# Patient Record
Sex: Female | Born: 1990 | Race: White | Hispanic: No | Marital: Single | Smoking: Never smoker
Health system: Southern US, Community
[De-identification: ages and names within clinical notes are randomized; demographics above are authoritative.]

## PROBLEM LIST (undated history)

## (undated) DIAGNOSIS — R945 Abnormal results of liver function studies: Secondary | ICD-10-CM

## (undated) DIAGNOSIS — K589 Irritable bowel syndrome without diarrhea: Secondary | ICD-10-CM

## (undated) DIAGNOSIS — M419 Scoliosis, unspecified: Secondary | ICD-10-CM

## (undated) DIAGNOSIS — J45909 Unspecified asthma, uncomplicated: Secondary | ICD-10-CM

## (undated) DIAGNOSIS — K859 Acute pancreatitis without necrosis or infection, unspecified: Secondary | ICD-10-CM

## (undated) DIAGNOSIS — K8020 Calculus of gallbladder without cholecystitis without obstruction: Secondary | ICD-10-CM

## (undated) DIAGNOSIS — R7989 Other specified abnormal findings of blood chemistry: Secondary | ICD-10-CM

## (undated) DIAGNOSIS — T7840XA Allergy, unspecified, initial encounter: Secondary | ICD-10-CM

## (undated) HISTORY — DX: Irritable bowel syndrome, unspecified: K58.9

## (undated) HISTORY — DX: Acute pancreatitis without necrosis or infection, unspecified: K85.90

## (undated) HISTORY — DX: Calculus of gallbladder without cholecystitis without obstruction: K80.20

## (undated) HISTORY — DX: Abnormal results of liver function studies: R94.5

## (undated) HISTORY — DX: Other specified abnormal findings of blood chemistry: R79.89

## (undated) HISTORY — PX: MOLE REMOVAL: SHX2046

## (undated) HISTORY — DX: Unspecified asthma, uncomplicated: J45.909

## (undated) HISTORY — DX: Allergy, unspecified, initial encounter: T78.40XA

## (undated) HISTORY — DX: Scoliosis, unspecified: M41.9

---

## 2016-11-14 ENCOUNTER — Encounter: Payer: Self-pay | Admitting: Nurse Practitioner

## 2016-12-05 ENCOUNTER — Other Ambulatory Visit (INDEPENDENT_AMBULATORY_CARE_PROVIDER_SITE_OTHER): Payer: Managed Care, Other (non HMO)

## 2016-12-05 ENCOUNTER — Encounter: Payer: Self-pay | Admitting: Nurse Practitioner

## 2016-12-05 ENCOUNTER — Ambulatory Visit (INDEPENDENT_AMBULATORY_CARE_PROVIDER_SITE_OTHER): Payer: Managed Care, Other (non HMO) | Admitting: Nurse Practitioner

## 2016-12-05 ENCOUNTER — Encounter (INDEPENDENT_AMBULATORY_CARE_PROVIDER_SITE_OTHER): Payer: Self-pay

## 2016-12-05 VITALS — BP 100/50 | HR 72 | Ht 64.0 in | Wt 133.2 lb

## 2016-12-05 DIAGNOSIS — R079 Chest pain, unspecified: Secondary | ICD-10-CM

## 2016-12-05 DIAGNOSIS — R1013 Epigastric pain: Secondary | ICD-10-CM | POA: Diagnosis not present

## 2016-12-05 DIAGNOSIS — K589 Irritable bowel syndrome without diarrhea: Secondary | ICD-10-CM

## 2016-12-05 LAB — HEPATIC FUNCTION PANEL
ALBUMIN: 4 g/dL (ref 3.5–5.2)
ALT: 29 U/L (ref 0–35)
AST: 16 U/L (ref 0–37)
Alkaline Phosphatase: 41 U/L (ref 39–117)
BILIRUBIN TOTAL: 0.3 mg/dL (ref 0.2–1.2)
Bilirubin, Direct: 0.1 mg/dL (ref 0.0–0.3)
Total Protein: 6.9 g/dL (ref 6.0–8.3)

## 2016-12-05 LAB — CBC WITH DIFFERENTIAL/PLATELET
Basophils Absolute: 0 10*3/uL (ref 0.0–0.1)
Basophils Relative: 0.4 % (ref 0.0–3.0)
EOS PCT: 5.7 % — AB (ref 0.0–5.0)
Eosinophils Absolute: 0.5 10*3/uL (ref 0.0–0.7)
HCT: 37 % (ref 36.0–46.0)
HEMOGLOBIN: 12.5 g/dL (ref 12.0–15.0)
LYMPHS PCT: 25.5 % (ref 12.0–46.0)
Lymphs Abs: 2.4 10*3/uL (ref 0.7–4.0)
MCHC: 33.7 g/dL (ref 30.0–36.0)
MCV: 90 fl (ref 78.0–100.0)
MONOS PCT: 8.5 % (ref 3.0–12.0)
Monocytes Absolute: 0.8 10*3/uL (ref 0.1–1.0)
Neutro Abs: 5.6 10*3/uL (ref 1.4–7.7)
Neutrophils Relative %: 59.9 % (ref 43.0–77.0)
Platelets: 261 10*3/uL (ref 150.0–400.0)
RBC: 4.11 Mil/uL (ref 3.87–5.11)
RDW: 12.6 % (ref 11.5–15.5)
WBC: 9.4 10*3/uL (ref 4.0–10.5)

## 2016-12-05 LAB — LIPASE: Lipase: 31 U/L (ref 11.0–59.0)

## 2016-12-05 MED ORDER — RANITIDINE HCL 150 MG PO TABS: 150.0000 mg | ORAL_TABLET | ORAL | 5 refills | Status: DC

## 2016-12-05 NOTE — Progress Notes (Signed)
HPI:  Sierra Hernandez is a 26 year old female who moved here from [place]. Patient is here for 2 months of upper abdominal/distal chest discomfort associated with nausea and vomiting. She's had about 5 episodes in the last 2 months. Episodes  postprandial, worse with fatty foods. The pain does not radiate,  Episodes lasts up to 2 hours. Her last episode was last Sunday after eating [employer]. Patient took Miralax in elementary school. When her upper IGI symptoms started a couple of months ago patient called her mother who advised she try Miralax.  She has actually felt better but doesn't really know if it is because of the MiraLAX. She takes Aleve but sparingly. Her weight is Hernandez. No family history of gastrointestinal or liver diseases.  Sierra Hernandez was seen by GI in [place] in 2012 for marketed transaminitis, fatigue, joint and muscle aches. Peak AST 263, AST 492. Alk phos, bilirubin were normal. Patient stopped taking minocycline and Suliden for acne and her enzymes improved within 3 months, normalized within 6 months. Enzymes still normal in 2014 but mildly elevated on routine physical 2016. Referred back to GI , saw by Dr. Annette Hernandez with [facility].  Ceruloplasmin mildly elevated at 46 (upper limit of normal 39), AMA positive 1:180 homogenous pattern. Remaining liver studies unremarkable including ferritin, alpha-1 antitrypsin, HBsAg, HCV ab,  AMA, ASMA. Ultrasound showed cholelithiasis.  Referral to Rheumatology was entertained for joint pains and elevated ANA but her symptoms resolved LFTs normalized. .    Past Medical History:  Diagnosis Date  . Asthma   . Elevated LFTs   . Gallstones   . IBS (irritable bowel syndrome)   . Scoliosis     Past Surgical History:  Procedure Laterality Date  . MOLE REMOVAL     x 2 on back   Family History  Problem Relation Age of Onset  . Melanoma Mother   . Hyperlipidemia Father   . Heart disease Maternal  Grandmother   . Arthritis Maternal Grandmother   . Gallstones Maternal Grandfather   . Arthritis Paternal Grandmother   . Thyroid cancer Paternal Grandfather   . Colon cancer Neg Hx   . Stomach cancer Neg Hx   . Pancreatic cancer Neg Hx   . Liver disease Neg Hx    Social History  Substance Use Topics  . Smoking status: Never Smoker  . Smokeless tobacco: Never Used  . Alcohol use Yes     Comment: 1 drink daily   Current Outpatient Prescriptions  Medication Sig Dispense Refill  . cetirizine (ZYRTEC) 10 MG tablet Take 10 mg by mouth daily.    . fluticasone (FLONASE) 50 MCG/ACT nasal spray Place 1 spray into both nostrils daily.    . naproxen sodium (ALEVE) 220 MG tablet Take 220 mg by mouth as needed.    . norgestimate-ethinyl estradiol (ORTHO-CYCLEN,SPRINTEC,PREVIFEM) 0.25-35 MG-MCG tablet Take 1 tablet by mouth daily.    Marland Kitchen POLYETHYLENE GLYCOL 3350 PO Take 1/2 capful daily     No current facility-administered medications for this visit.    Allergies not on file   Review of Systems: Positive for allergy, sinus trouble.  All other systems reviewed and negative except where noted in HPI.    Physical Exam: Ht '5\' 4"'  (1.626 m)   Wt 133 lb 3.2 oz (60.4 kg)   BMI 22.86 kg/m  Constitutional:  Well-developed, white female in no acute distress. Psychiatric: Normal mood and affect. Behavior is normal. EENT: Pupils normal.  Conjunctivae are normal.  No scleral icterus. Neck supple.  Cardiovascular: Normal rate, regular rhythm. No edema Pulmonary/chest: Effort normal and breath sounds normal. No wheezing, rales or rhonchi. Abdominal: Soft, nondistended. Nontender. Bowel sounds active throughout. There are no masses palpable. No hepatomegaly. Lymphadenopathy: No cervical adenopathy noted. Neurological: Alert and oriented to person place and time. Skin: Skin is warm and dry. No rashes noted.   ASSESSMENT AND PLAN:  1. 25 yo female with two-month history of intermittent,  non-radiating epigastric/distal chest discomfort associated with a couple of episodes of vomiting but mainly just nausea. Symptoms mainly postprandial, worse after fatty food intake. She feels fine in between episodes. Ultrasound in 2016 revealed cholelithiasis.  Axon looks okay, abdominal exam is unremarkable. Will obtain CMET, lipase, CBC.  -Trial of Zantac Q am.  -Episodic pain / nausea persists then consider EGD and / or surgical referral  2. History of abnormal transaminases in 2014, worked up extensively in [place]. She had a + ANA, also mildly elevated Ceruloplasmin but remainder of labs unremarkable and LFTs normalized. Cholelithiasis on ultrasound but history and exam wasn't compatible with biliary pain at the time.  -Await LFTs being drawn today  Sierra Sav[place], NP  12/05/2016, 8:49 AM

## 2016-12-05 NOTE — Patient Instructions (Addendum)
If you are age 25 or older, your body mass index should be between 23-30. Your Body mass index is 22.86 kg/m. If this is out of the aforementioned range listed, please consider follow up with your Primary Care Provider.  If you are age 25 or younger, your body mass index should be between 19-25. Your Body mass index is 22.86 kg/m. If this is out of the aformentioned range listed, please consider follow up with your Primary Care Provider.   Your physician has requested that you go to the basement for the following lab work before leaving today: CBC/w diff Lipase Hepatic Function  We have sent the following medications to your pharmacy for you to pick up at your convenience: Zantac  Continue Miralax daily.  Follow up with Willette ClusterPaula Guenther, NP in 3-4 weeks or sooner if ongoing pain.  Please call the office to set up appointment in 3-4 weeks when schedule is available.  Thank you for choosing me and [facility].   Willette ClusterPaula Guenther, NP

## 2016-12-08 NOTE — Progress Notes (Signed)
Excellent consultation note reviewed. This sounds like symptomatic gallbladder disease. Prior liver test elevations may have been transient if she passed a CBD stone. Any event, please arrange for surgical opinion as I think she will benefit from laparoscopic cholecystectomy. Thank you

## 2016-12-11 ENCOUNTER — Telehealth: Payer: Self-pay

## 2016-12-11 NOTE — Telephone Encounter (Signed)
-----   Message from Paula M Guenther, NP sent at 12/09/2016 12:10 AM EDT ----- Beth, please call and see how Citlalic is doing . Dr. Perry agrees that a surgical eval for pain and gallstones is next step.. Can you refer patient and then let her know. Thanks 

## 2016-12-11 NOTE — Telephone Encounter (Signed)
Spoke with pt and she is aware.

## 2016-12-11 NOTE — Telephone Encounter (Signed)
Left message for pt to call back. Records faxed to [facility] for referral.

## 2016-12-12 ENCOUNTER — Telehealth: Payer: Self-pay

## 2016-12-12 NOTE — Telephone Encounter (Signed)
-----   Message from Meredith PelPaula M Guenther, NP sent at 12/09/2016 12:10 AM EDT ----- Sierra Hernandez, please call and see how Sierra Hernandez is doing . Dr. Marina GoodellPerry agrees that a surgical eval for pain and gallstones is next step.. Can you refer patient and then let her know. Thanks

## 2016-12-12 NOTE — Telephone Encounter (Signed)
Pt feeling ok, referred to [facility] for consideration of gallbladder surgery. She knows to expect a call from their office.

## 2016-12-20 ENCOUNTER — Telehealth: Payer: Self-pay

## 2016-12-20 NOTE — Telephone Encounter (Signed)
[facility] appointment with Dr. Dwain SarnaWakefield on 01/02/17 at 9:10, they left message for patient.

## 2017-01-27 DIAGNOSIS — K859 Acute pancreatitis without necrosis or infection, unspecified: Secondary | ICD-10-CM

## 2017-01-27 HISTORY — DX: Acute pancreatitis without necrosis or infection, unspecified: K85.90

## 2017-02-03 ENCOUNTER — Inpatient Hospital Stay (HOSPITAL_COMMUNITY)
Admission: EM | Admit: 2017-02-03 | Discharge: 2017-02-19 | DRG: 417 | Disposition: A | Discharge status: Home / Self Care | Payer: Managed Care, Other (non HMO) | Attending: Internal Medicine | Admitting: Internal Medicine

## 2017-02-03 ENCOUNTER — Encounter (HOSPITAL_COMMUNITY): Payer: Self-pay

## 2017-02-03 DIAGNOSIS — M311 Thrombotic microangiopathy: Secondary | ICD-10-CM | POA: Diagnosis not present

## 2017-02-03 DIAGNOSIS — Z8379 Family history of other diseases of the digestive system: Secondary | ICD-10-CM

## 2017-02-03 DIAGNOSIS — Y848 Other medical procedures as the cause of abnormal reaction of the patient, or of later complication, without mention of misadventure at the time of the procedure: Secondary | ICD-10-CM | POA: Diagnosis not present

## 2017-02-03 DIAGNOSIS — K8062 Calculus of gallbladder and bile duct with acute cholecystitis without obstruction: Secondary | ICD-10-CM | POA: Diagnosis not present

## 2017-02-03 DIAGNOSIS — M7989 Other specified soft tissue disorders: Secondary | ICD-10-CM | POA: Diagnosis not present

## 2017-02-03 DIAGNOSIS — K567 Ileus, unspecified: Secondary | ICD-10-CM | POA: Diagnosis not present

## 2017-02-03 DIAGNOSIS — K219 Gastro-esophageal reflux disease without esophagitis: Secondary | ICD-10-CM | POA: Diagnosis present

## 2017-02-03 DIAGNOSIS — L299 Pruritus, unspecified: Secondary | ICD-10-CM | POA: Diagnosis not present

## 2017-02-03 DIAGNOSIS — J45909 Unspecified asthma, uncomplicated: Secondary | ICD-10-CM | POA: Diagnosis not present

## 2017-02-03 DIAGNOSIS — K859 Acute pancreatitis without necrosis or infection, unspecified: Secondary | ICD-10-CM | POA: Diagnosis not present

## 2017-02-03 DIAGNOSIS — R600 Localized edema: Secondary | ICD-10-CM | POA: Diagnosis not present

## 2017-02-03 DIAGNOSIS — Z6821 Body mass index (BMI) 21.0-21.9, adult: Secondary | ICD-10-CM

## 2017-02-03 DIAGNOSIS — R739 Hyperglycemia, unspecified: Secondary | ICD-10-CM | POA: Diagnosis present

## 2017-02-03 DIAGNOSIS — L709 Acne, unspecified: Secondary | ICD-10-CM | POA: Diagnosis not present

## 2017-02-03 DIAGNOSIS — Z8261 Family history of arthritis: Secondary | ICD-10-CM

## 2017-02-03 DIAGNOSIS — Z419 Encounter for procedure for purposes other than remedying health state, unspecified: Secondary | ICD-10-CM

## 2017-02-03 DIAGNOSIS — Z808 Family history of malignant neoplasm of other organs or systems: Secondary | ICD-10-CM

## 2017-02-03 DIAGNOSIS — Z881 Allergy status to other antibiotic agents status: Secondary | ICD-10-CM | POA: Diagnosis not present

## 2017-02-03 DIAGNOSIS — K8066 Calculus of gallbladder and bile duct with acute and chronic cholecystitis without obstruction: Principal | ICD-10-CM | POA: Diagnosis present

## 2017-02-03 DIAGNOSIS — R7989 Other specified abnormal findings of blood chemistry: Secondary | ICD-10-CM

## 2017-02-03 DIAGNOSIS — K9189 Other postprocedural complications and disorders of digestive system: Secondary | ICD-10-CM | POA: Diagnosis not present

## 2017-02-03 DIAGNOSIS — R188 Other ascites: Secondary | ICD-10-CM

## 2017-02-03 DIAGNOSIS — R1011 Right upper quadrant pain: Secondary | ICD-10-CM

## 2017-02-03 DIAGNOSIS — R945 Abnormal results of liver function studies: Secondary | ICD-10-CM | POA: Diagnosis present

## 2017-02-03 DIAGNOSIS — K8050 Calculus of bile duct without cholangitis or cholecystitis without obstruction: Secondary | ICD-10-CM

## 2017-02-03 DIAGNOSIS — Z8349 Family history of other endocrine, nutritional and metabolic diseases: Secondary | ICD-10-CM

## 2017-02-03 DIAGNOSIS — Z8489 Family history of other specified conditions: Secondary | ICD-10-CM | POA: Diagnosis not present

## 2017-02-03 DIAGNOSIS — R112 Nausea with vomiting, unspecified: Secondary | ICD-10-CM

## 2017-02-03 DIAGNOSIS — R3129 Other microscopic hematuria: Secondary | ICD-10-CM | POA: Diagnosis not present

## 2017-02-03 DIAGNOSIS — Z8249 Family history of ischemic heart disease and other diseases of the circulatory system: Secondary | ICD-10-CM | POA: Diagnosis not present

## 2017-02-03 DIAGNOSIS — G4700 Insomnia, unspecified: Secondary | ICD-10-CM | POA: Diagnosis not present

## 2017-02-03 DIAGNOSIS — Z79899 Other long term (current) drug therapy: Secondary | ICD-10-CM

## 2017-02-03 DIAGNOSIS — K659 Peritonitis, unspecified: Secondary | ICD-10-CM | POA: Diagnosis not present

## 2017-02-03 DIAGNOSIS — K589 Irritable bowel syndrome without diarrhea: Secondary | ICD-10-CM | POA: Diagnosis present

## 2017-02-03 DIAGNOSIS — E8770 Fluid overload, unspecified: Secondary | ICD-10-CM | POA: Diagnosis not present

## 2017-02-03 DIAGNOSIS — E876 Hypokalemia: Secondary | ICD-10-CM | POA: Diagnosis not present

## 2017-02-03 DIAGNOSIS — E43 Unspecified severe protein-calorie malnutrition: Secondary | ICD-10-CM | POA: Insufficient documentation

## 2017-02-03 DIAGNOSIS — R140 Abdominal distension (gaseous): Secondary | ICD-10-CM

## 2017-02-03 DIAGNOSIS — K838 Other specified diseases of biliary tract: Secondary | ICD-10-CM

## 2017-02-03 DIAGNOSIS — E8809 Other disorders of plasma-protein metabolism, not elsewhere classified: Secondary | ICD-10-CM | POA: Diagnosis not present

## 2017-02-03 DIAGNOSIS — K8020 Calculus of gallbladder without cholecystitis without obstruction: Secondary | ICD-10-CM | POA: Diagnosis present

## 2017-02-03 DIAGNOSIS — E871 Hypo-osmolality and hyponatremia: Secondary | ICD-10-CM | POA: Diagnosis present

## 2017-02-03 DIAGNOSIS — K819 Cholecystitis, unspecified: Secondary | ICD-10-CM | POA: Diagnosis present

## 2017-02-03 DIAGNOSIS — M545 Low back pain: Secondary | ICD-10-CM | POA: Diagnosis present

## 2017-02-03 DIAGNOSIS — R740 Nonspecific elevation of levels of transaminase and lactic acid dehydrogenase [LDH]: Secondary | ICD-10-CM | POA: Diagnosis present

## 2017-02-03 DIAGNOSIS — K8070 Calculus of gallbladder and bile duct without cholecystitis without obstruction: Secondary | ICD-10-CM | POA: Diagnosis present

## 2017-02-03 DIAGNOSIS — Z791 Long term (current) use of non-steroidal anti-inflammatories (NSAID): Secondary | ICD-10-CM

## 2017-02-03 LAB — CBC
HCT: 39.7 % (ref 36.0–46.0)
HEMOGLOBIN: 13.3 g/dL (ref 12.0–15.0)
MCH: 29.8 pg (ref 26.0–34.0)
MCHC: 33.5 g/dL (ref 30.0–36.0)
MCV: 89 fL (ref 78.0–100.0)
PLATELETS: 294 10*3/uL (ref 150–400)
RBC: 4.46 MIL/uL (ref 3.87–5.11)
RDW: 12.3 % (ref 11.5–15.5)
WBC: 8.4 10*3/uL (ref 4.0–10.5)

## 2017-02-03 LAB — COMPREHENSIVE METABOLIC PANEL
ALK PHOS: 128 U/L — AB (ref 38–126)
ALT: 529 U/L — AB (ref 14–54)
ANION GAP: 12 (ref 5–15)
AST: 320 U/L — ABNORMAL HIGH (ref 15–41)
Albumin: 4.1 g/dL (ref 3.5–5.0)
BUN: 6 mg/dL (ref 6–20)
CALCIUM: 9.3 mg/dL (ref 8.9–10.3)
CO2: 20 mmol/L — AB (ref 22–32)
CREATININE: 0.75 mg/dL (ref 0.44–1.00)
Chloride: 105 mmol/L (ref 101–111)
GFR calc Af Amer: >60 mL/min (ref >60)
GFR calc non Af Amer: >60 mL/min (ref >60)
Glucose, Bld: 91 mg/dL (ref 65–99)
Potassium: 3.5 mmol/L (ref 3.5–5.1)
SODIUM: 137 mmol/L (ref 135–145)
TOTAL PROTEIN: 7.7 g/dL (ref 6.5–8.1)
Total Bilirubin: 5.5 mg/dL — ABNORMAL HIGH (ref 0.3–1.2)

## 2017-02-03 LAB — URINALYSIS, ROUTINE W REFLEX MICROSCOPIC
GLUCOSE, UA: NEGATIVE mg/dL
Hgb urine dipstick: NEGATIVE
Ketones, ur: 80 mg/dL — AB
LEUKOCYTES UA: NEGATIVE
NITRITE: NEGATIVE
PROTEIN: 100 mg/dL — AB
Specific Gravity, Urine: 1.02 (ref 1.005–1.030)
pH: 5 (ref 5.0–8.0)

## 2017-02-03 LAB — I-STAT BETA HCG BLOOD, ED (MC, WL, AP ONLY)

## 2017-02-03 LAB — LIPASE, BLOOD: LIPASE: 40 U/L (ref 11–51)

## 2017-02-03 MED ORDER — SODIUM CHLORIDE 0.9 % IV BOLUS (SEPSIS)
1000.0000 mL | Freq: Once | INTRAVENOUS | Status: AC
  Administered 2017-02-03: 1000 mL via INTRAVENOUS

## 2017-02-03 MED ORDER — METRONIDAZOLE IN NACL 5-0.79 MG/ML-% IV SOLN
500.0000 mg | Freq: Three times a day (TID) | INTRAVENOUS | Status: DC
  Administered 2017-02-03 – 2017-02-07 (×11): 500 mg via INTRAVENOUS
  Filled 2017-02-03 (×10): qty 100

## 2017-02-03 MED ORDER — ENOXAPARIN SODIUM 40 MG/0.4ML ~~LOC~~ SOLN
40.0000 mg | SUBCUTANEOUS | Status: DC
  Administered 2017-02-03: 40 mg via SUBCUTANEOUS
  Filled 2017-02-03: qty 0.4

## 2017-02-03 MED ORDER — MORPHINE SULFATE (PF) 4 MG/ML IV SOLN
4.0000 mg | Freq: Once | INTRAVENOUS | Status: AC
  Administered 2017-02-03: 4 mg via INTRAVENOUS
  Filled 2017-02-03: qty 1

## 2017-02-03 MED ORDER — CEFTRIAXONE SODIUM 2 G IJ SOLR
2.0000 g | INTRAMUSCULAR | Status: DC
  Administered 2017-02-03 – 2017-02-06 (×4): 2 g via INTRAVENOUS
  Filled 2017-02-03 (×5): qty 2

## 2017-02-03 MED ORDER — ONDANSETRON HCL 4 MG/2ML IJ SOLN
4.0000 mg | Freq: Four times a day (QID) | INTRAMUSCULAR | Status: DC | PRN
  Administered 2017-02-04 – 2017-02-07 (×4): 4 mg via INTRAVENOUS
  Filled 2017-02-03 (×5): qty 2

## 2017-02-03 MED ORDER — ONDANSETRON HCL 4 MG/2ML IJ SOLN
4.0000 mg | Freq: Once | INTRAMUSCULAR | Status: AC
  Administered 2017-02-03: 4 mg via INTRAVENOUS
  Filled 2017-02-03: qty 2

## 2017-02-03 MED ORDER — ENOXAPARIN SODIUM 40 MG/0.4ML ~~LOC~~ SOLN: 40.0000 mg | SUBCUTANEOUS | Status: DC

## 2017-02-03 MED ORDER — LACTATED RINGERS IV SOLN
INTRAVENOUS | Status: DC
  Administered 2017-02-03 – 2017-02-07 (×7): via INTRAVENOUS

## 2017-02-03 NOTE — ED Notes (Signed)
Family at bedside. 

## 2017-02-03 NOTE — Progress Notes (Signed)
Pt arrived to [address]. Admission notified. Pt alert and oriented x 4. Ambulated from stretcher to bed. Pt identified appropriately. VS stable, no signs of acute distress, no c/o pain at this time. Pt oriented to room and equipment advised about valuable policy and instructed to use call bell for assistance. Call bell left within reach. Will continue to monitor and treat pt per MD orders.

## 2017-02-03 NOTE — ED Triage Notes (Signed)
Pt presents to the ed with right upper quadrant pain, the patient has known gallstones she is supposed to have removed on Wednesday however the pain is getting worse so they advised her to come to the ER.

## 2017-02-03 NOTE — H&P (Signed)
   Date: 02/03/2017               Patient Name:  Sierra Hernandez MRN: [id number]  DOB: 08/30/1990 Age / Sex: 25 y.o., female   PCP: System, Pcp Not In         Medical Service: Internal Medicine Teaching Service         Attending Physician: Dr. Granfortuna, James M, MD    First Contact: Dr. Harden Pager: [phone]  Second Contact: Dr. Amin Pager: [phone]       After Hours (After 5p/  First Contact Pager: [phone]  weekends / holidays): Second Contact Pager: [phone]   Chief Complaint: Abdominal Pain  History of Present Illness:  25 yo female with PMHx of cholelithiasis, asthma, transient elevated LFTs, and IBS presents with gradually worsening RUQ abdominal with associated nausea. Four days prior to admission she had nausea with associated loss of appetite. The following day she began experiencing itching in the palms of her hands and soles of her feet. The day prior to admission she had severe right upper quadrant pain localized beneath her ribs that radiated upward toward her chest. It was at first sharp in quality and severe. It lasted for several hours and then became more tolerable and was a dull ache. She also had associated right sided back pain, dark colored urine, and diffuse skin itching.   She has a history of cholelithiasis and is scheduled for elective laparoscopic cholecystectomy on 02/07/2017. When she began experiencing symptoms she called [facility] who advised her to eat a liquid diet and see how she felt and call them back the next day. She was unable to get in touch with them today and called [facility], who advised her to come to emergency department. In the emergency department she had one episode of non-bloody emesis. After she vomited she said the abdominal pain and nausea improved, and has since felt okay. She states her itching has improved.   In the ED, she was afebrile with BP 114/73, HR 74, sating 100% on RA. Pertinent labs include: WBC 8.4, Hgb 13.3,  Creatinine 0.75, Total bili 5.5, Alk phos 128, AST 320, ALT 529. She received one dose of Zofran and 4 mg of IV morphine.  Meds:  Current Meds  Medication Sig  . cetirizine (ZYRTEC) 10 MG tablet Take 10 mg by mouth at bedtime.   . EPINEPHrine (EPIPEN 2-PAK) 0.3 mg/0.3 mL IJ SOAJ injection Inject 0.3 mg into the muscle once as needed (severe allergic reaction).  . fluticasone (FLONASE) 50 MCG/ACT nasal spray Place 1 spray into both nostrils daily as needed (seasonal allergies).   . ibuprofen (ADVIL,MOTRIN) 200 MG tablet Take 200 mg by mouth 2 (two) times daily as needed for headache (pain).  . Multiple Vitamin (MULTIVITAMIN WITH MINERALS) TABS tablet Take 1 tablet by mouth daily.  . naproxen sodium (ALEVE) 220 MG tablet Take 220-440 mg by mouth 2 (two) times daily as needed (pain/headache).   . norgestimate-ethinyl estradiol (ORTHO-CYCLEN,SPRINTEC,PREVIFEM) 0.25-35 MG-MCG tablet Take 1 tablet by mouth at bedtime.   . polyethylene glycol (MIRALAX / GLYCOLAX) packet Take 8.5 g by mouth daily as needed (constipation). Mix in 8 oz liquid and drink     Allergies: Allergies as of 02/03/2017 - Review Complete 02/03/2017  Allergen Reaction Noted  . Minocycline Swelling and Other (See Comments) 12/05/2016   Past Medical History:  Diagnosis Date  . Asthma   . Elevated LFTs   . Gallstones   . IBS (  irritable bowel syndrome)   . Scoliosis     Family History:  Family History  Problem Relation Age of Onset  . Melanoma Mother   . Hyperlipidemia Father   . Heart disease Maternal Grandmother   . Arthritis Maternal Grandmother   . Gallstones Maternal Grandfather   . Arthritis Paternal Grandmother   . Thyroid cancer Paternal Grandfather   . Colon cancer Neg Hx   . Stomach cancer Neg Hx   . Pancreatic cancer Neg Hx   . Liver disease Neg Hx     Social History:   Social History Main Topics  . Smoking status: Never Smoker  . Smokeless tobacco: Never Used  . Alcohol use Yes     Comment: ~2x  per month  . Drug use: No   Review of Systems: A complete ROS was negative except as per HPI.   Physical Exam: Blood pressure 113/60, pulse 68, temperature 97.7 F (36.5 C), temperature source Oral, resp. rate 16, last menstrual period 01/12/2017, SpO2 100 %.  General: Laying in bed comfortably, NAD HEENT: [place]/AT, EOMI, no scleral icterus, PERRL Cardiac: RRR, No R/M/G appreciated Pulm: normal effort, CTAB Abd: soft, Mild TTP in epigastric region, non distended, BS normal Ext: extremities well perfused, no peripheral edema Neuro: alert and oriented X3, cranial nerves II-XII grossly intact Skin: minimal jaundice   EKG: none to review  CXR: none to review  Assessment & Plan by Problem: Active Problems:   Cholecystitis   Choledocholithiasis  Choledocholithiasis Patient has history of cholelithiasis and transiently elevated LFTS in the past. In 2012 she had joint aches and fatigue with markedly elevated LFTS thought to be due to an adverse reaction to minocycline. Peak AST 263, AST 492. Alk phos, bilirubin normal. Patient stopped taking minocycline and Suliden for acne and her enzymes improved within 3 months, and then normalized within 6 months. Her liver enzymes were found again to be elevated on routine follow up in May 2016. Subsequently, in October 2016 she got an abdominal U/S for further evaluation and was found to have cholelithiasis. Remaining liver studies unremarkable including ferritin, alpha-1 antitrypsin, HBsAg, HCV ab,  AMA, ASMA. She recently has been following with [facility] after experiencing several months of intermittent post prandial N/V, abdominal pain, and chest discomfort. As of 12/05/2016 LFTs were WNL: AST 16, ALT 19, ALK phos 41, Bili 0.3. This admission Total bili 5.5, Alk phos 128, AST 320, ALT 529. Less likely acute cholecystitis at this time. The patient is afebrile with no white count. Abdominal exam was rather benign, but the patient received 4 mg of morphine  in the ED. -General surgery on board>>pending LFTs in AM may go for surgery tomorrow -Started IV ceftriaxone and flagyl -IVF: LRs 50 cc/hr -CMET and CBC in AM -Zofran PRN for nausea  -Diet: NPO  Dispo: Admit patient to Observation with expected length of stay less than 2 midnights.  Signed: Lacroce, Samantha J, MD 02/03/2017, 10:41 PM  Pager: [phone] 

## 2017-02-03 NOTE — ED Notes (Signed)
Surgeon ar the bedside.

## 2017-02-03 NOTE — ED Provider Notes (Signed)
[facility] DEPT Provider Note   CSN: [id number] Arrival date & time: 02/03/17  1320     History   Chief Complaint Chief Complaint  Patient presents with  . Abdominal Pain    HPI Sierra Hernandez is a 26 y.o. female with a PMHx of asthma, cholelithiasis, elevated LFTs, IBS, and scoliosis, who presents to the ED with complaints of gradually worsening RUQ abdominal pain that started worsening yesterday, and nausea x3 days. Patient states that she is scheduled for a cholecystectomy this coming Wednesday with Dr. [name] Hazel of [facility] (of note, no U/S since 02/2015 which showed cholelithiasis, was referred to surgery by her [facility] provider Nevin Bloodgood, and scheduled for surgery without any repeat U/S being done), however 3 days ago she started having worsening nausea, and yesterday she developed worsening RUQ pain. She also noticed that she has been having itching of her palms, soles, and legs, as well as very bright yellow urine, and her mother has noticed some scleral icterus. She called the on-call provider for without or GI who recommended that she come to the emergency room for evaluation. She describes her pain as 9/10 constant waxing and waning dull and tight right upper quadrant and epigastric pain that radiates into her upper back, with no known aggravating factors, and mildly relieved with Advil as well as vomiting. She just vomited upon arrival to the ED, had one episode of nonbloody nonbilious emesis, and has not had any emesis since then nor has she had any emesis prior to that. She states that now that she vomited she feels a little better. She has only been having clear liquids since yesterday, her last food intake was at 11 AM yesterday but otherwise she's had water and chicken broth since then, last intake of that was at 11:30am today. She has a PCP in [place], none here.   She denies fevers, chills, CP, SOB, diarrhea/constipation, obstipation, melena, hematochezia, hematemesis, hematuria,  dysuria, vaginal bleeding/discharge, myalgias, arthralgias, numbness, tingling, focal weakness, or any other complaints at this time. Denies recent travel, sick contacts, suspicious food intake, EtOH use, or prior abd surgeries. Admits to occasional NSAID use approx 1-2x/month.    The history is provided by the patient and medical records. No language interpreter was used.  Abdominal Pain   This is a recurrent problem. The current episode started yesterday. The problem occurs constantly. The problem has been gradually worsening. Associated with: known gallstones. The pain is located in the RUQ and epigastric region. The quality of the pain is dull (and tightness). The pain is at a severity of 9/10. The pain is moderate. Associated symptoms include nausea and vomiting. Pertinent negatives include fever, diarrhea, flatus, hematochezia, melena, constipation, dysuria, hematuria, arthralgias and myalgias. Nothing aggravates the symptoms. The symptoms are relieved by vomiting and NSAIDs. Her past medical history is significant for gallstones.    Past Medical History:  Diagnosis Date  . Asthma   . Elevated LFTs   . Gallstones   . IBS (irritable bowel syndrome)   . Scoliosis     There are no active problems to display for this patient.   Past Surgical History:  Procedure Laterality Date  . MOLE REMOVAL     x 2 on back    OB History    No data available       Home Medications    Prior to Admission medications   Medication Sig Start Date End Date Taking? Authorizing Provider  cetirizine (ZYRTEC) 10 MG tablet Take 10 mg by  mouth daily.    [provider]  fluticasone (FLONASE) 50 MCG/ACT nasal spray Place 1 spray into both nostrils daily.    [provider]  naproxen sodium (ALEVE) 220 MG tablet Take 220 mg by mouth as needed.    [provider]  norgestimate-ethinyl estradiol (ORTHO-CYCLEN,SPRINTEC,PREVIFEM) 0.25-35 MG-MCG tablet Take 1 tablet by mouth daily.     [provider]  POLYETHYLENE GLYCOL 3350 PO Take 1/2 capful daily    [provider]  ranitidine (ZANTAC) 150 MG tablet Take 1 tablet (150 mg total) by mouth every morning. Before breakfast 12/05/16   Willia Craze, NP    Family History Family History  Problem Relation Age of Onset  . Melanoma Mother   . Hyperlipidemia Father   . Heart disease Maternal Grandmother   . Arthritis Maternal Grandmother   . Gallstones Maternal Grandfather   . Arthritis Paternal Grandmother   . Thyroid cancer Paternal Grandfather   . Colon cancer Neg Hx   . Stomach cancer Neg Hx   . Pancreatic cancer Neg Hx   . Liver disease Neg Hx     Social History Social History  Substance Use Topics  . Smoking status: Never Smoker  . Smokeless tobacco: Never Used  . Alcohol use Yes     Comment: 1 drink daily     Allergies   Minocycline   Review of Systems Review of Systems  Constitutional: Negative for chills and fever.  Eyes:       +yellowing of eyes  Respiratory: Negative for shortness of breath.   Cardiovascular: Negative for chest pain.  Gastrointestinal: Positive for abdominal pain, nausea and vomiting. Negative for blood in stool, constipation, diarrhea, flatus, hematochezia and melena.  Genitourinary: Negative for dysuria, hematuria, vaginal bleeding and vaginal discharge.       +bright yellow urine  Musculoskeletal: Negative for arthralgias and myalgias.  Skin: Positive for rash (itchy palms/soles and legs).  Allergic/Immunologic: Negative for immunocompromised state.  Neurological: Negative for weakness and numbness.  Psychiatric/Behavioral: Negative for confusion.   All other systems reviewed and are negative for acute change except as noted in the HPI.    Physical Exam Updated Vital Signs BP 114/73   Pulse 74   Temp 98.1 F (36.7 C) (Oral)   Resp 16   LMP 01/12/2017   SpO2 100%   Physical Exam  Constitutional: She is oriented to person, place, and time.  Vital signs are normal. She appears well-developed and well-nourished.  Non-toxic appearance. No distress.  Afebrile, nontoxic, NAD  HENT:  Head: Normocephalic and atraumatic.  Mouth/Throat: Oropharynx is clear and moist and mucous membranes are normal.  Eyes: Conjunctivae and EOM are normal. Right eye exhibits no discharge. Left eye exhibits no discharge. Scleral icterus is present.  Scleral icterus  Neck: Normal range of motion. Neck supple.  Cardiovascular: Normal rate, regular rhythm, normal heart sounds and intact distal pulses.  Exam reveals no gallop and no friction rub.   No murmur heard. Pulmonary/Chest: Effort normal and breath sounds normal. No respiratory distress. She has no decreased breath sounds. She has no wheezes. She has no rhonchi. She has no rales.  Abdominal: Soft. Normal appearance and bowel sounds are normal. She exhibits no distension. There is tenderness in the right upper quadrant and epigastric area. There is positive Murphy's sign. There is no rigidity, no rebound, no guarding, no CVA tenderness and no tenderness at McBurney's point.  Soft, nondistended, +BS throughout, with moderate RUQ/epigastric TTP, no r/g/r, +murphy's, neg  mcburney's, no CVA TTP   Musculoskeletal: Normal range of motion.  Neurological: She is alert and oriented to person, place, and time. She has normal strength. No sensory deficit.  Skin: Skin is warm, dry and intact. No rash noted.  Slightly jaundiced skin, no rashes but pt scratching multiple areas of body  Psychiatric: She has a normal mood and affect.  Nursing note and vitals reviewed.    ED Treatments / Results  Labs (all labs ordered are listed, but only abnormal results are displayed) Labs Reviewed  COMPREHENSIVE METABOLIC PANEL - Abnormal; Notable for the following:       Result Value   CO2 20 (*)    AST 320 (*)    ALT 529 (*)    Alkaline Phosphatase 128 (*)    Total Bilirubin 5.5 (*)    All other components within normal  limits  URINALYSIS, ROUTINE W REFLEX MICROSCOPIC - Abnormal; Notable for the following:    Color, Urine AMBER (*)    Bilirubin Urine MODERATE (*)    Ketones, ur 80 (*)    Protein, ur 100 (*)    Bacteria, UA RARE (*)    Squamous Epithelial / LPF 0-5 (*)    All other components within normal limits  LIPASE, BLOOD  CBC  I-STAT BETA HCG BLOOD, ED ([facility], [facility], [facility] ONLY)    EKG  EKG Interpretation None       Radiology No results found.   US Abdomen Complete 03/19/2015: Result Narrative  [id number]--Attending TG:GYIRSW Letta Median [id number]--[id number] WE:XHBZJI Letta Median Date of Birth:July 16, 1990 Sex: F Admit Date:03/19/2015 08:55    INDICATIONS: ABNORMAL LIVER ELEVATED LFT&#39;S COMMENTS: PROCEDURE:QUS 2000- [place] ABD COMP - Mar 19 2015  [employer] Accession #: [id number] [facility] Accession #: [id number]   INTERPRETATION: GRAYSCALE ULTRASOUND OF THE ABDOMEN:  TECHNIQUE: Multiple grayscale ultrasound images were obtained of the  abdomen.   ADDITIONAL CLINICAL INFORMATION: Abnormal enzymes.  COMPARISON: None available  FINDINGS:  PANCREAS: No abnormality visualized. LIVER: Normal echotexture without focal abnormality. PORTAL VEIN: Normal flow direction. GALLBLADDER: Cholelithiasis is noted with a mobile 2 cm gallstone.  Patient is not tender over the gallbladder. BILE DUCTS: Normal caliber. The common duct is 3 mm.. SPLEEN: Normal size and echotexture. RIGHT KIDNEY: Normal size. No hydronephrosis.  LEFT KIDNEY: Normal size. No hydronephrosis. AORTA AND INFERIOR VENA CAVA: Normal where visualized. ADDITIONAL FINDINGS: None. No ascites.   CONCLUSION:   Cholelithiasis. No liver abnormality is noted.     ___________________________________________________________ Read ID:POEUM E FEE, [id number] on Oct 21 [id number] [facility]. Transcribed by: [facility] on Oct 21 20169:50A Signed by:DR. Richardo Priest, MD on:Oct 21 20169:50A      Procedures Procedures (including critical care time)  Medications Ordered in ED Medications  morphine 4 MG/ML injection 4 mg (4 mg Intravenous Given 02/03/17 1801)  sodium chloride 0.9 % bolus 1,000 mL (1,000 mLs Intravenous New Bag/Given 02/03/17 1801)  ondansetron (ZOFRAN) injection 4 mg (4 mg Intravenous Given 02/03/17 1801)     Initial Impression / Assessment and Plan / ED Course  I have reviewed the triage vital signs and the nursing notes.  Pertinent labs & imaging results that were available during my care of the patient were reviewed by me and considered in my medical decision making (see chart for details).     26 y.o. female here with worsening RUQ pain x1 day, n/v, and itching; scheduled for cholecystectomy on Wednesday but due to worsening symptoms, was advised to come to the ED for further evaluation.  Hasn't had any recent U/S imaging, last time was 02/2015 in [place]. On exam, scleral icterus, slightly jaundiced skin, RUQ/epigastric TTP with +murphy's, but nonperitoneal otherwise. Work up thus far shows: lipase WNL, CBC WNL, CMP with AST 320, ALT 529, Alk phos 128, bili 5.5 (just had labs 01/15/17 which showed normal levels of all of these except ALT 39). Awaiting U/A, will get betaHCG as well. Clinically it seems she has choledocholithiasis, will touch base with surgery to discuss whether they would like imaging or if they would prefer to proceed with cholecystectomy without imaging. Will give fluids, pain meds, nausea meds, and reassess shortly  5:49 PM Dr. Kae Heller of gen surgery returning page, doesn't feel we need any repeat imaging at this point, states she will need her gallbladder out once her LFTs improve, recommends admission to medical service and stated they may want to consult GI to discuss possible MRCP possibility prior to surgery; but states [facility] service will consult on pt while she's here. Will await BetaHCG before admitting to ensure no pregnancy that could  potentially change who we need to consult now. Will reassess shortly.   6:34 PM U/A with moderate bili as expected, 0-5 squamous and nitrite/leuk neg, rare bacteria; likely contaminant, doubt UTI. BetaHCG neg. Will proceed with admission. Of note, Dr. Kae Heller of surgery already down to see pt and discussed plan with her and the family as well. Pt comfortable at this time, denies any further requests; understands plan and agrees.   6:58 PM Dr. Reesa Chew of IM residency service returning page and will admit. Holding orders to be placed by admitting team. Please see their notes for further documentation of care. I appreciate their help with this pleasant pt's care. Pt stable at time of admission.    Final Clinical Impressions(s) / ED Diagnoses   Final diagnoses:  Cholelithiasis with choledocholithiasis  Elevated LFTs  Hyperbilirubinemia  Acute abdominal pain in right upper quadrant  Nausea and vomiting in adult patient    New Prescriptions New Prescriptions   No medications on [address], [place], [place] 02/03/17 1858    Merrily Pew, MD 02/03/17 2041

## 2017-02-03 NOTE — Consult Note (Signed)
Surgical Consultation Requesting provider: Dr. Clayborne Dana  CC: Abdominal pain, jaundice  HPI: 25yo woman with known gallstones presents with worsening pain, nausea and jaundice. She was seen in our office on 8/7 by Dr. Dwain Sarna and is scheduled for lap chole at [facility] on Weds. On Wednesday she began to have nausea and abdominal discomfort and put herself on a bland diet. Friday she had worsening pain under the right medial ribs and was advised to go down to a clear diet. She noticed itching of the soles of her feet and palms of her hands followed by diffuse itching. This morning her pain worsened and she came to the ER (she was unable to get through the phone lines at our office, called [facility] and Dr. Elnoria Howard advised her to come in). Her mother thinks her eyes look yellow compared to baseline. Her urine is tea-colored.  Allergies  Allergen Reactions  . Minocycline     Past Medical History:  Diagnosis Date  . Asthma   . Elevated LFTs   . Gallstones   . IBS (irritable bowel syndrome)   . Scoliosis     Past Surgical History:  Procedure Laterality Date  . MOLE REMOVAL     x 2 on back    Family History  Problem Relation Age of Onset  . Melanoma Mother   . Hyperlipidemia Father   . Heart disease Maternal Grandmother   . Arthritis Maternal Grandmother   . Gallstones Maternal Grandfather   . Arthritis Paternal Grandmother   . Thyroid cancer Paternal Grandfather   . Colon cancer Neg Hx   . Stomach cancer Neg Hx   . Pancreatic cancer Neg Hx   . Liver disease Neg Hx     Social History   Social History  . Marital status: Single    Spouse name: N/A  . Number of children: N/A  . Years of education: N/A   Social History Main Topics  . Smoking status: Never Smoker  . Smokeless tobacco: Never Used  . Alcohol use Yes     Comment: 1 drink daily  . Drug use: No  . Sexual activity: Not Asked   Other Topics Concern  . None   Social History Narrative  . None    No current  facility-administered medications on file prior to encounter.    Current Outpatient Prescriptions on File Prior to Encounter  Medication Sig Dispense Refill  . cetirizine (ZYRTEC) 10 MG tablet Take 10 mg by mouth daily.    . fluticasone (FLONASE) 50 MCG/ACT nasal spray Place 1 spray into both nostrils daily.    . naproxen sodium (ALEVE) 220 MG tablet Take 220 mg by mouth as needed.    . norgestimate-ethinyl estradiol (ORTHO-CYCLEN,SPRINTEC,PREVIFEM) 0.25-35 MG-MCG tablet Take 1 tablet by mouth daily.    Marland Kitchen POLYETHYLENE GLYCOL 3350 PO Take 1/2 capful daily    . ranitidine (ZANTAC) 150 MG tablet Take 1 tablet (150 mg total) by mouth every morning. Before breakfast 30 tablet 5    Review of Systems: a complete, 10pt review of systems was completed with pertinent positives and negatives as documented in the HPI.   Physical Exam: Vitals:   02/03/17 1331 02/03/17 1544  BP: 132/82 114/73  Pulse: 71 74  Resp: 18 16  Temp: 98.1 F (36.7 C)   SpO2: 100% 100%   Gen: A&Ox3, no distress  Head: normocephalic, atraumatic, EOMI, very mild scleral icterus.  Neck: supple without mass or thyromegaly Chest: unlabored respirations clear bilaterall  Cardiovascular: RRR  with palpable distal pulses Abdomen: soft, mildly distended, mildly tender in right subcostal margin Extremities: warm, without edema, no deformities  Neuro: grossly intact Psych: appropriate mood and affect , normal insight Skin: warm and dry, jaundice is really not prominent   CBC Latest Ref Rng & Units 02/03/2017 12/05/2016  WBC 4.0 - 10.5 K/uL 8.4 9.4  Hemoglobin 12.0 - 15.0 g/dL 35.713.3 01.712.5  Hematocrit 79.336.0 - 46.0 % 39.7 37.0  Platelets 150 - 400 K/uL 294 261.0    CMP Latest Ref Rng & Units 02/03/2017 12/05/2016  Glucose 65 - 99 mg/dL 91 -  BUN 6 - 20 mg/dL 6 -  Creatinine 9.030.44 - 1.00 mg/dL 0.090.75 -  Sodium 233135 - 007145 mmol/L 137 -  Potassium 3.5 - 5.1 mmol/L 3.5 -  Chloride 101 - 111 mmol/L 105 -  CO2 22 - 32 mmol/L 20(L) -   Calcium 8.9 - 10.3 mg/dL 9.3 -  Total Protein 6.5 - 8.1 g/dL 7.7 6.9  Total Bilirubin 0.3 - 1.2 mg/dL 5.5(H) 0.3  Alkaline Phos 38 - 126 U/L 128(H) 41  AST 15 - 41 U/L 320(H) 16  ALT 14 - 54 U/L 529(H) 29    No results found for: INR, PROTIME  Imaging: She had an US in [place] which Dr. Dwain SarnaWakefield was able to review in the office, his note states stones are present  A/P: 25yo woman with likely choledocholithiasis -Recommend admission for fluid resusciation, empiric antibiotics, pain and nausea control -NPO  -GI consult -Repeat LFTs in AM, if downtrending will proceed with laparoscopic cholecystectomy either this admission or on Wednesday as planned   Phylliss Blakeshelsea Lovetta Condie, MD [facility], [place] Pager [phone]

## 2017-02-04 ENCOUNTER — Observation Stay (HOSPITAL_COMMUNITY): Payer: Managed Care, Other (non HMO)

## 2017-02-04 DIAGNOSIS — Z9049 Acquired absence of other specified parts of digestive tract: Secondary | ICD-10-CM | POA: Diagnosis not present

## 2017-02-04 DIAGNOSIS — K659 Peritonitis, unspecified: Secondary | ICD-10-CM | POA: Diagnosis not present

## 2017-02-04 DIAGNOSIS — J45909 Unspecified asthma, uncomplicated: Secondary | ICD-10-CM | POA: Diagnosis present

## 2017-02-04 DIAGNOSIS — Z6821 Body mass index (BMI) 21.0-21.9, adult: Secondary | ICD-10-CM | POA: Diagnosis not present

## 2017-02-04 DIAGNOSIS — Z791 Long term (current) use of non-steroidal anti-inflammatories (NSAID): Secondary | ICD-10-CM | POA: Diagnosis not present

## 2017-02-04 DIAGNOSIS — R600 Localized edema: Secondary | ICD-10-CM | POA: Diagnosis not present

## 2017-02-04 DIAGNOSIS — E8770 Fluid overload, unspecified: Secondary | ICD-10-CM | POA: Diagnosis not present

## 2017-02-04 DIAGNOSIS — K56600 Partial intestinal obstruction, unspecified as to cause: Secondary | ICD-10-CM | POA: Diagnosis not present

## 2017-02-04 DIAGNOSIS — R5082 Postprocedural fever: Secondary | ICD-10-CM | POA: Diagnosis not present

## 2017-02-04 DIAGNOSIS — R509 Fever, unspecified: Secondary | ICD-10-CM | POA: Diagnosis not present

## 2017-02-04 DIAGNOSIS — R1084 Generalized abdominal pain: Secondary | ICD-10-CM | POA: Diagnosis not present

## 2017-02-04 DIAGNOSIS — K567 Ileus, unspecified: Secondary | ICD-10-CM | POA: Diagnosis not present

## 2017-02-04 DIAGNOSIS — R1011 Right upper quadrant pain: Secondary | ICD-10-CM

## 2017-02-04 DIAGNOSIS — M311 Thrombotic microangiopathy: Secondary | ICD-10-CM | POA: Diagnosis not present

## 2017-02-04 DIAGNOSIS — Z8261 Family history of arthritis: Secondary | ICD-10-CM | POA: Diagnosis not present

## 2017-02-04 DIAGNOSIS — K560 Paralytic ileus: Secondary | ICD-10-CM | POA: Diagnosis not present

## 2017-02-04 DIAGNOSIS — K650 Generalized (acute) peritonitis: Secondary | ICD-10-CM | POA: Diagnosis not present

## 2017-02-04 DIAGNOSIS — E871 Hypo-osmolality and hyponatremia: Secondary | ICD-10-CM | POA: Diagnosis present

## 2017-02-04 DIAGNOSIS — K9189 Other postprocedural complications and disorders of digestive system: Secondary | ICD-10-CM | POA: Diagnosis not present

## 2017-02-04 DIAGNOSIS — K8050 Calculus of bile duct without cholangitis or cholecystitis without obstruction: Secondary | ICD-10-CM | POA: Diagnosis not present

## 2017-02-04 DIAGNOSIS — R633 Feeding difficulties: Secondary | ICD-10-CM | POA: Diagnosis not present

## 2017-02-04 DIAGNOSIS — Z79899 Other long term (current) drug therapy: Secondary | ICD-10-CM | POA: Diagnosis not present

## 2017-02-04 DIAGNOSIS — N9089 Other specified noninflammatory disorders of vulva and perineum: Secondary | ICD-10-CM | POA: Diagnosis not present

## 2017-02-04 DIAGNOSIS — K8070 Calculus of gallbladder and bile duct without cholecystitis without obstruction: Secondary | ICD-10-CM | POA: Diagnosis not present

## 2017-02-04 DIAGNOSIS — R945 Abnormal results of liver function studies: Secondary | ICD-10-CM | POA: Diagnosis present

## 2017-02-04 DIAGNOSIS — K851 Biliary acute pancreatitis without necrosis or infection: Secondary | ICD-10-CM | POA: Diagnosis not present

## 2017-02-04 DIAGNOSIS — Z8349 Family history of other endocrine, nutritional and metabolic diseases: Secondary | ICD-10-CM | POA: Diagnosis not present

## 2017-02-04 DIAGNOSIS — K8066 Calculus of gallbladder and bile duct with acute and chronic cholecystitis without obstruction: Secondary | ICD-10-CM | POA: Diagnosis present

## 2017-02-04 DIAGNOSIS — R112 Nausea with vomiting, unspecified: Secondary | ICD-10-CM | POA: Diagnosis not present

## 2017-02-04 DIAGNOSIS — Z808 Family history of malignant neoplasm of other organs or systems: Secondary | ICD-10-CM | POA: Diagnosis not present

## 2017-02-04 DIAGNOSIS — K589 Irritable bowel syndrome without diarrhea: Secondary | ICD-10-CM | POA: Diagnosis present

## 2017-02-04 DIAGNOSIS — R7989 Other specified abnormal findings of blood chemistry: Secondary | ICD-10-CM | POA: Diagnosis present

## 2017-02-04 DIAGNOSIS — R140 Abdominal distension (gaseous): Secondary | ICD-10-CM | POA: Diagnosis not present

## 2017-02-04 DIAGNOSIS — E876 Hypokalemia: Secondary | ICD-10-CM | POA: Diagnosis not present

## 2017-02-04 DIAGNOSIS — R188 Other ascites: Secondary | ICD-10-CM | POA: Diagnosis not present

## 2017-02-04 DIAGNOSIS — K858 Other acute pancreatitis without necrosis or infection: Secondary | ICD-10-CM | POA: Diagnosis not present

## 2017-02-04 DIAGNOSIS — Z8249 Family history of ischemic heart disease and other diseases of the circulatory system: Secondary | ICD-10-CM | POA: Diagnosis not present

## 2017-02-04 DIAGNOSIS — K8020 Calculus of gallbladder without cholecystitis without obstruction: Secondary | ICD-10-CM | POA: Diagnosis present

## 2017-02-04 DIAGNOSIS — R601 Generalized edema: Secondary | ICD-10-CM | POA: Diagnosis not present

## 2017-02-04 DIAGNOSIS — R740 Nonspecific elevation of levels of transaminase and lactic acid dehydrogenase [LDH]: Secondary | ICD-10-CM | POA: Diagnosis present

## 2017-02-04 DIAGNOSIS — Y848 Other medical procedures as the cause of abnormal reaction of the patient, or of later complication, without mention of misadventure at the time of the procedure: Secondary | ICD-10-CM | POA: Diagnosis not present

## 2017-02-04 DIAGNOSIS — K859 Acute pancreatitis without necrosis or infection, unspecified: Secondary | ICD-10-CM | POA: Diagnosis not present

## 2017-02-04 DIAGNOSIS — Z881 Allergy status to other antibiotic agents status: Secondary | ICD-10-CM | POA: Diagnosis not present

## 2017-02-04 DIAGNOSIS — E43 Unspecified severe protein-calorie malnutrition: Secondary | ICD-10-CM | POA: Diagnosis present

## 2017-02-04 DIAGNOSIS — K8040 Calculus of bile duct with cholecystitis, unspecified, without obstruction: Secondary | ICD-10-CM | POA: Diagnosis not present

## 2017-02-04 LAB — COMPREHENSIVE METABOLIC PANEL
ALBUMIN: 3.1 g/dL — AB (ref 3.5–5.0)
ALT: 562 U/L — AB (ref 14–54)
AST: 356 U/L — AB (ref 15–41)
Alkaline Phosphatase: 104 U/L (ref 38–126)
Anion gap: 12 (ref 5–15)
BUN: 7 mg/dL (ref 6–20)
CHLORIDE: 108 mmol/L (ref 101–111)
CO2: 17 mmol/L — AB (ref 22–32)
Calcium: 8.6 mg/dL — ABNORMAL LOW (ref 8.9–10.3)
Creatinine, Ser: 0.81 mg/dL (ref 0.44–1.00)
GFR calc Af Amer: >60 mL/min (ref >60)
GLUCOSE: 62 mg/dL — AB (ref 65–99)
POTASSIUM: 3.8 mmol/L (ref 3.5–5.1)
Sodium: 137 mmol/L (ref 135–145)
Total Bilirubin: 6.2 mg/dL — ABNORMAL HIGH (ref 0.3–1.2)
Total Protein: 6.1 g/dL — ABNORMAL LOW (ref 6.5–8.1)

## 2017-02-04 LAB — CBC
HEMATOCRIT: 36.4 % (ref 36.0–46.0)
Hemoglobin: 11.6 g/dL — ABNORMAL LOW (ref 12.0–15.0)
MCH: 28.8 pg (ref 26.0–34.0)
MCHC: 31.9 g/dL (ref 30.0–36.0)
MCV: 90.3 fL (ref 78.0–100.0)
Platelets: 255 10*3/uL (ref 150–400)
RBC: 4.03 MIL/uL (ref 3.87–5.11)
RDW: 12.6 % (ref 11.5–15.5)
WBC: 7.5 10*3/uL (ref 4.0–10.5)

## 2017-02-04 LAB — HIV ANTIBODY (ROUTINE TESTING W REFLEX): HIV Screen 4th Generation wRfx: NONREACTIVE

## 2017-02-04 MED ORDER — GADOBENATE DIMEGLUMINE 529 MG/ML IV SOLN
15.0000 mL | Freq: Once | INTRAVENOUS | Status: AC
  Administered 2017-02-04: 14 mL via INTRAVENOUS

## 2017-02-04 MED ORDER — PROMETHAZINE HCL 25 MG/ML IJ SOLN
12.5000 mg | Freq: Four times a day (QID) | INTRAMUSCULAR | Status: DC | PRN
  Administered 2017-02-06 – 2017-02-07 (×2): 12.5 mg via INTRAVENOUS
  Filled 2017-02-04 (×2): qty 1

## 2017-02-04 NOTE — Progress Notes (Signed)
   Subjective: No acute events overnight, pt reports improved abdominal pain and itching but still present. No improvement in LFTs or bilirubin on morning labs.      Objective:  Vital signs in last 24 hours: Vitals:   02/03/17 2000 02/03/17 2015 02/03/17 2038 02/04/17 0600  BP: 110/66 116/70 113/60 (!) 103/57  Pulse: 64 74 68 76  Resp: _0 Temp:   97.7 F (36.5 C) 98.4 F (36.9 C)  TempSrc:   Oral Oral  SpO2: 100% 99% 100% 99%  Weight:    130 lb 1.6 oz (59 kg)  Height:    _1  (1.626 m)   Physical Exam  Constitutional: She is oriented to person, place, and time.  Resting in bed, no acute distress   HENT:  Head: Normocephalic and atraumatic.  Eyes: Scleral icterus is present.  Cardiovascular: Normal rate, regular rhythm and normal heart sounds.   Pulmonary/Chest: Effort normal and breath sounds normal. No respiratory distress.  Abdominal: Soft.  Soft, tenderness to palpation to RUQ, improved from prior. Non-tender in other quadrants, no rebound, or guarding   Musculoskeletal: She exhibits no edema.  Neurological: She is alert and oriented to person, place, and time.  Skin: Capillary refill takes less than 2 seconds.  Jaundice      Assessment/Plan:  Choledocholithiasis Pt has been in the process of evaluation for symptomatic cholelithiasis with elective cholecystectomy scheduled for 9/12. She developed worsening pain, jaundice and found to have elevated bilirubin, Alk Phos and transaminases (T. bili 5.5, AlkP 128, AST 320, ALT 529). She is afebrile without white count, cholecystitis less likely though not excluded. Lab values and clinical presentation is consistent with choledocholithiasis causing obstruction. She denies a history of anemia or known hemolysis in herself or family. General surgery consulted and will likely need laparoscopic cholecystectomy for definitive treatment. Today, bili increase to 6.2, AST 356, ALT 562.  --Continue Ceftriaxone, Flagyl  --NPO,  LR at 100 ml/hr --MRCP --CMP, CBC --GI consult  --Zofran 4 mg q6hr prn  Transaminitis Current elevations likely related to obstruction as above. Pt has a history of transient transaminitis attributed to medications (minocycline, suliden for acne) with unremarkable work up (incl  ferritin, alpha-1 antitrypsin, HBsAg, HCV ab, AMA, ASMA)  and resolution after medication discontinuation.  --Monitor as above     Dispo: Anticipated discharge in approximately 2-3 day(s).   Tawny Asal, MD 02/04/2017, 12:35 PM Pager: [phone]

## 2017-02-04 NOTE — Consult Note (Addendum)
Reason for Consult: Elevated liver enzymes, cholelithiasis, and possible choledocholithiasis Referring Physician: Teaching Service  Ripley FraiseAmanda Hernandez HPI: This is a 26 year old female with a history of symptomatic gallstones scheduled for an elective lap chole on 02/07/2017.  She was initially evaluated by Dr. Marina GoodellPerry at [facility] on 12/05/2016 for complaints of upper abdominal pain and chest discomfort.  The patient was previously told that she had elevation in her liver enzymes, which were thought to be secondary to prescription medications, i.e., minocycline and ? Sulindac.  The medications were held and her liver enzymes almost normalized.  [place] evaluation [place]  Further work up in [place] was significant for cholelithiasis and a positive ANA, however, over time her liver enzymes completely normalized.  Over the summer she reports multiple episodes of upper abdominal pain worsened with PO intake and ultimately she was evaluated by Dr. Dwain SarnaWakefield for symptomatic cholelithiasis.  Starting this past Wednesday she started to have a recurrence of her abdominal pain with nausea.  The symptoms were persistent and not improved with a bland diet.  The pain worsened Friday night and continued to persist.  The pain was now along her right side with radiation to her back.  The patient called the answering service and with her description of the pain, generalized pruritis, but significant pruritis in her palms and soles of her feet, she was advised to come into the ER for further evaluation.  Her liver enzymes were markedly elevated as well as her TB.  Her liver panel on 12/05/2016 was normal.  Fortunately her WBC was normal and there was no report of any fever.  Past Medical History:  Diagnosis Date  . Asthma   . Elevated LFTs   . Gallstones   . IBS (irritable bowel syndrome)   . Scoliosis     Past Surgical History:  Procedure Laterality Date  . MOLE REMOVAL     x 2 on back     Family History  Problem Relation Age of Onset  . Melanoma Mother   . Hyperlipidemia Father   . Heart disease Maternal Grandmother   . Arthritis Maternal Grandmother   . Gallstones Maternal Grandfather   . Arthritis Paternal Grandmother   . Thyroid cancer Paternal Grandfather   . Colon cancer Neg Hx   . Stomach cancer Neg Hx   . Pancreatic cancer Neg Hx   . Liver disease Neg Hx     Social History:  reports that she has never smoked. She has never used smokeless tobacco. She reports that she drinks alcohol. She reports that she does not use drugs.  Allergies:  Allergies  Allergen Reactions  . Minocycline Swelling and Other (See Comments)    Joint pain and swelling, exhaustion    Medications:  Scheduled: . enoxaparin (LOVENOX) injection  40 mg Subcutaneous Q24H   Continuous: . cefTRIAXone (ROCEPHIN)  IV Stopped (02/03/17 2251)  . lactated ringers 100 mL/hr at 02/04/17 1104  . metronidazole Stopped (02/04/17 1101)    Results for orders placed or performed during the hospital encounter of 02/03/17 (from the past 24 hour(s))  Lipase, blood     Status: None   Collection Time: 02/03/17  1:40 PM  Result Value Ref Range   Lipase 40 11 - 51 U/L  Comprehensive metabolic panel     Status: Abnormal   Collection Time: 02/03/17  1:40 PM  Result Value Ref Range   Sodium 137 135 - 145 mmol/L   Potassium 3.5 3.5 -  5.1 mmol/L   Chloride 105 101 - 111 mmol/L   CO2 20 (L) 22 - 32 mmol/L   Glucose, Bld 91 65 - 99 mg/dL   BUN 6 6 - 20 mg/dL   Creatinine, Ser 4.09 0.44 - 1.00 mg/dL   Calcium 9.3 8.9 - 81.1 mg/dL   Total Protein 7.7 6.5 - 8.1 g/dL   Albumin 4.1 3.5 - 5.0 g/dL   AST 914 (H) 15 - 41 U/L   ALT 529 (H) 14 - 54 U/L   Alkaline Phosphatase 128 (H) 38 - 126 U/L   Total Bilirubin 5.5 (H) 0.3 - 1.2 mg/dL   GFR calc non Af Amer >60 >60 mL/min   GFR calc Af Amer >60 >60 mL/min   Anion gap 12 5 - 15  CBC     Status: None   Collection Time: 02/03/17  1:40 PM  Result Value  Ref Range   WBC 8.4 4.0 - 10.5 K/uL   RBC 4.46 3.87 - 5.11 MIL/uL   Hemoglobin 13.3 12.0 - 15.0 g/dL   HCT 78.2 95.6 - 21.3 %   MCV 89.0 78.0 - 100.0 fL   MCH 29.8 26.0 - 34.0 pg   MCHC 33.5 30.0 - 36.0 g/dL   RDW 08.6 57.8 - 46.9 %   Platelets 294 150 - 400 K/uL  Urinalysis, Routine w reflex microscopic     Status: Abnormal   Collection Time: 02/03/17  5:32 PM  Result Value Ref Range   Color, Urine AMBER (A) YELLOW   APPearance CLEAR CLEAR   Specific Gravity, Urine 1.020 1.005 - 1.030   pH 5.0 5.0 - 8.0   Glucose, UA NEGATIVE NEGATIVE mg/dL   Hgb urine dipstick NEGATIVE NEGATIVE   Bilirubin Urine MODERATE (A) NEGATIVE   Ketones, ur 80 (A) NEGATIVE mg/dL   Protein, ur 629 (A) NEGATIVE mg/dL   Nitrite NEGATIVE NEGATIVE   Leukocytes, UA NEGATIVE NEGATIVE   RBC / HPF 0-5 0 - 5 RBC/hpf   WBC, UA 0-5 0 - 5 WBC/hpf   Bacteria, UA RARE (A) NONE SEEN   Squamous Epithelial / LPF 0-5 (A) NONE SEEN   Mucus PRESENT   I-Stat beta hCG blood, ED ([facility], [facility], [facility] only)     Status: None   Collection Time: 02/03/17  6:04 PM  Result Value Ref Range   I-stat hCG, quantitative <5.0 <5 mIU/mL   Comment 3          Comprehensive metabolic panel     Status: Abnormal   Collection Time: 02/04/17  4:43 AM  Result Value Ref Range   Sodium 137 135 - 145 mmol/L   Potassium 3.8 3.5 - 5.1 mmol/L   Chloride 108 101 - 111 mmol/L   CO2 17 (L) 22 - 32 mmol/L   Glucose, Bld 62 (L) 65 - 99 mg/dL   BUN 7 6 - 20 mg/dL   Creatinine, Ser 5.28 0.44 - 1.00 mg/dL   Calcium 8.6 (L) 8.9 - 10.3 mg/dL   Total Protein 6.1 (L) 6.5 - 8.1 g/dL   Albumin 3.1 (L) 3.5 - 5.0 g/dL   AST 413 (H) 15 - 41 U/L   ALT 562 (H) 14 - 54 U/L   Alkaline Phosphatase 104 38 - 126 U/L   Total Bilirubin 6.2 (H) 0.3 - 1.2 mg/dL   GFR calc non Af Amer >60 >60 mL/min   GFR calc Af Amer >60 >60 mL/min   Anion gap 12 5 - 15  CBC  Status: Abnormal   Collection Time: 02/04/17  4:43 AM  Result Value Ref Range   WBC 7.5 4.0 - 10.5 K/uL   RBC  4.03 3.87 - 5.11 MIL/uL   Hemoglobin 11.6 (L) 12.0 - 15.0 g/dL   HCT 91.4 78.2 - 95.6 %   MCV 90.3 78.0 - 100.0 fL   MCH 28.8 26.0 - 34.0 pg   MCHC 31.9 30.0 - 36.0 g/dL   RDW 21.3 08.6 - 57.8 %   Platelets 255 150 - 400 K/uL     No results found.  ROS:  As stated above in the HPI otherwise negative.  Blood pressure (!) 103/57, pulse 76, temperature 98.4 F (36.9 C), temperature source Oral, resp. rate 14, height  (1.626 m), weight 59 kg (130 lb 1.6 oz), last menstrual period 01/12/2017, SpO2 99 %.    PE: Gen: NAD, Alert and Oriented HEENT:  [place]/AT, EOMI Neck: Supple, no LAD Lungs: CTA Bilaterally CV: RRR without M/G/R ABM: Soft, NTND, +BS Ext: No C/C/E  Assessment/Plan: 1) Possible choledocholithiasis. 2) Symptomatic cholelithiasis. 3) Elevated liver enzymes. 4) Jaundice. 5) RUQ abdominal pain.   Her clinical presentation is consistent with an obstructive etiology.  Currently she is undergoing an MRI, but I will tentatively place her on the schedule for an ERCP with stone extraction.   Plan: 1) Anticipate ERCP with [name] tomorrow. 2) Continue with ceftriaxone.  ADDENDUM: No evidence of choledocholithiasis.  I will cancel the ERCP.  She can undergo a lap chole with IOC per Surgery. Gurpreet Mariani D 02/04/2017, 1:05 PM

## 2017-02-04 NOTE — Progress Notes (Signed)
Pt has birth control tablets at the bedside. MD aware, it is ok for pt to take a tablet at the bedtime.  Pharmacy consulted and questioned if med needs to be sent to pharmacy for verification.  Only MD order required, not necessarily verification. MD paged.

## 2017-02-04 NOTE — Progress Notes (Signed)
   Subjective/Chief Complaint: Still with some abd pain   Objective: Vital signs in last 24 hours: Temp:  [97.7 F (36.5 C)-98.4 F (36.9 C)] 98.4 F (36.9 C) (09/09 0600) Pulse Rate:  [63-87] 76 (09/09 0600) Resp:  [13-28] 14 (09/09 0600) BP: (103-142)/(57-83) 103/57 (09/09 0600) SpO2:  [99 %-100 %] 99 % (09/09 0600) Weight:  [59 kg (130 lb 1.6 oz)] 59 kg (130 lb 1.6 oz) (09/09 0600) Last BM Date: 02/03/17  Intake/Output from previous day: 09/08 0701 - 09/09 0700 In: 433.3 [I.V.:283.3; IV Piggyback:150] Out: 500 [Urine:500] Intake/Output this shift: No intake/output data recorded.  GI: soft not tender  Lab Results:   Recent Labs  02/03/17 1340 02/04/17 0443  WBC 8.4 7.5  HGB 13.3 11.6*  HCT 39.7 36.4  PLT 294 255   BMET  Recent Labs  02/03/17 1340 02/04/17 0443  NA 137 137  K 3.5 3.8  CL 105 108  CO2 20* 17*  GLUCOSE 91 62*  BUN 6 7  CREATININE 0.75 0.81  CALCIUM 9.3 8.6*   PT/INR No results for input(s): LABPROT, INR in the last 72 hours. ABG No results for input(s): PHART, HCO3 in the last 72 hours.  Invalid input(s): PCO2, PO2  Studies/Results: No results found.  Anti-infectives: Anti-infectives    Start     Dose/Rate Route Frequency Ordered Stop   02/03/17 2200  metroNIDAZOLE (FLAGYL) IVPB 500 mg     500 mg 100 mL/hr over 60 Minutes Intravenous Every 8 hours 02/03/17 2127     02/03/17 2200  cefTRIAXone (ROCEPHIN) 2 g in dextrose 5 % 50 mL IVPB     2 g 100 mL/hr over 30 Minutes Intravenous Every 24 hours 02/03/17 2127        Assessment/Plan: Choledocholithiasis  Bili increasing, will check mrcp today, eventually needs lap chole this admission.  Would ask gi to see her also  [facility],Sierra Hernandez 02/04/2017

## 2017-02-05 ENCOUNTER — Encounter (HOSPITAL_COMMUNITY)
Admission: EM | Disposition: A | Discharge status: Home / Self Care | Payer: Self-pay | Source: Home / Self Care | Attending: Internal Medicine

## 2017-02-05 ENCOUNTER — Inpatient Hospital Stay (HOSPITAL_COMMUNITY): Payer: Managed Care, Other (non HMO) | Admitting: Certified Registered Nurse Anesthetist

## 2017-02-05 ENCOUNTER — Inpatient Hospital Stay (HOSPITAL_COMMUNITY): Payer: Managed Care, Other (non HMO)

## 2017-02-05 ENCOUNTER — Encounter (HOSPITAL_COMMUNITY): Payer: Self-pay | Admitting: Certified Registered Nurse Anesthetist

## 2017-02-05 HISTORY — PX: CHOLECYSTECTOMY: SHX55

## 2017-02-05 LAB — COMPREHENSIVE METABOLIC PANEL
ALK PHOS: 103 U/L (ref 38–126)
ALT: 532 U/L — ABNORMAL HIGH (ref 14–54)
ANION GAP: 9 (ref 5–15)
AST: 255 U/L — ABNORMAL HIGH (ref 15–41)
Albumin: 3 g/dL — ABNORMAL LOW (ref 3.5–5.0)
BUN: 5 mg/dL — ABNORMAL LOW (ref 6–20)
CALCIUM: 8.6 mg/dL — AB (ref 8.9–10.3)
CO2: 19 mmol/L — AB (ref 22–32)
Chloride: 107 mmol/L (ref 101–111)
Creatinine, Ser: 0.86 mg/dL (ref 0.44–1.00)
GFR calc Af Amer: >60 mL/min (ref >60)
GFR calc non Af Amer: >60 mL/min (ref >60)
GLUCOSE: 78 mg/dL (ref 65–99)
Potassium: 3.8 mmol/L (ref 3.5–5.1)
SODIUM: 135 mmol/L (ref 135–145)
Total Bilirubin: 5 mg/dL — ABNORMAL HIGH (ref 0.3–1.2)
Total Protein: 6.2 g/dL — ABNORMAL LOW (ref 6.5–8.1)

## 2017-02-05 SURGERY — ERCP, WITH INTERVENTION IF INDICATED
Anesthesia: General

## 2017-02-05 SURGERY — LAPAROSCOPIC CHOLECYSTECTOMY WITH INTRAOPERATIVE CHOLANGIOGRAM
Anesthesia: General | Site: Abdomen

## 2017-02-05 MED ORDER — MIDAZOLAM HCL 5 MG/5ML IJ SOLN
INTRAMUSCULAR | Status: DC | PRN
  Administered 2017-02-05: 2 mg via INTRAVENOUS

## 2017-02-05 MED ORDER — DEXAMETHASONE SODIUM PHOSPHATE 10 MG/ML IJ SOLN
INTRAMUSCULAR | Status: DC | PRN
  Administered 2017-02-05: 5 mg via INTRAVENOUS

## 2017-02-05 MED ORDER — SODIUM CHLORIDE 0.9 % IV SOLN
INTRAVENOUS | Status: DC | PRN
  Administered 2017-02-05: 25 mL

## 2017-02-05 MED ORDER — 0.9 % SODIUM CHLORIDE (POUR BTL) OPTIME
TOPICAL | Status: DC | PRN
  Administered 2017-02-05: 1000 mL

## 2017-02-05 MED ORDER — MEPERIDINE HCL 25 MG/ML IJ SOLN: 6.2500 mg | INTRAMUSCULAR | Status: DC | PRN

## 2017-02-05 MED ORDER — OXYCODONE-ACETAMINOPHEN 5-325 MG PO TABS
1.0000 | ORAL_TABLET | ORAL | Status: DC | PRN
  Administered 2017-02-05 – 2017-02-06 (×6): 1 via ORAL
  Filled 2017-02-05 (×6): qty 1

## 2017-02-05 MED ORDER — PROPOFOL 10 MG/ML IV BOLUS
INTRAVENOUS | Status: AC
  Filled 2017-02-05: qty 20

## 2017-02-05 MED ORDER — IOPAMIDOL (ISOVUE-300) INJECTION 61%
INTRAVENOUS | Status: AC
  Filled 2017-02-05: qty 50

## 2017-02-05 MED ORDER — CHLORHEXIDINE GLUCONATE CLOTH 2 % EX PADS
6.0000 | MEDICATED_PAD | Freq: Once | CUTANEOUS | Status: AC
  Administered 2017-02-05: 6 via TOPICAL

## 2017-02-05 MED ORDER — LACTATED RINGERS IV SOLN
INTRAVENOUS | Status: DC
  Administered 2017-02-05: 09:00:00 via INTRAVENOUS

## 2017-02-05 MED ORDER — NEOSTIGMINE METHYLSULFATE 10 MG/10ML IV SOLN
INTRAVENOUS | Status: DC | PRN
  Administered 2017-02-05: 3 mg via INTRAVENOUS

## 2017-02-05 MED ORDER — LIDOCAINE HCL (CARDIAC) 20 MG/ML IV SOLN
INTRAVENOUS | Status: DC | PRN
  Administered 2017-02-05: 60 mg via INTRAVENOUS

## 2017-02-05 MED ORDER — MIDAZOLAM HCL 2 MG/2ML IJ SOLN
INTRAMUSCULAR | Status: AC
  Filled 2017-02-05: qty 2

## 2017-02-05 MED ORDER — BUPIVACAINE-EPINEPHRINE (PF) 0.25% -1:200000 IJ SOLN
INTRAMUSCULAR | Status: AC
  Filled 2017-02-05: qty 30

## 2017-02-05 MED ORDER — SODIUM CHLORIDE 0.9 % IR SOLN
Status: DC | PRN
  Administered 2017-02-05: 1000 mL

## 2017-02-05 MED ORDER — ROCURONIUM BROMIDE 100 MG/10ML IV SOLN
INTRAVENOUS | Status: DC | PRN
  Administered 2017-02-05: 50 mg via INTRAVENOUS

## 2017-02-05 MED ORDER — BUPIVACAINE-EPINEPHRINE 0.25% -1:200000 IJ SOLN
INTRAMUSCULAR | Status: DC | PRN
  Administered 2017-02-05: 10 mL

## 2017-02-05 MED ORDER — FENTANYL CITRATE (PF) 250 MCG/5ML IJ SOLN
INTRAMUSCULAR | Status: AC
  Filled 2017-02-05: qty 5

## 2017-02-05 MED ORDER — ONDANSETRON HCL 4 MG/2ML IJ SOLN: 4.0000 mg | Freq: Once | INTRAMUSCULAR | Status: DC | PRN

## 2017-02-05 MED ORDER — ARTIFICIAL TEARS OPHTHALMIC OINT
TOPICAL_OINTMENT | OPHTHALMIC | Status: DC | PRN
  Administered 2017-02-05: 1 via OPHTHALMIC

## 2017-02-05 MED ORDER — PROPOFOL 10 MG/ML IV BOLUS
INTRAVENOUS | Status: DC | PRN
Start: 2017-02-05 — End: 2017-02-05
  Administered 2017-02-05: 130 mg via INTRAVENOUS

## 2017-02-05 MED ORDER — BUPIVACAINE-EPINEPHRINE (PF) 0.5% -1:200000 IJ SOLN
INTRAMUSCULAR | Status: AC
  Filled 2017-02-05: qty 30

## 2017-02-05 MED ORDER — CEFAZOLIN SODIUM-DEXTROSE 2-4 GM/100ML-% IV SOLN
2.0000 g | INTRAVENOUS | Status: AC
  Administered 2017-02-05: 2 g via INTRAVENOUS
  Filled 2017-02-05: qty 100

## 2017-02-05 MED ORDER — FENTANYL CITRATE (PF) 100 MCG/2ML IJ SOLN
INTRAMUSCULAR | Status: AC
  Administered 2017-02-05: 50 ug via INTRAVENOUS
  Filled 2017-02-05: qty 2

## 2017-02-05 MED ORDER — FENTANYL CITRATE (PF) 100 MCG/2ML IJ SOLN
INTRAMUSCULAR | Status: DC | PRN
  Administered 2017-02-05 (×3): 50 ug via INTRAVENOUS
  Administered 2017-02-05: 100 ug via INTRAVENOUS
  Administered 2017-02-05 (×3): 50 ug via INTRAVENOUS

## 2017-02-05 MED ORDER — FENTANYL CITRATE (PF) 100 MCG/2ML IJ SOLN
25.0000 ug | INTRAMUSCULAR | Status: DC | PRN
  Administered 2017-02-05 (×2): 50 ug via INTRAVENOUS

## 2017-02-05 MED ORDER — GLYCOPYRROLATE 0.2 MG/ML IJ SOLN
INTRAMUSCULAR | Status: DC | PRN
  Administered 2017-02-05: .4 mg via INTRAVENOUS

## 2017-02-05 MED ORDER — CHLORHEXIDINE GLUCONATE CLOTH 2 % EX PADS: 6.0000 | MEDICATED_PAD | Freq: Once | CUTANEOUS | Status: DC

## 2017-02-05 SURGICAL SUPPLY — 39 items
APPLIER CLIP ROT 10 11.4 M/L (STAPLE) ×3
CANISTER SUCT 3000ML PPV (MISCELLANEOUS) ×3 IMPLANT
CHLORAPREP W/TINT 26ML (MISCELLANEOUS) ×3 IMPLANT
CLIP APPLIE ROT 10 11.4 M/L (STAPLE) ×1 IMPLANT
COVER MAYO STAND STRL (DRAPES) ×3 IMPLANT
COVER SURGICAL LIGHT HANDLE (MISCELLANEOUS) ×3 IMPLANT
DERMABOND ADVANCED (GAUZE/BANDAGES/DRESSINGS) ×2
DERMABOND ADVANCED .7 DNX12 (GAUZE/BANDAGES/DRESSINGS) ×1 IMPLANT
DRAPE C-ARM 42X72 X-RAY (DRAPES) ×3 IMPLANT
DRAPE WARM FLUID 44X44 (DRAPE) ×3 IMPLANT
ELECT REM PT RETURN 9FT ADLT (ELECTROSURGICAL) ×3
ELECTRODE REM PT RTRN 9FT ADLT (ELECTROSURGICAL) ×1 IMPLANT
GLOVE BIO SURGEON STRL SZ7.5 (GLOVE) ×3 IMPLANT
GLOVE BIO SURGEON STRL SZ8 (GLOVE) ×3 IMPLANT
GLOVE BIOGEL PI IND STRL 8 (GLOVE) ×2 IMPLANT
GLOVE BIOGEL PI IND STRL 8.5 (GLOVE) ×1 IMPLANT
GLOVE BIOGEL PI INDICATOR 8 (GLOVE) ×4
GLOVE BIOGEL PI INDICATOR 8.5 (GLOVE) ×2
GOWN STRL REUS W/ TWL LRG LVL3 (GOWN DISPOSABLE) ×2 IMPLANT
GOWN STRL REUS W/ TWL XL LVL3 (GOWN DISPOSABLE) ×2 IMPLANT
GOWN STRL REUS W/TWL LRG LVL3 (GOWN DISPOSABLE) ×4
GOWN STRL REUS W/TWL XL LVL3 (GOWN DISPOSABLE) ×4
KIT BASIN OR (CUSTOM PROCEDURE TRAY) ×3 IMPLANT
KIT ROOM TURNOVER OR (KITS) ×3 IMPLANT
NS IRRIG 1000ML POUR BTL (IV SOLUTION) ×3 IMPLANT
PAD ARMBOARD 7.5X6 YLW CONV (MISCELLANEOUS) ×6 IMPLANT
POUCH SPECIMEN RETRIEVAL 10MM (ENDOMECHANICALS) ×3 IMPLANT
SCISSORS LAP 5X35 DISP (ENDOMECHANICALS) ×3 IMPLANT
SET CHOLANGIOGRAPH 5 50 .035 (SET/KITS/TRAYS/PACK) ×3 IMPLANT
SET IRRIG TUBING LAPAROSCOPIC (IRRIGATION / IRRIGATOR) ×3 IMPLANT
SLEEVE ENDOPATH XCEL 5M (ENDOMECHANICALS) ×3 IMPLANT
SPECIMEN JAR SMALL (MISCELLANEOUS) ×3 IMPLANT
SUT MNCRL AB 4-0 PS2 18 (SUTURE) ×6 IMPLANT
TOWEL OR 17X24 6PK STRL BLUE (TOWEL DISPOSABLE) ×3 IMPLANT
TRAY LAPAROSCOPIC MC (CUSTOM PROCEDURE TRAY) ×3 IMPLANT
TROCAR XCEL BLUNT TIP 100MML (ENDOMECHANICALS) ×3 IMPLANT
TROCAR XCEL NON-BLD 11X100MML (ENDOMECHANICALS) ×3 IMPLANT
TROCAR XCEL NON-BLD 5MMX100MML (ENDOMECHANICALS) ×3 IMPLANT
TUBING INSUFFLATION (TUBING) ×3 IMPLANT

## 2017-02-05 NOTE — Progress Notes (Signed)
   Subjective:  Patient was feeling better when seen this morning. She was going for laparoscopic cholecystectomy today.  Objective:  Vital signs in last 24 hours: Vitals:   02/05/17 1205 02/05/17 1220 02/05/17 1230 02/05/17 1410  BP: 122/76 120/74 125/77 124/68  Pulse: 64 62 62 75  Resp: 20 18 17 17   Temp:   98.1 F (36.7 C) 98.4 F (36.9 C)  TempSrc:    Oral  SpO2: 98% 97% 98% 100%  Weight:      Height:       Gen.well-developed, well-nourished, young lady, in no acute distress. Chest. Clear bilaterally. CV. Regular rate and rhythm. Abdomen.soft, mild right upper quadrant tenderness, bowel sounds positive. Extremities.no edema, no cyanosis, pulses intact and symmetrical.   Assessment/Plan:  Choledocholithiasis. She had her laparoscopic cholecystectomy today. Found to have mild cholecystitis, IOC shows nonocclusive filling defect in distal CBD. She will need an ERCP with removal of CBD stones, if present. GI was consulted. Will go for ERCP tomorrow morning. -Continue Rocephin and Flagyl. -Nothing by mouth after midnight.  Transaminitis. Most likely because of obstruction due to cholelithiasis.There was mild improvement on her liver function today. -Repeat CMP tomorrow morning.  Dispo: Anticipated discharge in approximately 1-2 day(s).   Arnetha CourserAmin, Julena Barbour, MD 02/05/2017, 6:15 PM Pager: [phone]

## 2017-02-05 NOTE — Transfer of Care (Signed)
Immediate Anesthesia Transfer of Care Note  Patient: Sierra Hernandez  Procedure(s) Performed: Procedure(s): LAPAROSCOPIC CHOLECYSTECTOMY WITH INTRAOPERATIVE CHOLANGIOGRAM (N/A)  Patient Location: PACU  Anesthesia Type:General  Level of Consciousness: awake, alert , oriented and patient cooperative  Airway & Oxygen Therapy: Patient Spontanous Breathing  Post-op Assessment: Report given to RN and Post -op Vital signs reviewed and stable  Post vital signs: Reviewed and stable  Last Vitals:  Vitals:   02/05/17 0534 02/05/17 1105  BP: (!) 109/59 135/85  Pulse: 64 93  Resp: 14 20  Temp: 37.1 C 36.8 C  SpO2: 98% 100%    Last Pain:  Vitals:   02/05/17 0534  TempSrc: Oral  PainSc:          Complications: No apparent anesthesia complications

## 2017-02-05 NOTE — Progress Notes (Signed)
Day of Surgery   Subjective/Chief Complaint: abdominal pain Less abdominal pain   Objective: Vital signs in last 24 hours: Temp:  [98.1 F (36.7 C)-99.2 F (37.3 C)] 98.7 F (37.1 C) (09/10 0534) Pulse Rate:  [64-73] 64 (09/10 0534) Resp:  [14] 14 (09/10 0534) BP: (109-117)/(59-69) 109/59 (09/10 0534) SpO2:  [98 %-100 %] 98 % (09/10 0534) Last BM Date: 02/03/17  Intake/Output from previous day: 09/09 0701 - 09/10 0700 In: -  Out: 1850 [Urine:1850] Intake/Output this shift: No intake/output data recorded.  GI: soft, non-tender; bowel sounds normal; no masses,  no organomegaly  Lab Results:   Recent Labs  02/03/17 1340 02/04/17 0443  WBC 8.4 7.5  HGB 13.3 11.6*  HCT 39.7 36.4  PLT 294 255   BMET  Recent Labs  02/04/17 0443 02/05/17 0247  NA 137 135  K 3.8 3.8  CL 108 107  CO2 17* 19*  GLUCOSE 62* 78  BUN 7 5*  CREATININE 0.81 0.86  CALCIUM 8.6* 8.6*   PT/INR No results for input(s): LABPROT, INR in the last 72 hours. ABG No results for input(s): PHART, HCO3 in the last 72 hours.  Invalid input(s): PCO2, PO2  Studies/Results: Mr 3d Recon At Scanner  Result Date: 02/04/2017 CLINICAL DATA:  26 year old female with transiently elevated liver function test. Gradually worsening right upper quadrant abdominal pain with some associated nausea. Loss of appetite. EXAM: MRI ABDOMEN WITHOUT AND WITH CONTRAST (INCLUDING MRCP) TECHNIQUE: Multiplanar multisequence MR imaging of the abdomen was performed both before and after the administration of intravenous contrast. Heavily T2-weighted images of the biliary and pancreatic ducts were obtained, and three-dimensional MRCP images were rendered by post processing. CONTRAST:  14mL MULTIHANCE GADOBENATE DIMEGLUMINE 529 MG/ML IV SOLN COMPARISON:  No priors. FINDINGS: Lower chest: Unremarkable. Hepatobiliary: No discrete cystic or solid hepatic lesions. No intra or extrahepatic biliary ductal dilatation. Common bile duct  measures up to 3 mm in the porta hepatis. MRCP images are sub optimal secondary to respiratory motion. Gallbladder appears nearly completely decompressed around a filling defect that measures 2.3 cm in greatest length, presumably a large gallstone. No pericholecystic fluid or surrounding inflammatory changes. Pancreas: No pancreatic mass. No pancreatic ductal dilatation. No pancreatic or peripancreatic fluid or inflammatory changes. Spleen:  Unremarkable. Adrenals/Urinary Tract: Bilateral kidneys and bilateral adrenal glands are normal in appearance. No hydroureteronephrosis in the visualized portions of the abdomen. Stomach/Bowel: Visualized portions are unremarkable. Vascular/Lymphatic: No aneurysm identified in the visualized abdominal vasculature. No lymphadenopathy noted in the visualized portions of the abdomen. Other: No significant volume of ascites noted in the visualized portions of the peritoneal cavity. Musculoskeletal: No aggressive appearing osseous lesions are noted in the visualized portions of the skeleton. IMPRESSION: 1. Cholelithiasis without evidence to suggest an acute cholecystitis at this time. 2. No other acute findings are noted in the abdomen to account for the patient's symptoms. Electronically Signed   By: Trudie Reed M.D.   On: 02/04/2017 16:46   Mr Abdomen Mrcp Vivien Rossetti Contast  Result Date: 02/04/2017 CLINICAL DATA:  26 year old female with transiently elevated liver function test. Gradually worsening right upper quadrant abdominal pain with some associated nausea. Loss of appetite. EXAM: MRI ABDOMEN WITHOUT AND WITH CONTRAST (INCLUDING MRCP) TECHNIQUE: Multiplanar multisequence MR imaging of the abdomen was performed both before and after the administration of intravenous contrast. Heavily T2-weighted images of the biliary and pancreatic ducts were obtained, and three-dimensional MRCP images were rendered by post processing. CONTRAST:  14mL MULTIHANCE GADOBENATE DIMEGLUMINE 529  MG/ML IV SOLN COMPARISON:  No priors. FINDINGS: Lower chest: Unremarkable. Hepatobiliary: No discrete cystic or solid hepatic lesions. No intra or extrahepatic biliary ductal dilatation. Common bile duct measures up to 3 mm in the porta hepatis. MRCP images are sub optimal secondary to respiratory motion. Gallbladder appears nearly completely decompressed around a filling defect that measures 2.3 cm in greatest length, presumably a large gallstone. No pericholecystic fluid or surrounding inflammatory changes. Pancreas: No pancreatic mass. No pancreatic ductal dilatation. No pancreatic or peripancreatic fluid or inflammatory changes. Spleen:  Unremarkable. Adrenals/Urinary Tract: Bilateral kidneys and bilateral adrenal glands are normal in appearance. No hydroureteronephrosis in the visualized portions of the abdomen. Stomach/Bowel: Visualized portions are unremarkable. Vascular/Lymphatic: No aneurysm identified in the visualized abdominal vasculature. No lymphadenopathy noted in the visualized portions of the abdomen. Other: No significant volume of ascites noted in the visualized portions of the peritoneal cavity. Musculoskeletal: No aggressive appearing osseous lesions are noted in the visualized portions of the skeleton. IMPRESSION: 1. Cholelithiasis without evidence to suggest an acute cholecystitis at this time. 2. No other acute findings are noted in the abdomen to account for the patient's symptoms. Electronically Signed   By: Trudie Reedaniel  Entrikin M.D.   On: 02/04/2017 16:46    Anti-infectives: Anti-infectives    Start     Dose/Rate Route Frequency Ordered Stop   02/05/17 0830  ceFAZolin (ANCEF) IVPB 2g/100 mL premix     2 g 200 mL/hr over 30 Minutes Intravenous On call to O.R. 02/05/17 0829 02/06/17 0559   02/03/17 2200  metroNIDAZOLE (FLAGYL) IVPB 500 mg     500 mg 100 mL/hr over 60 Minutes Intravenous Every 8 hours 02/03/17 2127     02/03/17 2200  cefTRIAXone (ROCEPHIN) 2 g in dextrose 5 % 50 mL  IVPB     2 g 100 mL/hr over 30 Minutes Intravenous Every 24 hours 02/03/17 2127        Assessment/Plan:     Patient Active Problem List   Diagnosis Date Noted  . Acute abdominal pain in right upper quadrant   . Cholelithiasis with choledocholithiasis   . Elevated LFTs   . Hyperbilirubinemia   . Cholecystitis 02/03/2017  . Choledocholithiasis 02/03/2017   OR today for LGB/IOC since MRCP negative for CBD stones  Discussed with the patient the procedure and the findings  The procedure has been discussed with the patient. Operative and non operative treatments have been discussed. Risks of surgery include bleeding, infection,  Common bile duct injury,  Injury to the stomach,liver, colon,small intestine, abdominal wall,  Diaphragm,  Major blood vessels,  And the need for an open procedure.  Other risks include worsening of medical problems, death,  DVT and pulmonary embolism, and cardiovascular events.   Medical options have also been discussed. The patient has been informed of long term expectations of surgery and non surgical options,  The patient agrees to proceed.    LOS: 2 days    Lanisha Stepanian A. 02/05/2017

## 2017-02-05 NOTE — Progress Notes (Signed)
    Patient went for cholecystectomy today. IOC shows nonocclusive filling defect in distal CBD. She will need an ERCP with removal of CBD stones, if present. Mother in room, patient having nausea / vomiting. Discussed need for ERCP. Per Mother, ERCP and its associated risks / benefits were already explained by Dr. Benson Norway covering for [place] over the weekend. Patient nor Mother have any additional questions.   ERCP is scheduled for tomorrow at 1:00pm with [facility]. I stopped her Lovenox. She is already getting antibiotics (rocephin and flagyl).

## 2017-02-05 NOTE — Op Note (Signed)
Laparoscopic Cholecystectomy with IOC Procedure Note  Indications: This patient presents with symptomatic gallbladder disease and will undergo laparoscopic cholecystectomy. The procedure has been discussed with the patient. Operative and non operative treatments have been discussed. Risks of surgery include bleeding, infection,  Common bile duct injury,  Injury to the stomach,liver, colon,small intestine, abdominal wall,  Diaphragm,  Major blood vessels,  And the need for an open procedure.  Other risks include worsening of medical problems, death,  DVT and pulmonary embolism, and cardiovascular events.   Medical options have also been discussed. The patient has been informed of long term expectations of surgery and non surgical options,  The patient agrees to proceed.    Pre-operative Diagnosis: Calculus of bile duct with acute cholecystitis without mention of obstruction  Post-operative Diagnosis: Same  Surgeon: Aizen Duval A.   Assistants: Dr Cliffton Asters MD   Anesthesia: General endotracheal anesthesia and Local anesthesia 0.25.% bupivacaine, with epinephrine  ASA Class: 2  Procedure Details  The patient was seen again in the Holding Room. The risks, benefits, complications, treatment options, and expected outcomes were discussed with the patient. The possibilities of reaction to medication, pulmonary aspiration, perforation of viscus, bleeding, recurrent infection, finding a normal gallbladder, the need for additional procedures, failure to diagnose a condition, the possible need to convert to an open procedure, and creating a complication requiring transfusion or operation were discussed with the patient. The patient and/or family concurred with the proposed plan, giving informed consent. The site of surgery properly noted/marked. The patient was taken to Operating Room, identified as Sierra Hernandez and the procedure verified as Laparoscopic Cholecystectomy with Intraoperative Cholangiograms. A  Time Out was held and the above information confirmed.  Prior to the induction of general anesthesia, antibiotic prophylaxis was administered. General endotracheal anesthesia was then administered and tolerated well. After the induction, the abdomen was prepped in the usual sterile fashion. The patient was positioned in the supine position with the left arm comfortably tucked, along with some reverse Trendelenburg.  Local anesthetic agent was injected into the skin near the umbilicus and an incision made. The midline fascia was incised and the Hasson technique was used to introduce a 12 mm port under direct vision. It was secured with a figure of eight Vicryl suture placed in the usual fashion. Pneumoperitoneum was then created with CO2 and tolerated well without any adverse changes in the patient's vital signs. Additional trocars were introduced under direct vision with an 11 mm trocar in the epigastrium and 2 5 mm trocars in the right upper quadrant. All skin incisions were infiltrated with a local anesthetic agent before making the incision and placing the trocars.   The gallbladder was identified, the fundus grasped and retracted cephalad. Adhesions were lysed bluntly and with the electrocautery where indicated, taking care not to injure any adjacent organs or viscus. The infundibulum was grasped and retracted laterally, exposing the peritoneum overlying the triangle of Calot. This was then divided and exposed in a blunt fashion. The cystic duct was clearly identified and bluntly dissected circumferentially. The junctions of the gallbladder, cystic duct and common bile duct were clearly identified prior to the division of any linear structure.   An incision was made in the cystic duct and the cholangiogram catheter introduced. The catheter was secured using an endoclip. The study showed multiple  stones and good visualization of the distal and proximal biliary tree.  There was no obstruction. The  catheter was then removed.   The cystic duct was then  ligated with surgical clips  on the patient side and  clipped on the gallbladder side and divided. The cystic artery was identified, dissected free, ligated with clips and divided as well. Posterior cystic artery clipped and divided.  The gallbladder was dissected from the liver bed in retrograde fashion with the electrocautery. The gallbladder was removed. The liver bed was irrigated and inspected. Hemostasis was achieved with the electrocautery. Copious irrigation was utilized and was repeatedly aspirated until clear all particulate matter. Hemostasis was achieved with no signs of bleeding or bile leakage.  Pneumoperitoneum was completely reduced after viewing removal of the trocars under direct vision. The wound was thoroughly irrigated and the fascia was then closed with a figure of eight suture; the skin was then closed with 4 O  monocryl and a sterile dressing of  Dermabond  was applied.  Instrument, sponge, and needle counts were correct at closure and at the conclusion of the case.   Findings: Cholecystitis with Cholelithiasis and choledocolithiasis  Estimated Blood Loss: less than 50 mL         Drains: none          Total IV Fluids: per anesthesia          Specimens: Gallbladder           Complications: None; patient tolerated the procedure well.         Disposition: PACU - hemodynamically stable.  Will ask GI to see for possible ERCP          Condition: stable

## 2017-02-05 NOTE — Anesthesia Procedure Notes (Signed)
Procedure Name: Intubation Date/Time: 02/05/2017 9:49 AM Performed by: Salli Quarry Kizzie Cotten Pre-anesthesia Checklist: Patient identified, Emergency Drugs available, Suction available and Patient being monitored Patient Re-evaluated:Patient Re-evaluated prior to induction Oxygen Delivery Method: Circle System Utilized Preoxygenation: Pre-oxygenation with 100% oxygen Induction Type: IV induction Ventilation: Mask ventilation without difficulty Laryngoscope Size: Mac and 3 Grade View: Grade I Tube type: Oral Tube size: 7.0 mm Number of attempts: 1 Airway Equipment and Method: Stylet Placement Confirmation: ETT inserted through vocal cords under direct vision,  positive ETCO2 and breath sounds checked- equal and bilateral Secured at: 22 cm Tube secured with: Tape Dental Injury: Teeth and Oropharynx as per pre-operative assessment

## 2017-02-05 NOTE — Anesthesia Postprocedure Evaluation (Signed)
Anesthesia Post Note  Patient: Ripley Fraisemanda Flood  Procedure(s) Performed: Procedure(s) (LRB): LAPAROSCOPIC CHOLECYSTECTOMY WITH INTRAOPERATIVE CHOLANGIOGRAM (N/A)     Patient location during evaluation: PACU Anesthesia Type: General Level of consciousness: awake and alert Pain management: pain level controlled Vital Signs Assessment: post-procedure vital signs reviewed and stable Respiratory status: spontaneous breathing, nonlabored ventilation, respiratory function stable and patient connected to nasal cannula oxygen Cardiovascular status: blood pressure returned to baseline and stable Postop Assessment: no signs of nausea or vomiting Anesthetic complications: no    Last Vitals:  Vitals:   02/05/17 1105 02/05/17 1120  BP: 135/85 123/72  Pulse: 93 61  Resp: 20 20  Temp: 36.8 C   SpO2: 100% 98%    Last Pain:  Vitals:   02/05/17 1120  TempSrc:   PainSc: Asleep                 Yerik Zeringue

## 2017-02-05 NOTE — Anesthesia Preprocedure Evaluation (Signed)
Anesthesia Evaluation  Patient identified by MRN, date of birth, ID band Patient awake    Reviewed: Allergy & Precautions, NPO status , Patient's Chart, lab work & pertinent test results  Airway Mallampati: II  TM Distance: >3 FB Neck ROM: Full    Dental no notable dental hx.    Pulmonary neg pulmonary ROS,    Pulmonary exam normal breath sounds clear to auscultation       Cardiovascular negative cardio ROS Normal cardiovascular exam Rhythm:Regular Rate:Normal     Neuro/Psych negative neurological ROS  negative psych ROS   GI/Hepatic negative GI ROS, Neg liver ROS,   Endo/Other  negative endocrine ROS  Renal/GU negative Renal ROS  negative genitourinary   Musculoskeletal negative musculoskeletal ROS (+)   Abdominal   Peds negative pediatric ROS (+)  Hematology negative hematology ROS (+)   Anesthesia Other Findings   Reproductive/Obstetrics negative OB ROS                             Anesthesia Physical Anesthesia Plan  ASA: I  Anesthesia Plan: General   Post-op Pain Management:    Induction: Intravenous  PONV Risk Score and Plan: 3 and Ondansetron, Dexamethasone, Midazolam, Treatment may vary due to age or medical condition and Scopolamine patch - Pre-op  Airway Management Planned: Oral ETT  Additional Equipment:   Intra-op Plan:   Post-operative Plan: Extubation in OR  Informed Consent:   Plan Discussed with:   Anesthesia Plan Comments: (  )        Anesthesia Quick Evaluation

## 2017-02-06 ENCOUNTER — Encounter (HOSPITAL_COMMUNITY): Payer: Self-pay | Admitting: Surgery

## 2017-02-06 ENCOUNTER — Encounter (HOSPITAL_COMMUNITY)
Admission: EM | Disposition: A | Discharge status: Home / Self Care | Payer: Self-pay | Source: Home / Self Care | Attending: Internal Medicine

## 2017-02-06 ENCOUNTER — Inpatient Hospital Stay (HOSPITAL_COMMUNITY): Payer: Managed Care, Other (non HMO) | Admitting: Certified Registered"

## 2017-02-06 ENCOUNTER — Inpatient Hospital Stay (HOSPITAL_COMMUNITY): Payer: Managed Care, Other (non HMO)

## 2017-02-06 DIAGNOSIS — Z9049 Acquired absence of other specified parts of digestive tract: Secondary | ICD-10-CM

## 2017-02-06 DIAGNOSIS — K8040 Calculus of bile duct with cholecystitis, unspecified, without obstruction: Secondary | ICD-10-CM

## 2017-02-06 DIAGNOSIS — R740 Nonspecific elevation of levels of transaminase and lactic acid dehydrogenase [LDH]: Secondary | ICD-10-CM

## 2017-02-06 DIAGNOSIS — K8050 Calculus of bile duct without cholangitis or cholecystitis without obstruction: Secondary | ICD-10-CM

## 2017-02-06 HISTORY — PX: ERCP: SHX5425

## 2017-02-06 LAB — COMPREHENSIVE METABOLIC PANEL
ALK PHOS: 94 U/L (ref 38–126)
ALT: 529 U/L — ABNORMAL HIGH (ref 14–54)
ANION GAP: 10 (ref 5–15)
AST: 220 U/L — ABNORMAL HIGH (ref 15–41)
Albumin: 2.9 g/dL — ABNORMAL LOW (ref 3.5–5.0)
BUN: 7 mg/dL (ref 6–20)
CALCIUM: 8.4 mg/dL — AB (ref 8.9–10.3)
CO2: 24 mmol/L (ref 22–32)
Chloride: 101 mmol/L (ref 101–111)
Creatinine, Ser: 0.76 mg/dL (ref 0.44–1.00)
GFR calc non Af Amer: >60 mL/min (ref >60)
Glucose, Bld: 100 mg/dL — ABNORMAL HIGH (ref 65–99)
POTASSIUM: 3.5 mmol/L (ref 3.5–5.1)
SODIUM: 135 mmol/L (ref 135–145)
TOTAL PROTEIN: 5.7 g/dL — AB (ref 6.5–8.1)
Total Bilirubin: 3.5 mg/dL — ABNORMAL HIGH (ref 0.3–1.2)

## 2017-02-06 LAB — CBC
HCT: 34 % — ABNORMAL LOW (ref 36.0–46.0)
HEMOGLOBIN: 11.2 g/dL — AB (ref 12.0–15.0)
MCH: 29.3 pg (ref 26.0–34.0)
MCHC: 32.9 g/dL (ref 30.0–36.0)
MCV: 89 fL (ref 78.0–100.0)
Platelets: 267 10*3/uL (ref 150–400)
RBC: 3.82 MIL/uL — AB (ref 3.87–5.11)
RDW: 12.6 % (ref 11.5–15.5)
WBC: 14 10*3/uL — ABNORMAL HIGH (ref 4.0–10.5)

## 2017-02-06 SURGERY — ERCP, WITH INTERVENTION IF INDICATED
Anesthesia: General

## 2017-02-06 MED ORDER — CIPROFLOXACIN IN D5W 400 MG/200ML IV SOLN
INTRAVENOUS | Status: AC
  Filled 2017-02-06: qty 200

## 2017-02-06 MED ORDER — GLUCAGON HCL RDNA (DIAGNOSTIC) 1 MG IJ SOLR
INTRAMUSCULAR | Status: AC
  Filled 2017-02-06: qty 1

## 2017-02-06 MED ORDER — SODIUM CHLORIDE 0.9 % IV SOLN
INTRAVENOUS | Status: DC | PRN
  Administered 2017-02-06: 1348 mL

## 2017-02-06 MED ORDER — ONDANSETRON HCL 4 MG/2ML IJ SOLN
INTRAMUSCULAR | Status: DC | PRN
  Administered 2017-02-05: 4 mg via INTRAVENOUS

## 2017-02-06 MED ORDER — DEXAMETHASONE SODIUM PHOSPHATE 10 MG/ML IJ SOLN
INTRAMUSCULAR | Status: DC | PRN
  Administered 2017-02-06: 10 mg via INTRAVENOUS

## 2017-02-06 MED ORDER — SCOPOLAMINE 1 MG/3DAYS TD PT72
1.0000 | MEDICATED_PATCH | TRANSDERMAL | Status: DC
  Administered 2017-02-06: 1 via TRANSDERMAL
  Filled 2017-02-06: qty 1

## 2017-02-06 MED ORDER — PROPOFOL 500 MG/50ML IV EMUL
INTRAVENOUS | Status: DC | PRN
  Administered 2017-02-06: 25 ug/kg/min via INTRAVENOUS

## 2017-02-06 MED ORDER — FENTANYL CITRATE (PF) 100 MCG/2ML IJ SOLN
INTRAMUSCULAR | Status: DC | PRN
  Administered 2017-02-06 (×2): 50 ug via INTRAVENOUS

## 2017-02-06 MED ORDER — IOPAMIDOL (ISOVUE-300) INJECTION 61%
INTRAVENOUS | Status: AC
  Filled 2017-02-06: qty 50

## 2017-02-06 MED ORDER — INDOMETHACIN 50 MG RE SUPP
RECTAL | Status: AC
  Filled 2017-02-06: qty 1

## 2017-02-06 MED ORDER — SODIUM CHLORIDE 0.9 % IV SOLN: INTRAVENOUS | Status: DC

## 2017-02-06 MED ORDER — INDOMETHACIN 50 MG RE SUPP
RECTAL | Status: DC | PRN
  Administered 2017-02-06: 100 mg via RECTAL

## 2017-02-06 MED ORDER — MIDAZOLAM HCL 5 MG/5ML IJ SOLN
INTRAMUSCULAR | Status: DC | PRN
  Administered 2017-02-06: 2 mg via INTRAVENOUS

## 2017-02-06 MED ORDER — ONDANSETRON HCL 4 MG/2ML IJ SOLN
INTRAMUSCULAR | Status: DC | PRN
  Administered 2017-02-06: 4 mg via INTRAVENOUS

## 2017-02-06 MED ORDER — LIDOCAINE HCL (CARDIAC) 20 MG/ML IV SOLN
INTRAVENOUS | Status: DC | PRN
  Administered 2017-02-06: 40 mg via INTRAVENOUS

## 2017-02-06 MED ORDER — IBUPROFEN 400 MG PO TABS
400.0000 mg | ORAL_TABLET | Freq: Four times a day (QID) | ORAL | Status: DC
  Filled 2017-02-06: qty 1

## 2017-02-06 MED ORDER — CIPROFLOXACIN IN D5W 400 MG/200ML IV SOLN
INTRAVENOUS | Status: DC | PRN
  Administered 2017-02-06: 400 mg via INTRAVENOUS

## 2017-02-06 MED ORDER — SUCCINYLCHOLINE CHLORIDE 20 MG/ML IJ SOLN
INTRAMUSCULAR | Status: DC | PRN
  Administered 2017-02-06: 100 mg via INTRAVENOUS

## 2017-02-06 MED ORDER — INDOMETHACIN 50 MG RE SUPP: 100.0000 mg | Freq: Once | RECTAL | Status: DC

## 2017-02-06 MED ORDER — PROPOFOL 10 MG/ML IV BOLUS
INTRAVENOUS | Status: DC | PRN
  Administered 2017-02-06: 120 mg via INTRAVENOUS

## 2017-02-06 NOTE — Progress Notes (Signed)
Patient ID: Sierra Hernandez, female   DOB: [birth year], 26 y.o.   MRN: [id number]  [facility] Surgery Progress Note  1 Day Post-Op  Subjective: CC- s/p lap chole Patient states that she is very sore this morning. Vomited last night after surgery, but has tolerated clear liquids since then.  ERCP later today.  Objective: Vital signs in last 24 hours: Temp:  [98.1 F (36.7 C)-98.4 F (36.9 C)] 98.4 F (36.9 C) (09/11 0627) Pulse Rate:  [58-93] 68 (09/11 0627) Resp:  [16-20] 17 (09/11 0627) BP: (107-135)/(64-85) 107/64 (09/11 0627) SpO2:  [97 %-100 %] 98 % (09/11 0627) Last BM Date: 02/03/17  Intake/Output from previous day: 09/10 0701 - 09/11 0700 In: 2222 [P.O.:240; I.V.:1182; IV Piggyback:800] Out: 530 [Urine:500; Blood:30] Intake/Output this shift: No intake/output data recorded.  PE: Gen:  Alert, NAD, pleasant HEENT: EOM's intact, pupils equal and round Card:  RRR, no M/G/R heard Pulm:  CTAB, no W/R/R, effort normal Abd: Soft, ND, mild global tenderness, +BS, no HSM, no hernia, incisions C/D/I Ext:  No erythema, edema, or tenderness BUE/BLE  Psych: A&Ox3  Skin: no rashes noted, warm and dry  Lab Results:   Recent Labs  02/04/17 0443 02/06/17 0654  WBC 7.5 14.0*  HGB 11.6* 11.2*  HCT 36.4 34.0*  PLT 255 267   BMET  Recent Labs  02/04/17 0443 02/05/17 0247  NA 137 135  K 3.8 3.8  CL 108 107  CO2 17* 19*  GLUCOSE 62* 78  BUN 7 5*  CREATININE 0.81 0.86  CALCIUM 8.6* 8.6*   PT/INR No results for input(s): LABPROT, INR in the last 72 hours. CMP     Component Value Date/Time   NA 135 02/05/2017 0247   K 3.8 02/05/2017 0247   CL 107 02/05/2017 0247   CO2 19 (L) 02/05/2017 0247   GLUCOSE 78 02/05/2017 0247   BUN 5 (L) 02/05/2017 0247   CREATININE 0.86 02/05/2017 0247   CALCIUM 8.6 (L) 02/05/2017 0247   PROT 6.2 (L) 02/05/2017 0247   ALBUMIN 3.0 (L) 02/05/2017 0247   AST 255 (H) 02/05/2017 0247   ALT 532 (H) 02/05/2017 0247   ALKPHOS 103  02/05/2017 0247   BILITOT 5.0 (H) 02/05/2017 0247   GFRNONAA >60 02/05/2017 0247   GFRAA >60 02/05/2017 0247   Lipase     Component Value Date/Time   LIPASE 40 02/03/2017 1340       Studies/Results: Dg Cholangiogram Operative  Result Date: 02/05/2017 CLINICAL DATA:  Intraoperative cholangiogram during laparoscopic cholecystectomy. EXAM: INTRAOPERATIVE CHOLANGIOGRAM FLUOROSCOPY TIME:  14 seconds COMPARISON:  MRCP - 02/04/2017 FINDINGS: Intraoperative cholangiographic images of the right upper abdominal quadrant during laparoscopic cholecystectomy are provided for review. Surgical clips overlie the expected location of the gallbladder fossa. Contrast injection demonstrates selective cannulation of the central aspect of the cystic duct. There is passage of contrast through the central aspect of the cystic duct with filling of a non dilated common bile duct. 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 There is passage of contrast though the CBD and into the descending portion of the duodenum. There is minimal reflux of injected contrast into the common hepatic duct and central aspect of the non dilated intrahepatic biliary system. There is minimal opacification of the pancreatic duct which appears nondilated. There is a persistent nonocclusive filling defect within the distal aspect of the CBD, caudal to the insertion of the cystic duct worrisome for choledocholithiasis. IMPRESSION: Findings worrisome for choledocholithiasis. Correlation with  the operative report is recommended. Electronically Signed   By: Simonne Come M.D.   On: 02/05/2017 11:12   Mr 3d Recon At Scanner  Result Date: 02/04/2017 CLINICAL DATA:  26 year old female with transiently elevated liver function test. Gradually worsening right upper quadrant abdominal pain with some associated nausea. Loss of appetite. EXAM: MRI ABDOMEN WITHOUT AND WITH CONTRAST (INCLUDING MRCP) TECHNIQUE: Multiplanar multisequence MR  imaging of the abdomen was performed both before and after the administration of intravenous contrast. Heavily T2-weighted images of the biliary and pancreatic ducts were obtained, and three-dimensional MRCP images were rendered by post processing. CONTRAST:  14mL MULTIHANCE GADOBENATE DIMEGLUMINE 529 MG/ML IV SOLN COMPARISON:  No priors. FINDINGS: Lower chest: Unremarkable. Hepatobiliary: No discrete cystic or solid hepatic lesions. No intra or extrahepatic biliary ductal dilatation. Common bile duct measures up to 3 mm in the porta hepatis. MRCP images are sub optimal secondary to respiratory motion. Gallbladder appears nearly completely decompressed around a filling defect that measures 2.3 cm in greatest length, presumably a large gallstone. No pericholecystic fluid or surrounding inflammatory changes. Pancreas: No pancreatic mass. No pancreatic ductal dilatation. No pancreatic or peripancreatic fluid or inflammatory changes. Spleen:  Unremarkable. Adrenals/Urinary Tract: Bilateral kidneys and bilateral adrenal glands are normal in appearance. No hydroureteronephrosis in the visualized portions of the abdomen. Stomach/Bowel: Visualized portions are unremarkable. Vascular/Lymphatic: No aneurysm identified in the visualized abdominal vasculature. No lymphadenopathy noted in the visualized portions of the abdomen. Other: No significant volume of ascites noted in the visualized portions of the peritoneal cavity. Musculoskeletal: No aggressive appearing osseous lesions are noted in the visualized portions of the skeleton. IMPRESSION: 1. Cholelithiasis without evidence to suggest an acute cholecystitis at this time. 2. No other acute findings are noted in the abdomen to account for the patient's symptoms. Electronically Signed   By: Trudie Reed M.D.   On: 02/04/2017 16:46   Mr Abdomen Mrcp Vivien Rossetti Contast  Result Date: 02/04/2017 CLINICAL DATA:  26 year old female with transiently elevated liver function test.  Gradually worsening right upper quadrant abdominal pain with some associated nausea. Loss of appetite. EXAM: MRI ABDOMEN WITHOUT AND WITH CONTRAST (INCLUDING MRCP) TECHNIQUE: Multiplanar multisequence MR imaging of the abdomen was performed both before and after the administration of intravenous contrast. Heavily T2-weighted images of the biliary and pancreatic ducts were obtained, and three-dimensional MRCP images were rendered by post processing. CONTRAST:  14mL MULTIHANCE GADOBENATE DIMEGLUMINE 529 MG/ML IV SOLN COMPARISON:  No priors. FINDINGS: Lower chest: Unremarkable. Hepatobiliary: No discrete cystic or solid hepatic lesions. No intra or extrahepatic biliary ductal dilatation. Common bile duct measures up to 3 mm in the porta hepatis. MRCP images are sub optimal secondary to respiratory motion. Gallbladder appears nearly completely decompressed around a filling defect that measures 2.3 cm in greatest length, presumably a large gallstone. No pericholecystic fluid or surrounding inflammatory changes. Pancreas: No pancreatic mass. No pancreatic ductal dilatation. No pancreatic or peripancreatic fluid or inflammatory changes. Spleen:  Unremarkable. Adrenals/Urinary Tract: Bilateral kidneys and bilateral adrenal glands are normal in appearance. No hydroureteronephrosis in the visualized portions of the abdomen. Stomach/Bowel: Visualized portions are unremarkable. Vascular/Lymphatic: No aneurysm identified in the visualized abdominal vasculature. No lymphadenopathy noted in the visualized portions of the abdomen. Other: No significant volume of ascites noted in the visualized portions of the peritoneal cavity. Musculoskeletal: No aggressive appearing osseous lesions are noted in the visualized portions of the skeleton. IMPRESSION: 1. Cholelithiasis without evidence to suggest an acute cholecystitis at this time. 2. No other acute  findings are noted in the abdomen to account for the patient's symptoms.  Electronically Signed   By: Trudie Reedaniel  Entrikin M.D.   On: 02/04/2017 16:46    Anti-infectives: Anti-infectives    Start     Dose/Rate Route Frequency Ordered Stop   02/05/17 0830  ceFAZolin (ANCEF) IVPB 2g/100 mL premix     2 g 200 mL/hr over 30 Minutes Intravenous On call to O.R. 02/05/17 [id number] 02/05/17 0959   02/03/17 2200  metroNIDAZOLE (FLAGYL) IVPB 500 mg     500 mg 100 mL/hr over 60 Minutes Intravenous Every 8 hours 02/03/17 2127     02/03/17 2200  cefTRIAXone (ROCEPHIN) 2 g in dextrose 5 % 50 mL IVPB     2 g 100 mL/hr over 30 Minutes Intravenous Every 24 hours 02/03/17 2127         Assessment/Plan Calculus of bile duct with acute cholecystitis without mention of obstruction S/p lap chole 9/10 Dr. Luisa Hartornett - POD 1 - IOC positive for Choledocholithiasis - going for ERCP today  ID - rocephin/flagyl 9/8>> FEN - IVF, NPO for procedure VTE - SCDs Foley - none Follow up - 2-3 weeks [facility]  Plan - Going for ERCP today. Ok to advance diet as tolerated after ERCP from our standpoint.    LOS: 3 days    Franne FortsBROOKE A Trinity Hyland , [facility] 02/06/2017, 8:27 AM Pager: [phone] Consults: [phone] Mon-Fri 7:00 am-4:30 pm Sat-Sun 7:00 am-11:30 am

## 2017-02-06 NOTE — Anesthesia Procedure Notes (Signed)
Procedure Name: Intubation Date/Time: 02/06/2017 12:58 PM Performed by: Rosiland OzMEYERS, Laurel Harnden Pre-anesthesia Checklist: Patient identified, Emergency Drugs available, Suction available, Patient being monitored and Timeout performed Patient Re-evaluated:Patient Re-evaluated prior to induction Oxygen Delivery Method: Circle system utilized Preoxygenation: Pre-oxygenation with 100% oxygen Induction Type: IV induction Ventilation: Mask ventilation without difficulty Laryngoscope Size: Miller and 3 Grade View: Grade I Tube type: Oral Tube size: 7.0 mm Number of attempts: 1 Airway Equipment and Method: Stylet Placement Confirmation: ETT inserted through vocal cords under direct vision,  positive ETCO2 and breath sounds checked- equal and bilateral Secured at: 21 cm Tube secured with: Tape Dental Injury: Teeth and Oropharynx as per pre-operative assessment

## 2017-02-06 NOTE — Op Note (Signed)
[facility] Patient Name: Sierra Hernandez Procedure Date : 02/06/2017 MRN: [id number] Attending MD: Starr Lake. Myrtie Neither , MD Date of Birth: [birth year] CSN: [id number] Age: 26 Admit Type: Inpatient Procedure:                ERCP Indications:              Filling defect on intraoperative cholangiogram,                            Bile duct stone(s) Providers:                Starr Lake. Myrtie Neither, MD, Dwain Sarna, RN, Kandice Robinsons, Technician Referring MD:              Medicines:                General Anesthesia, Cipro 400 mg IV, Indomethacin                            100 mg PR Complications:            No immediate complications. Estimated Blood Loss:     Estimated blood loss: none. Procedure:                Pre-Anesthesia Assessment:                           - Prior to the procedure, a History and Physical                            was performed, and patient medications and                            allergies were reviewed. The patient's tolerance of                            previous anesthesia was also reviewed. The risks                            and benefits of the procedure and the sedation                            options and risks were discussed with the patient.                            All questions were answered, and informed consent                            was obtained. Prior Anticoagulants: The patient has                            taken heparin, last dose was 1 day prior to  procedure. ASA Grade Assessment: II - A patient                            with mild systemic disease. After reviewing the                            risks and benefits, the patient was deemed in                            satisfactory condition to undergo the procedure.                           After obtaining informed consent, the scope was                            passed under direct vision. Throughout the               procedure, the patient's blood pressure, pulse, and                            oxygen saturations were monitored continuously. The                            [id number] ([id number] scope was introduced through                            the mouth, and used to inject contrast into and                            used to inject contrast into the bile duct. The                            ERCP was accomplished without difficulty. The                            patient tolerated the procedure well. Scope In: Scope Out: Findings:      A scout film of the abdomen was obtained. Surgical clips, consistent       with a previous cholecystectomy, were seen in the area of the right       upper quadrant of the abdomen. The esophagus was successfully intubated       under direct vision. The scope was advanced to a normal major papilla in       the descending duodenum without detailed examination of the pharynx,       larynx and associated structures, and upper GI tract. The upper GI tract       was grossly normal. 0.035 inch x 260 cm straight Hydra Jagwire was       passed into the biliary tree after several passes into the PD. The       traction (standard) sphincterotome was passed over the guidewire and the       bile duct was then deeply cannulated. Contrast was injected. I       personally interpreted the bile duct images. There was brisk flow of       contrast through  the ducts. Image quality was excellent. Contrast       extended to the hepatic ducts. The common bile duct contained filling       defect(s) thought to be a stone. A 6 mm biliary sphincterotomy was made       with a traction (standard) sphincterotome. There was no       post-sphincterotomy bleeding. The biliary tree was swept with a 9 mm       balloon starting at the bifurcation. Multiple soft cholesterol stone       fragments were removed over three passes. No more material was removed       with the fourth pass. No stones  remained, confirmed on an occlusion       cholangiogram. The total fluoroscopy exposure time was 6 minutes and 14       seconds (pelvis shielded). Impression:               - A filling defect consistent with a stone was seen                            on the cholangiogram.                           - Choledocholithiasis was found. Complete removal                            was accomplished by biliary sphincterotomy and                            balloon extraction.                           - A biliary sphincterotomy was performed.                           - The biliary tree was swept. Recommendation:           - Clear liquid diet. Procedure Code(s):        --- Professional ---                           [phone], Endoscopic retrograde                            cholangiopancreatography (ERCP); with removal of                            calculi/debris from biliary/pancreatic duct(s)                           43262, Endoscopic retrograde                            cholangiopancreatography (ERCP); with                            sphincterotomy/papillotomy                           19147, Endoscopic catheterization of the  biliary                            ductal system, radiological supervision and                            interpretation Diagnosis Code(s):        --- Professional ---                           R93.2, Abnormal findings on diagnostic imaging of                            liver and biliary tract                           K80.50, Calculus of bile duct without cholangitis                            or cholecystitis without obstruction CPT copyright 2016 American Medical Association. All rights reserved. The codes documented in this report are preliminary and upon coder review may  be revised to meet current compliance requirements. Rajeev Escue L. Myrtie Neither, MD 02/06/2017 2:11:01 PM This report has been signed electronically. Number of Addenda: 0

## 2017-02-06 NOTE — Progress Notes (Addendum)
Sore from surgery.  No n/v or sweats.    For ERCP today for + CBD stones on IOC On Rocephin, Flagyl.   Post ERCP Indocin ordered.   Jennye MoccasinSarah Gribbin PA-C  ________________________________ ERCP today. - H. Danis

## 2017-02-06 NOTE — Transfer of Care (Signed)
Immediate Anesthesia Transfer of Care Note  Patient: Sierra Hernandez  Procedure(s) Performed: Procedure(s): ENDOSCOPIC RETROGRADE CHOLANGIOPANCREATOGRAPHY (ERCP) (N/A)  Patient Location: Endoscopy Unit  Anesthesia Type:General  Level of Consciousness: awake and patient cooperative  Airway & Oxygen Therapy: Patient Spontanous Breathing  Post-op Assessment: Report given to RN and Post -op Vital signs reviewed and stable  Post vital signs: Reviewed and stable  Last Vitals:  Vitals:   02/06/17 0627 02/06/17 1217  BP: 107/64 130/63  Pulse: 68   Resp: 17 18  Temp: 36.9 C 36.7 C  SpO2: 98% 98%    Last Pain:  Vitals:   02/06/17 1217  TempSrc: Oral  PainSc:       Patients Stated Pain Goal: 3 (02/06/17 0348)  Complications: No apparent anesthesia complications

## 2017-02-06 NOTE — H&P (View-Only) (Signed)
    Patient went for cholecystectomy today. IOC shows nonocclusive filling defect in distal CBD. She will need an ERCP with removal of CBD stones, if present. Mother in room, patient having nausea / vomiting. Discussed need for ERCP. Per Mother, ERCP and its associated risks / benefits were already explained by Dr. Benson Norway covering for [place] over the weekend. Patient nor Mother have any additional questions.   ERCP is scheduled for tomorrow at 1:00pm with MAC. I stopped her Lovenox. She is already getting antibiotics (rocephin and flagyl).

## 2017-02-06 NOTE — Anesthesia Preprocedure Evaluation (Addendum)
Anesthesia Evaluation  Patient identified by MRN, date of birth, ID band Patient awake    Reviewed: Allergy & Precautions, NPO status , Patient's Chart, lab work & pertinent test results  Airway Mallampati: II  TM Distance: >3 FB Neck ROM: Full    Dental  (+) Teeth Intact, Dental Advisory Given   Pulmonary neg pulmonary ROS,    breath sounds clear to auscultation       Cardiovascular negative cardio ROS   Rhythm:Regular Rate:Normal     Neuro/Psych negative neurological ROS     GI/Hepatic Neg liver ROS, GERD  ,  Endo/Other  negative endocrine ROS  Renal/GU negative Renal ROS     Musculoskeletal negative musculoskeletal ROS (+)   Abdominal   Peds  Hematology negative hematology ROS (+)   Anesthesia Other Findings Day of surgery medications reviewed with the patient.  Reproductive/Obstetrics                            Anesthesia Physical Anesthesia Plan  ASA: II  Anesthesia Plan: General   Post-op Pain Management:    Induction: Intravenous  PONV Risk Score and Plan: 4 or greater and Ondansetron, Dexamethasone, Midazolam, Scopolamine patch - Pre-op and Treatment may vary due to age or medical condition  Airway Management Planned: Oral ETT  Additional Equipment:   Intra-op Plan:   Post-operative Plan: Extubation in OR  Informed Consent: I have reviewed the patients History and Physical, chart, labs and discussed the procedure including the risks, benefits and alternatives for the proposed anesthesia with the patient or authorized representative who has indicated his/her understanding and acceptance.   Dental advisory given  Plan Discussed with: CRNA  Anesthesia Plan Comments:         Anesthesia Quick Evaluation

## 2017-02-06 NOTE — Discharge Instructions (Addendum)
Please arrive at least 30 min before your appointment to complete your check in paperwork.  If you are unable to arrive 30 min prior to your appointment time we may have to cancel or reschedule you. ° °LAPAROSCOPIC SURGERY: POST OP INSTRUCTIONS  °1. DIET: Follow a light bland diet the first 24 hours after arrival home, such as soup, liquids, crackers, etc. Be sure to include lots of fluids daily. Avoid fast food or heavy meals as your are more likely to get nauseated. Eat a low fat the next few days after surgery.  °2. Take your usually prescribed home medications unless otherwise directed. °3. PAIN CONTROL:  °1. Pain is best controlled by a usual combination of three different methods TOGETHER:  °1. Ice/Heat °2. Over the counter pain medication °3. Prescription pain medication °2. Most patients will experience some swelling and bruising around the incisions. Ice packs or heating pads (30-60 minutes up to 6 times a day) will help. Use ice for the first few days to help decrease swelling and bruising, then switch to heat to help relax tight/sore spots and speed recovery. Some people prefer to use ice alone, heat alone, alternating between ice & heat. Experiment to what works for you. Swelling and bruising can take several weeks to resolve.  °3. It is helpful to take an over-the-counter pain medication regularly for the first few weeks. Choose one of the following that works best for you:  °1. Naproxen (Aleve, etc) Two 220mg tabs twice a day °2. Ibuprofen (Advil, etc) Three 200mg tabs four times a day (every meal & bedtime) °3. Acetaminophen (Tylenol, etc) 500-650mg four times a day (every meal & bedtime) °4. A prescription for pain medication (such as oxycodone, hydrocodone, etc) should be given to you upon discharge. Take your pain medication as prescribed.  °1. If you are having problems/concerns with the prescription medicine (does not control pain, nausea, vomiting, rash, itching, etc), please call us [phone] to see if we need to switch you to a different pain medicine that will work better for you and/or control your side effect better. °2. If you need a refill on your pain medication, please contact your pharmacy. They will contact our office to request authorization. Prescriptions will not be filled after 5 pm or on week-ends. °4. Avoid getting constipated. Between the surgery and the pain medications, it is common to experience some constipation. Increasing fluid intake and taking a fiber supplement (such as Metamucil, Citrucel, FiberCon, MiraLax, etc) 1-2 times a day regularly will usually help prevent this problem from occurring. A mild laxative (prune juice, Milk of Magnesia, MiraLax, etc) should be taken according to package directions if there are no bowel movements after 48 hours.  °5. Watch out for diarrhea. If you have many loose bowel movements, simplify your diet to bland foods & liquids for a few days. Stop any stool softeners and decrease your fiber supplement. Switching to mild anti-diarrheal medications (Kayopectate, Pepto Bismol) can help. If this worsens or does not improve, please call us. °6. Wash / shower every day. You may shower over the dressings as they are waterproof. Continue to shower over incision(s) after the dressing is off. If there is glue over the incisions try not to pick it off, let it fall off naturally. °7. Remove your waterproof bandages 2 days after surgery. You may leave the incision open to air. You may replace a dressing/Band-Aid to cover the incision for comfort if you wish.  °8. ACTIVITIES as tolerated:  °  1. You may resume regular (light) daily activities beginning the next day--such as daily self-care, walking, climbing stairs--gradually increasing activities as tolerated. If you can walk 30 minutes without difficulty, it is safe to try more intense activity such as jogging, treadmill, bicycling, low-impact aerobics, swimming, etc. 2. Save the most intensive and  strenuous activity for last such as sit-ups, heavy lifting, contact sports, etc Refrain from any heavy lifting or straining until you are off narcotics for pain control. For the first 2-3 weeks do not lift over 10-15lb.  3. DO NOT PUSH THROUGH PAIN. Let pain be your guide: If it hurts to do something, don't do it. Pain is your body warning you to avoid that activity for another week until the pain goes down. 4. You may drive when you are no longer taking prescription pain medication, you can comfortably wear a seatbelt, and you can safely maneuver your car and apply brakes. 5. You may have sexual intercourse when it is comfortable.  9. FOLLOW UP in our office  1. Please call [facility] at [phone] to set up an appointment to see your surgeon in the office for a follow-up appointment approximately 2-3 weeks after your surgery. 2. Make sure that you call for this appointment the day you arrive home to insure a convenient appointment time.      10. IF YOU HAVE DISABILITY OR FAMILY LEAVE FORMS, BRING THEM TO THE               OFFICE FOR PROCESSING.   WHEN TO CALL us [phone]:  1. Poor pain control 2. Reactions / problems with new medications (rash/itching, nausea, etc)  3. Fever over 101.5 F (38.5 C) 4. Inability to urinate 5. Nausea and/or vomiting 6. Worsening swelling or bruising 7. Continued bleeding from incision. 8. Increased pain, redness, or drainage from the incision  The clinic staff is available to answer your questions during regular business hours (8:30am-5pm). Please dont hesitate to call and ask to speak to one of our nurses for clinical concerns.  If you have a medical emergency, go to the nearest emergency room or call 911.  A surgeon from [facility] is always on call at the [facility], [place]  [address], [address], [place], [place] [address] ?  MAIN: [phone] ? TOLL FREE: [phone] ?  FAX [phone]    [email or web address]   Endoscopic Retrograde Cholangiopancreatogram, Care After This sheet gives you information about how to care for yourself after your procedure. Your health care provider may also give you more specific instructions. If you have problems or questions, contact your health care provider. What can I expect after the procedure? After the procedure, it is common to have:  Soreness in your throat.  Nausea.  Bloating.  Dizziness.  Tiredness (fatigue).  Follow these instructions at home:  Take over-the-counter and prescription medicines only as told by your health care provider.  Do not drive for 24 hours if you were given a medicine to help you relax (sedative) during your procedure. Have someone stay with you for 24 hours after the procedure.  Return to your normal activities as told by your health care provider. Ask your health care provider what activities are safe for you.  Return to eating what you normally do as soon as you feel well enough or as told by your health care provider.  Keep all follow-up visits as told by your health care provider. This is  important. Contact a health care provider if:  You have pain in your abdomen that does not get better with medicine.  You develop signs of infection, such as: ? Chills. ? Feeling unwell. Get help right away if:  You have difficulty swallowing.  You have worsening pain in your throat, chest, or abdomen.  You vomit bright red blood or a substance that looks like coffee grounds.  You have bloody or very black stools.  You have a fever.  You have a sudden increase in swelling (bloating) in your abdomen. Summary  After the procedure, it is common to feel tired and to have some discomfort in your throat.  Contact your health care provider if you have signs of infection--such as chills or feeling unwell--or if you have pain that does not improve with medicine.  Get help right away if you  have trouble swallowing, worsening pain, bloody or black vomit, bloody or black stools, a fever, or increased swelling in your abdomen.  Keep all follow-up visits as told by your health care provider. This is important. This information is not intended to replace advice given to you by your health care provider. Make sure you discuss any questions you have with your health care provider. Document Released: 03/05/2013 Document Revised: 04/03/2016 Document Reviewed: 04/03/2016 Elsevier Interactive Patient Education  2017 [employer]

## 2017-02-06 NOTE — Addendum Note (Signed)
Addendum  created 02/06/17 1354 by Philomena CourseWhite, Ayleen Mckinstry Tena Daiwik Buffalo, CRNA   Anesthesia Intra Meds edited

## 2017-02-06 NOTE — Progress Notes (Signed)
   Subjective: Pt underwent lap chole yesterday, notes soreness throughout abdomen with intermittent sharp pain throughout the night, improved with time and pain medication. Also had an episode of emesis shortly after procedure, was able to tolerate liquid diet later on. She is currently NPO for ERCP today. Her itching has improved, still present in feet.     Objective:  Vital signs in last 24 hours: Vitals:   02/05/17 1230 02/05/17 1410 02/05/17 2129 02/06/17 0627  BP: 125/77 124/68 121/70 107/64  Pulse: 62 75 65 68  Resp: 17 17 18 17   Temp: 98.1 F (36.7 C) 98.4 F (36.9 C) 98.3 F (36.8 C) 98.4 F (36.9 C)  TempSrc:  Oral Oral Oral  SpO2: 98% 100% 100% 98%  Weight:      Height:       Physical Exam  Constitutional: She is oriented to person, place, and time.  Resting in bed, no acute distress   HENT:  Head: Normocephalic and atraumatic.  Eyes: No scleral icterus.  Cardiovascular: Normal rate, regular rhythm and normal heart sounds.   Pulmonary/Chest: Effort normal and breath sounds normal. No respiratory distress.  Abdominal: Soft.  Tenderness to palpation to all quadrants, worse in RUQ, clean surgical port-site incisions, + BS   Neurological: She is alert and oriented to person, place, and time.  Skin: Capillary refill takes less than 2 seconds.     Assessment/Plan:  Choledocholithiasis, cholecystitis  Pt presented with acute worsening of abd pain, known cholelithiasis. Found to have T. bili 5.5, AlkP 128, AST 320, ALT 529, consistent with choledocholithiasis. She is s/p laparoscopic cholecystectomy, intra-op cholangiogram showed non-obstructing CBD stone. She is scheduled to undergo ERCP with stone removal today. Her bilirubin and LFTs are slowly downtrending.   --Continue Ceftriaxone, Flagyl  --F/u ERCP today --CMP, CBC --Zofran 4 mg q6hr prn, phenergan 12.5 mg q6hrs prn  --Percocet 5-325 mg q4 hrs prn   Transaminitis Current elevations likely related to  obstruction as above. Pt has a history of transient transaminitis attributed to medications (minocycline, suliden for acne) with unremarkable work up (incl  ferritin, alpha-1 antitrypsin, HBsAg, HCV ab, AMA, ASMA)  and resolution after medication discontinuation.  --Monitor as above     Dispo: Anticipated discharge in approximately 1-2 day(s).   Ginger CarneHarden, Amelda Hapke, MD 02/06/2017, 9:50 AM Pager: [phone]

## 2017-02-06 NOTE — Interval H&P Note (Signed)
History and Physical Interval Note:  02/06/2017 12:44 PM  Sierra Hernandez  has presented today for surgery, with the diagnosis of choledocholithiasis  The various methods of treatment have been discussed with the patient and family. After consideration of risks, benefits and other options for treatment, the patient has consented to  Procedure(s): ENDOSCOPIC RETROGRADE CHOLANGIOPANCREATOGRAPHY (ERCP) (N/A) as a surgical intervention .  The patient's history has been reviewed, patient examined, no change in status, stable for surgery.  I have reviewed the patient's chart and labs.  Questions were answered to the patient's satisfaction.     Charlie PitterHenry L Danis III

## 2017-02-07 ENCOUNTER — Inpatient Hospital Stay (HOSPITAL_COMMUNITY): Payer: Managed Care, Other (non HMO)

## 2017-02-07 DIAGNOSIS — K859 Acute pancreatitis without necrosis or infection, unspecified: Secondary | ICD-10-CM

## 2017-02-07 DIAGNOSIS — K858 Other acute pancreatitis without necrosis or infection: Secondary | ICD-10-CM

## 2017-02-07 DIAGNOSIS — R945 Abnormal results of liver function studies: Secondary | ICD-10-CM

## 2017-02-07 LAB — COMPREHENSIVE METABOLIC PANEL WITH GFR
ALT: 754 U/L — ABNORMAL HIGH (ref 14–54)
AST: 448 U/L — ABNORMAL HIGH (ref 15–41)
Albumin: 3.1 g/dL — ABNORMAL LOW (ref 3.5–5.0)
Alkaline Phosphatase: 88 U/L (ref 38–126)
Anion gap: 9 (ref 5–15)
BUN: 10 mg/dL (ref 6–20)
CO2: 24 mmol/L (ref 22–32)
Calcium: 7.9 mg/dL — ABNORMAL LOW (ref 8.9–10.3)
Chloride: 99 mmol/L — ABNORMAL LOW (ref 101–111)
Creatinine, Ser: 0.64 mg/dL (ref 0.44–1.00)
GFR calc Af Amer: >60 mL/min
GFR calc non Af Amer: >60 mL/min
Glucose, Bld: 130 mg/dL — ABNORMAL HIGH (ref 65–99)
Potassium: 3.6 mmol/L (ref 3.5–5.1)
Sodium: 132 mmol/L — ABNORMAL LOW (ref 135–145)
Total Bilirubin: 2.9 mg/dL — ABNORMAL HIGH (ref 0.3–1.2)
Total Protein: 6.1 g/dL — ABNORMAL LOW (ref 6.5–8.1)

## 2017-02-07 LAB — CBC
HEMATOCRIT: 41.4 % (ref 36.0–46.0)
HEMOGLOBIN: 13.7 g/dL (ref 12.0–15.0)
MCH: 29.1 pg (ref 26.0–34.0)
MCHC: 33.1 g/dL (ref 30.0–36.0)
MCV: 87.9 fL (ref 78.0–100.0)
Platelets: 340 10*3/uL (ref 150–400)
RBC: 4.71 MIL/uL (ref 3.87–5.11)
RDW: 12.5 % (ref 11.5–15.5)
WBC: 18.7 10*3/uL — AB (ref 4.0–10.5)

## 2017-02-07 LAB — LIPASE, BLOOD: Lipase: 1650 U/L — ABNORMAL HIGH (ref 11–51)

## 2017-02-07 MED ORDER — KETOROLAC TROMETHAMINE 15 MG/ML IJ SOLN
15.0000 mg | Freq: Four times a day (QID) | INTRAMUSCULAR | Status: AC
  Administered 2017-02-07 – 2017-02-10 (×9): 15 mg via INTRAVENOUS
  Filled 2017-02-07 (×12): qty 1

## 2017-02-07 MED ORDER — SENNOSIDES-DOCUSATE SODIUM 8.6-50 MG PO TABS: 2.0000 | ORAL_TABLET | Freq: Two times a day (BID) | ORAL | Status: DC

## 2017-02-07 MED ORDER — HYDROMORPHONE HCL 1 MG/ML IJ SOLN
0.5000 mg | Freq: Once | INTRAMUSCULAR | Status: AC
  Administered 2017-02-07: 0.5 mg via INTRAVENOUS
  Filled 2017-02-07: qty 1

## 2017-02-07 MED ORDER — LACTATED RINGERS IV SOLN: INTRAVENOUS | Status: DC

## 2017-02-07 MED ORDER — METOCLOPRAMIDE HCL 5 MG/ML IJ SOLN
5.0000 mg | Freq: Four times a day (QID) | INTRAMUSCULAR | Status: DC | PRN
  Administered 2017-02-07: 5 mg via INTRAVENOUS
  Filled 2017-02-07: qty 2

## 2017-02-07 MED ORDER — BISACODYL 10 MG RE SUPP
10.0000 mg | Freq: Every day | RECTAL | Status: DC | PRN
  Administered 2017-02-08: 10 mg via RECTAL
  Filled 2017-02-07: qty 1

## 2017-02-07 MED ORDER — LACTATED RINGERS IV SOLN
INTRAVENOUS | Status: DC
  Administered 2017-02-07: 12:00:00 via INTRAVENOUS

## 2017-02-07 MED ORDER — ENOXAPARIN SODIUM 40 MG/0.4ML ~~LOC~~ SOLN
40.0000 mg | SUBCUTANEOUS | Status: DC
  Administered 2017-02-07 – 2017-02-13 (×7): 40 mg via SUBCUTANEOUS
  Filled 2017-02-07 (×7): qty 0.4

## 2017-02-07 MED ORDER — LACTATED RINGERS IV SOLN
INTRAVENOUS | Status: DC
  Administered 2017-02-07 – 2017-02-11 (×13): via INTRAVENOUS

## 2017-02-07 MED ORDER — SODIUM CHLORIDE 0.45 % IV SOLN: INTRAVENOUS | Status: DC

## 2017-02-07 MED ORDER — SENNOSIDES-DOCUSATE SODIUM 8.6-50 MG PO TABS
1.0000 | ORAL_TABLET | Freq: Every day | ORAL | Status: DC
  Administered 2017-02-07 (×2): 1 via ORAL
  Filled 2017-02-07 (×2): qty 1

## 2017-02-07 MED ORDER — BISACODYL 10 MG RE SUPP
10.0000 mg | Freq: Once | RECTAL | Status: AC
  Administered 2017-02-07: 10 mg via RECTAL
  Filled 2017-02-07: qty 1

## 2017-02-07 MED ORDER — HYDROMORPHONE HCL 1 MG/ML IJ SOLN
0.5000 mg | INTRAMUSCULAR | Status: DC | PRN
  Administered 2017-02-07: 0.5 mg via INTRAVENOUS
  Filled 2017-02-07: qty 1

## 2017-02-07 NOTE — Progress Notes (Signed)
Daily Rounding Note  02/07/2017, 8:13 AM  LOS: 4 days   SUBJECTIVE:   Chief complaint: abdominal pain worsened overnight along with repeated bilious emesis.  Pain is "under diaphragm" bil, and in inguinal areas.  Temporary relief with Oxycodone, but vomiting this more recently.  Feels bloated.  No stools or flatus.  No sweats or chills   OBJECTIVE:         Vital signs in last 24 hours:    Temp:  [98.1 F (36.7 C)-99 F (37.2 C)] 98.6 F (37 C) (09/12 0447) Pulse Rate:  [70-97] 81 (09/12 0447) Resp:  [16-20] 17 (09/12 0447) BP: (122-143)/(63-86) 129/71 (09/12 0447) SpO2:  [96 %-99 %] 98 % (09/12 0447) Weight:  [59 kg (130 lb)] 59 kg (130 lb) (09/11 1217) Last BM Date: 02/03/17 Filed Weights   02/04/17 0600 02/06/17 1217  Weight: 59 kg (130 lb 1.6 oz) 59 kg (130 lb)   General: looks uncomfortable, mild-moderately ill but not toxic   Heart: RRR, not tachy.   Chest: clear bil.  No difficulty breathing, no cough Abdomen: distended and a bit firm.  Tender diffusely, >> in upper abdomen.  No guard/rebound.  Quiet, no BS.    Extremities: no CCE Neuro/Psych:  Oriented x 3.  No weakness or tremor  Intake/Output from previous day: 09/11 0701 - 09/12 0700 In: 1671.7 [P.O.:220; I.V.:1451.7] Out: 0   Intake/Output this shift: No intake/output data recorded.  Lab Results:  Recent Labs  02/06/17 0654 02/07/17 0655  WBC 14.0* 18.7*  HGB 11.2* 13.7  HCT 34.0* 41.4  PLT 267 340   BMET  Recent Labs  02/05/17 0247 02/06/17 0654 02/07/17 0655  NA 135 135 132*  K 3.8 3.5 3.6  CL 107 101 99*  CO2 19* 24 24  GLUCOSE 78 100* 130*  BUN 5* 7 10  CREATININE 0.86 0.76 0.64  CALCIUM 8.6* 8.4* 7.9*   LFT  Recent Labs  02/05/17 0247 02/06/17 0654 02/07/17 0655  PROT 6.2* 5.7* 6.1*  ALBUMIN 3.0* 2.9* 3.1*  AST 255* 220* 448*  ALT 532* 529* 754*  ALKPHOS 103 94 88  BILITOT 5.0* 3.5* 2.9*   PT/INR No results  for input(s): LABPROT, INR in the last 72 hours. Hepatitis Panel No results for input(s): HEPBSAG, HCVAB, HEPAIGM, HEPBIGM in the last 72 hours.  Studies/Results: Dg Abd 1 View  Result Date: 02/07/2017 CLINICAL DATA:  Abdominal distension.  Nausea and vomiting. EXAM: ABDOMEN - 1 VIEW COMPARISON:  Endoscopic procedural images.  MRI 02/04/2017. FINDINGS: Clips in the right upper quadrant consistent with previous cholecystectomy. Question small densities just medial to the clips which could represent residual ductal contrast or ductal stones. Other previously administered contrast has passed into the intestine, having reached the colon. No evidence of small bowel obstruction. IMPRESSION: Unremarkable bowel gas pattern. Previously administered contrast has reached the colon. Right upper quadrant clips consistent with previous cholecystectomy. Small densities medial to the clips could be ductal stones or some retained contrast. Electronically Signed   By: Paulina Fusi M.D.   On: 02/07/2017 07:38   Dg Cholangiogram Operative  Result Date: 02/05/2017 CLINICAL DATA:  Intraoperative cholangiogram during laparoscopic cholecystectomy. EXAM: INTRAOPERATIVE CHOLANGIOGRAM FLUOROSCOPY TIME:  14 seconds COMPARISON:  MRCP - 02/04/2017 FINDINGS: Intraoperative cholangiographic images of the right upper abdominal quadrant during laparoscopic cholecystectomy are provided for review. Surgical clips overlie the expected location of the gallbladder fossa. Contrast injection demonstrates selective cannulation of the central aspect  of the cystic duct. There is passage of contrast through the central aspect of the cystic duct with filling of a non dilated common bile duct. 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 There is passage of contrast though the CBD and into the descending portion of the duodenum. There is minimal reflux of injected contrast into the common hepatic duct and central aspect of the non  dilated intrahepatic biliary system. There is minimal opacification of the pancreatic duct which appears nondilated. There is a persistent nonocclusive filling defect within the distal aspect of the CBD, caudal to the insertion of the cystic duct worrisome for choledocholithiasis. IMPRESSION: Findings worrisome for choledocholithiasis. Correlation with the operative report is recommended. Electronically Signed   By: Simonne Come M.D.   On: 02/05/2017 11:12   Dg Ercp With Sphincterotomy  Result Date: 02/06/2017 CLINICAL DATA:  26 year old female with a history of choledocholithiasis EXAM: ERCP TECHNIQUE: Multiple spot images obtained with the fluoroscopic device and submitted for interpretation post-procedure. FLUOROSCOPY TIME:  Fluoroscopy Time:  0 minutes 14 seconds COMPARISON:  Intraoperative cholangiogram 02/05/2017 FINDINGS: Limited images during ERCP. Initial image demonstrates endoscope projecting over the upper abdomen with cannulation of the ampulla and retrograde infusion of contrast around a safety wire. Subsequently there is deployment of a retrieval balloon, with incomplete opacification of the extrahepatic biliary system. Surgical changes of cholecystectomy. IMPRESSION: Limited images during ERCP demonstrates partial opacification of the extrahepatic biliary ducts, with deployment of retrieval balloon. These images were submitted for radiologic interpretation only. Please see the procedural report for the amount of contrast and the fluoroscopy time utilized. Electronically Signed   By: Gilmer Mor D.O.   On: 02/06/2017 15:48   Scheduled Meds: . Chlorhexidine Gluconate Cloth  6 each Topical Once  . ibuprofen  400 mg Oral Q6H  . indomethacin  100 mg Rectal Once  . senna-docusate  1 tablet Oral QHS   Continuous Infusions: PRN Meds:.ondansetron (ZOFRAN) IV, oxyCODONE-acetaminophen, promethazine   ASSESMENT:   *  Cholelithiasis, cholecystitis.  02/05/17 lap chole, CBD stones on IOC.    *   Choledocholithiasis.  02/06/17 ERCP with sphinct, stone extraction.  No remaining stones at final cholangiogram.  Indocin PR given post ERCP. T bili down, transaminases, WBCs up today.  No fever.   abd pain and nausea worse, r/o post ERCP pancreatitis.     PLAN   *  Lipase level this AM, r/o post ERCP pancreatitis.  LFTs, CBC in AM.    *  Switch IVF to LR at 200/hour for 8 hours then go to 150/hour.  Bisacodyl suppository now and prn.    No abx for now.   For pain has Toradol and Percocet prn, single dose Dilaudid to be given now.    Addendum 1:54 PM: Lipase is 1650 c/w post ERCP pancreatitis.  Pt still quite uncomfortable and Toradol marginally/moderately effective. PRN Dilaudid helped this AM but "knocked her out".  She is diaphoretic. Lungs clear, not tachycardic. Will keep IVF at 200/hour.  Lipase added to AM labs.  Sips clears ok, though she is not interested in PO.     Sierra Hernandez  02/07/2017, 8:13 AM Pager: [phone]   I have discussed the case with the PA, and that is the plan I formulated. I personally interviewed and examined the patient.  Unfortunately has developed post-ERCP pancreatitis.  Pulse 70's now, no respiratory distress, but significant epigastric pain and tenderness.  Explained treatment of time, IVF, meds to control pain  and nausea.  We will follow her daily until ready for discharge, though that may be a couple more days.    Charlie PitterHenry L Danis III Pager [phone]  Mon-Fri 8a-5p [phone] after 5p, weekends, holidays

## 2017-02-07 NOTE — Progress Notes (Signed)
Pt has been c/o nausea and vomiting, she was give phenergan by the first shift nurse, which seem not effective I gave her percocet , throw up twice looks bile, complained of pain wanted pain med by iv, doctor from [facility]  Made aware of pt complain told me on the phone to give another dose of phenergan and then give her percocet, pt throw out again thirty minutes after the phenergan per pt still in pain and cannot take anything orally because she still feels nauseous and also wants doctor to come by and review her, on call from [facility] has been informed, will continue to monitor pt

## 2017-02-07 NOTE — Progress Notes (Addendum)
Paged by nurse that the patient has been having nausea and bilious vomiting. Went to patient's room and saw that she was producing bilious vomitus. The patient states that she has not passed gas nor had a bowel movement since 02/02/17. The patient complains of pain across her upper abdomen and in her lower abdomen towards the groin. The patient denies any pain while urinating. She states that the percocet did not alleviate her pain and she feels like she most likely vomited the medication.  Bowel sounds present in the lower abdomen, tenderness to palpation in the lower abdomen, the sites of insertion of laparoscope did not appear infected.  -Abdominal x-ray ordered  -IV Dilaudid given -Zofran given -Attempted to call GI, but was not able to reach them -Sennakot started   Lorenso CourierVahini Kadasia Kassing, MD Internal Medicine PGY1 Pager:[phone] 02/07/2017, 2:47 AM

## 2017-02-07 NOTE — Anesthesia Postprocedure Evaluation (Signed)
Anesthesia Post Note  Patient: Sierra Hernandez  Procedure(s) Performed: Procedure(s) (LRB): ENDOSCOPIC RETROGRADE CHOLANGIOPANCREATOGRAPHY (ERCP) (N/A)     Patient location during evaluation: PACU Anesthesia Type: General Level of consciousness: awake and alert Pain management: pain level controlled Vital Signs Assessment: post-procedure vital signs reviewed and stable Respiratory status: spontaneous breathing, nonlabored ventilation, respiratory function stable and patient connected to nasal cannula oxygen Cardiovascular status: blood pressure returned to baseline and stable Postop Assessment: no signs of nausea or vomiting Anesthetic complications: no    Last Vitals:  Vitals:   02/06/17 2138 02/07/17 0447  BP: 122/72 129/71  Pulse: 77 81  Resp: 17 17  Temp: 37.2 C 37 C  SpO2: 98% 98%    Last Pain:  Vitals:   02/07/17 0447  TempSrc: Oral  PainSc:                  Shelton SilvasKevin D Hollis

## 2017-02-07 NOTE — Progress Notes (Signed)
   Subjective: Pt underwent ERCP with CBD stone removal, tolerated the procedure well. Last night, she did experience abdominal pain, nausea, and emesis. Her pain is constant, intermittently sharp, located in both upper and lower quadrants. She received a dose of IV pain medication d/t emesis shortly after taking oral medication. KUB ordered. She states her last BM was on 9/7, was not passing flatus. She has continued to have nausea and emesis this morning (total 6-7 episodes last night and this morning), unable to tolerate her PO medications.   Objective:  Vital signs in last 24 hours: Vitals:   02/06/17 1440 02/06/17 1515 02/06/17 2138 02/07/17 0447  BP: 137/86 140/81 122/72 129/71  Pulse: 78 73 77 81  Resp: 18 19 17 17   Temp:   99 F (37.2 C) 98.6 F (37 C)  TempSrc:   Oral Oral  SpO2: 98% 99% 98% 98%  Weight:      Height:       Physical Exam  Constitutional: She is oriented to person, place, and time.  Resting in bed  HENT:  Head: Normocephalic and atraumatic.  Eyes: No scleral icterus.  Cardiovascular: Normal rate, regular rhythm and normal heart sounds.   Pulmonary/Chest: Effort normal and breath sounds normal. No respiratory distress.  Abdominal:  Increased distension and tenderness to light palpation throughout all quadrants, +BS   Neurological: She is alert and oriented to person, place, and time.  Skin: Capillary refill takes less than 2 seconds.    Assessment/Plan:  Choledocholithiasis, cholecystitis  Pt presented with acute worsening of abd pain, known cholelithiasis. Found to have T. bili 5.5, AlkP 128, AST 320, ALT 529, consistent with choledocholithiasis. She is s/p laparoscopic cholecystectomy, intra-op cholangiogram showed non-obstructing CBD stone. Subsequent ERCP was performed with stone removal and sphincterotomy. Her bilirubin and LFTs are slowly downtrending. Having nausea/vomiting during post-procedure course, KUB showed non-obstructive bowel gas pattern.  This may be ileus/consitpation with associated pain, however, ERCP associated pancreatitis is also a concern.   --Lipase    --IVF  --CMP, CBC --IV Zofran 4 mg q6hr prn, IV phenergan 12.5 mg q6hrs prn, IV Reglan 5 mg q6hrs prn   --IV Toradol 15 mg q6hrs, Percocet 5-325 mg q4 hrs prn  --Dulcolax suppository 10 mg, senna-docusate  --Encourage ambulation   Transaminitis Elevations on presentation likely related to choledocholithiasis as above. Pt also has a history of transient transaminitis attributed to medications (minocycline, suliden for acne) with unremarkable work up (incl  ferritin, alpha-1 antitrypsin, HBsAg, HCV ab, AMA, ASMA)  and resolution after medication discontinuation. Down-trending but increased post-ERCP which is not unexpected following this procedure.   --Monitor as above     Dispo: Anticipated discharge in approximately 1-2 day(s).   Ginger CarneHarden, Kinney Sackmann, MD 02/07/2017, 7:23 AM Pager: [phone]

## 2017-02-07 NOTE — Progress Notes (Signed)
Patient ID: Sierra Hernandez, female   DOB: [birth year], 26 y.o.   MRN: [id number]  [facility]  1 Day Post-Op  Subjective: CC- choledocholithiasis Patient sitting up in chair. Mother at bedside. States that she has had multiple episodes of n/v since ERCP. She reports abdominal pain and distension. No flatus or BM. Pain is mostly in her lower abdomen but she also reports intermittent sharp RUQ pains.  Objective: Vital signs in last 24 hours: Temp:  [98.1 F (36.7 C)-99 F (37.2 C)] 98.6 F (37 C) (09/12 0447) Pulse Rate:  [70-97] 81 (09/12 0447) Resp:  [16-20] 17 (09/12 0447) BP: (122-143)/(63-86) 129/71 (09/12 0447) SpO2:  [96 %-99 %] 98 % (09/12 0447) Weight:  [130 lb (59 kg)] 130 lb (59 kg) (09/11 1217) Last BM Date: 02/03/17  Intake/Output from previous day: 09/11 0701 - 09/12 0700 In: 1671.7 [P.O.:220; I.V.:1451.7] Out: 0  Intake/Output this shift: No intake/output data recorded.  PE: Gen:  Alert, NAD, pleasant HEENT: EOM's intact, pupils equal and round Pulm:  effort normal Abd: Soft, distended, few BS heard, incisions C/D/I, mild global tenderness Skin: no rashes noted, warm and dry  Lab Results:   Recent Labs  02/06/17 0654 02/07/17 0655  WBC 14.0* 18.7*  HGB 11.2* 13.7  HCT 34.0* 41.4  PLT 267 340   BMET  Recent Labs  02/06/17 0654 02/07/17 0655  NA 135 132*  K 3.5 3.6  CL 101 99*  CO2 24 24  GLUCOSE 100* 130*  BUN 7 10  CREATININE 0.76 0.64  CALCIUM 8.4* 7.9*   PT/INR No results for input(s): LABPROT, INR in the last 72 hours. CMP     Component Value Date/Time   NA 132 (L) 02/07/2017 0655   K 3.6 02/07/2017 0655   CL 99 (L) 02/07/2017 0655   CO2 24 02/07/2017 0655   GLUCOSE 130 (H) 02/07/2017 0655   BUN 10 02/07/2017 0655   CREATININE 0.64 02/07/2017 0655   CALCIUM 7.9 (L) 02/07/2017 0655   PROT 6.1 (L) 02/07/2017 0655   ALBUMIN 3.1 (L) 02/07/2017 0655   AST 448 (H) 02/07/2017 0655   ALT 754 (H)  02/07/2017 0655   ALKPHOS 88 02/07/2017 0655   BILITOT 2.9 (H) 02/07/2017 0655   GFRNONAA >60 02/07/2017 0655   GFRAA >60 02/07/2017 0655   Lipase     Component Value Date/Time   LIPASE 40 02/03/2017 1340       Studies/Results: Dg Abd 1 View  Result Date: 02/07/2017 CLINICAL DATA:  Abdominal distension.  Nausea and vomiting. EXAM: ABDOMEN - 1 VIEW COMPARISON:  Endoscopic procedural images.  MRI 02/04/2017. FINDINGS: Clips in the right upper quadrant consistent with previous cholecystectomy. Question small densities just medial to the clips which could represent residual ductal contrast or ductal stones. Other previously administered contrast has passed into the intestine, having reached the colon. No evidence of small bowel obstruction. IMPRESSION: Unremarkable bowel gas pattern. Previously administered contrast has reached the colon. Right upper quadrant clips consistent with previous cholecystectomy. Small densities medial to the clips could be ductal stones or some retained contrast. Electronically Signed   By: Paulina FusiMark  Shogry M.D.   On: 02/07/2017 07:38   Dg Cholangiogram Operative  Result Date: 02/05/2017 CLINICAL DATA:  Intraoperative cholangiogram during laparoscopic cholecystectomy. EXAM: INTRAOPERATIVE CHOLANGIOGRAM FLUOROSCOPY TIME:  14 seconds COMPARISON:  MRCP - 02/04/2017 FINDINGS: Intraoperative cholangiographic images of the right upper abdominal quadrant during laparoscopic cholecystectomy are provided for review. Surgical clips overlie the expected location of  the gallbladder fossa. Contrast injection demonstrates selective cannulation of the central aspect of the cystic duct. There is passage of contrast through the central aspect of the cystic duct with filling of a non dilated common bile duct. 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 There is passage of contrast though the CBD and into the descending portion of the duodenum. There is minimal reflux of  injected contrast into the common hepatic duct and central aspect of the non dilated intrahepatic biliary system. There is minimal opacification of the pancreatic duct which appears nondilated. There is a persistent nonocclusive filling defect within the distal aspect of the CBD, caudal to the insertion of the cystic duct worrisome for choledocholithiasis. IMPRESSION: Findings worrisome for choledocholithiasis. Correlation with the operative report is recommended. Electronically Signed   By: Simonne Come M.D.   On: 02/05/2017 11:12   Dg Ercp With Sphincterotomy  Result Date: 02/06/2017 CLINICAL DATA:  26 year old female with a history of choledocholithiasis EXAM: ERCP TECHNIQUE: Multiple spot images obtained with the fluoroscopic device and submitted for interpretation post-procedure. FLUOROSCOPY TIME:  Fluoroscopy Time:  0 minutes 14 seconds COMPARISON:  Intraoperative cholangiogram 02/05/2017 FINDINGS: Limited images during ERCP. Initial image demonstrates endoscope projecting over the upper abdomen with cannulation of the ampulla and retrograde infusion of contrast around a safety wire. Subsequently there is deployment of a retrieval balloon, with incomplete opacification of the extrahepatic biliary system. Surgical changes of cholecystectomy. IMPRESSION: Limited images during ERCP demonstrates partial opacification of the extrahepatic biliary ducts, with deployment of retrieval balloon. These images were submitted for radiologic interpretation only. Please see the procedural report for the amount of contrast and the fluoroscopy time utilized. Electronically Signed   By: Gilmer Mor D.O.   On: 02/06/2017 15:48    Anti-infectives: Anti-infectives    Start     Dose/Rate Route Frequency Ordered Stop   02/05/17 0830  ceFAZolin (ANCEF) IVPB 2g/100 mL premix     2 g 200 mL/hr over 30 Minutes Intravenous On call to O.R. 02/05/17 6213 02/05/17 0959   02/03/17 2200  metroNIDAZOLE (FLAGYL) IVPB 500 mg   Status:  Discontinued     500 mg 100 mL/hr over 60 Minutes Intravenous Every 8 hours 02/03/17 2127 02/07/17 0603   02/03/17 2200  cefTRIAXone (ROCEPHIN) 2 g in dextrose 5 % 50 mL IVPB  Status:  Discontinued     2 g 100 mL/hr over 30 Minutes Intravenous Every 24 hours 02/03/17 2127 02/07/17 0603       Assessment/Plan Calculus of bile duct with acute cholecystitis without mention of obstruction S/p lap chole 9/10 Dr. Luisa Hart - POD 2 - IOC positive for Choledocholithiasis - s/p ERCP 9/11 - postop n/v - XR normal - WBC Up 18.7, afebrile - bilirubin trending down 2.9  ID - rocephin/flagyl 9/8>>9/12 FEN - IVF, clears VTE - SCDs, lovenox Foley - none Follow up - 2-3 weeks [facility]  Plan - Postop n/v. Checking lipase for pancreatitis. Increase IVF. Add reglan PRN. Add toradol for better pain control. Repeat CBC/CMP in AM. Encouraged patient to ambulate.   LOS: 4 days    Franne Forts , [facility] Surgery 02/07/2017, 8:27 AM Pager: [phone] Consults: [phone] Mon-Fri 7:00 am-4:30 pm Sat-Sun 7:00 am-11:30 am

## 2017-02-08 ENCOUNTER — Encounter (HOSPITAL_COMMUNITY): Payer: Self-pay | Admitting: Gastroenterology

## 2017-02-08 DIAGNOSIS — R1084 Generalized abdominal pain: Secondary | ICD-10-CM

## 2017-02-08 DIAGNOSIS — R7989 Other specified abnormal findings of blood chemistry: Secondary | ICD-10-CM

## 2017-02-08 DIAGNOSIS — K859 Acute pancreatitis without necrosis or infection, unspecified: Secondary | ICD-10-CM | POA: Diagnosis not present

## 2017-02-08 DIAGNOSIS — R112 Nausea with vomiting, unspecified: Secondary | ICD-10-CM

## 2017-02-08 LAB — COMPREHENSIVE METABOLIC PANEL
ALBUMIN: 2.7 g/dL — AB (ref 3.5–5.0)
ALT: 469 U/L — ABNORMAL HIGH (ref 14–54)
ANION GAP: 10 (ref 5–15)
AST: 127 U/L — AB (ref 15–41)
Alkaline Phosphatase: 71 U/L (ref 38–126)
BILIRUBIN TOTAL: 2.6 mg/dL — AB (ref 0.3–1.2)
BUN: 9 mg/dL (ref 6–20)
CHLORIDE: 100 mmol/L — AB (ref 101–111)
CO2: 25 mmol/L (ref 22–32)
Calcium: 7.4 mg/dL — ABNORMAL LOW (ref 8.9–10.3)
Creatinine, Ser: 0.62 mg/dL (ref 0.44–1.00)
GFR calc Af Amer: >60 mL/min (ref >60)
GFR calc non Af Amer: >60 mL/min (ref >60)
GLUCOSE: 112 mg/dL — AB (ref 65–99)
POTASSIUM: 3.6 mmol/L (ref 3.5–5.1)
SODIUM: 135 mmol/L (ref 135–145)
TOTAL PROTEIN: 5.3 g/dL — AB (ref 6.5–8.1)

## 2017-02-08 LAB — CBC
HEMATOCRIT: 38 % (ref 36.0–46.0)
HEMOGLOBIN: 12.7 g/dL (ref 12.0–15.0)
MCH: 29.6 pg (ref 26.0–34.0)
MCHC: 33.4 g/dL (ref 30.0–36.0)
MCV: 88.6 fL (ref 78.0–100.0)
Platelets: 328 10*3/uL (ref 150–400)
RBC: 4.29 MIL/uL (ref 3.87–5.11)
RDW: 13.1 % (ref 11.5–15.5)
WBC: 24.3 10*3/uL — ABNORMAL HIGH (ref 4.0–10.5)

## 2017-02-08 LAB — LIPASE, BLOOD: Lipase: 912 U/L — ABNORMAL HIGH (ref 11–51)

## 2017-02-08 MED ORDER — ACETAMINOPHEN 500 MG PO TABS
500.0000 mg | ORAL_TABLET | Freq: Four times a day (QID) | ORAL | Status: DC | PRN
Start: 2017-02-08 — End: 2017-02-19
  Administered 2017-02-08 – 2017-02-14 (×9): 500 mg via ORAL
  Filled 2017-02-08 (×8): qty 1

## 2017-02-08 MED ORDER — POLYETHYLENE GLYCOL 3350 17 G PO PACK
17.0000 g | PACK | Freq: Once | ORAL | Status: AC
  Administered 2017-02-09: 17 g via ORAL
  Filled 2017-02-08: qty 1

## 2017-02-08 MED ORDER — POLYETHYLENE GLYCOL 3350 17 G PO PACK
17.0000 g | PACK | Freq: Every day | ORAL | Status: DC | PRN
  Administered 2017-02-08: 17 g via ORAL
  Filled 2017-02-08: qty 1

## 2017-02-08 MED ORDER — ACETAMINOPHEN 500 MG PO TABS
1000.0000 mg | ORAL_TABLET | Freq: Once | ORAL | Status: AC
  Administered 2017-02-08: 1000 mg via ORAL
  Filled 2017-02-08: qty 2

## 2017-02-08 MED ORDER — METHOCARBAMOL 1000 MG/10ML IJ SOLN
500.0000 mg | Freq: Three times a day (TID) | INTRAVENOUS | Status: DC | PRN
  Administered 2017-02-08 – 2017-02-13 (×3): 500 mg via INTRAVENOUS
  Filled 2017-02-08 (×4): qty 5

## 2017-02-08 MED ORDER — SENNOSIDES-DOCUSATE SODIUM 8.6-50 MG PO TABS: 1.0000 | ORAL_TABLET | Freq: Every day | ORAL | Status: DC | PRN

## 2017-02-08 NOTE — Progress Notes (Signed)
   Subjective: Pt developed post-ERCP pancreatitis. She notes one episode of emesis yesterday evening, none since. Her abdominal pain is similar in severity. She did not tolerate a dilaudid dose due to reported flushing, strange sensation in her chest. She states toradol helps the pain and allows her to rest, was hesitant for other opiate pain control options. Has not had a BM, passing small amount of flatus. She is tolerating sips of water.   Objective:  Vital signs in last 24 hours: Vitals:   02/07/17 0447 02/07/17 1510 02/07/17 2211 02/08/17 0612  BP: 129/71 133/76 127/70 129/78  Pulse: 81 90 95 87  Resp: 17 17  16   Temp: 98.6 F (37 C) 98.3 F (36.8 C) 98.6 F (37 C) 98.9 F (37.2 C)  TempSrc: Oral Oral Oral Oral  SpO2: 98% 99% 96% 98%  Weight:      Height:       Physical Exam  Constitutional: She is oriented to person, place, and time.  Sitting in chair, no acute distress   HENT:  Head: Normocephalic and atraumatic.  Eyes: No scleral icterus.  Cardiovascular: Normal rate, regular rhythm and normal heart sounds.   Pulmonary/Chest: Effort normal and breath sounds normal. No respiratory distress.  Abdominal:  Continued distension, tenderness to palpation to all quadrants but interval improvement, +BS-hypoactive     Neurological: She is alert and oriented to person, place, and time.  Skin: Capillary refill takes less than 2 seconds.    Assessment/Plan:  Choledocholithiasis, cholecystitis  Pt presented with acute worsening of abd pain, known cholelithiasis. Found to have T. bili 5.5, AlkP 128, AST 320, ALT 529, consistent with choledocholithiasis. She is s/p laparoscopic cholecystectomy, subsequent ERCP was performed with stone removal and sphincterotomy. Had recurrent abdominal pain with n/v post-procedure, lipase elevated >1500 consistent with post-ERCP pancreatitis.    --Continue IVF  --CMP, CBC --NPO, advance diet as tolerated  --IV Zofran 4 mg q6hr prn, IV phenergan  12.5 mg q6hrs prn, IV Reglan 5 mg q6hrs prn   --IV Toradol 15 mg q6hrs --Senna-docusate, miralax prn  --Encourage ambulation   Transaminitis Elevations on presentation likely related to choledocholithiasis as above. Pt also has a history of transient transaminitis attributed to medications (minocycline, suliden for acne) with unremarkable work up (incl  ferritin, alpha-1 antitrypsin, HBsAg, HCV ab, AMA, ASMA)  and resolution after medication discontinuation. Down-trending   --Monitor as above     Dispo: Anticipated discharge in approximately 2-3 day(s).   Ginger CarneHarden, Chadrick Sprinkle, MD 02/08/2017, 11:44 AM Pager: [phone]

## 2017-02-08 NOTE — Progress Notes (Signed)
Patient ID: Sierra WeikJanaisa Hernandez   DOB: 1991/05/25, 26 y.o.   MRN: [id number]  [facility] Surgery Progress Note  2 Days Post-Op  Subjective: CC- choledocholithiasis, pancreatitis Sitting up in bed, mother at bedside. She reports little improvement in abdominal pain. No n/v in 18 hours. No flatus or BM.   Objective: Vital signs in last 24 hours: Temp:  [98.3 F (36.8 C)-98.9 F (37.2 C)] 98.9 F (37.2 C) (09/13 0612) Pulse Rate:  [87-95] 87 (09/13 0612) Resp:  [16-17] 16 (09/13 0612) BP: (127-133)/(70-78) 129/78 (09/13 0612) SpO2:  [96 %-99 %] 98 % (09/13 0612) Last BM Date: 02/03/17  Intake/Output from previous day: 09/12 0701 - 09/13 0700 In: 180 [I.V.:180] Out: 100 [Urine:100] Intake/Output this shift: Total I/O In: -  Out: 100 [Urine:100]  PE: Gen:  Alert, NAD, pleasant HEENT: EOM's intact, pupils equal and round Pulm:  CTAB, effort normal Cardio: RRR Abd: Soft, distended, hypoactive BS, incisions C/D/I, mild global tenderness Skin: no rashes noted, warm and dry  Lab Results:   Recent Labs  02/07/17 0655 02/08/17 0404  WBC 18.7* 24.3*  HGB 13.7 12.7  HCT 41.4 38.0  PLT 340 328   BMET  Recent Labs  02/07/17 0655 02/08/17 0404  NA 132* 135  K 3.6 3.6  CL 99* 100*  CO2 24 25  GLUCOSE 130* 112*  BUN 10 9  CREATININE 0.64 0.62  CALCIUM 7.9* 7.4*   PT/INR No results for input(s): LABPROT, INR in the last 72 hours. CMP     Component Value Date/Time   NA 135 02/08/2017 0404   K 3.6 02/08/2017 0404   CL 100 (L) 02/08/2017 0404   CO2 25 02/08/2017 0404   GLUCOSE 112 (H) 02/08/2017 0404   BUN 9 02/08/2017 0404   CREATININE 0.62 02/08/2017 0404   CALCIUM 7.4 (L) 02/08/2017 0404   PROT 5.3 (L) 02/08/2017 0404   ALBUMIN 2.7 (L) 02/08/2017 0404   AST 127 (H) 02/08/2017 0404   ALT 469 (H) 02/08/2017 0404   ALKPHOS 71 02/08/2017 0404   BILITOT 2.6 (H) 02/08/2017 0404   GFRNONAA >60 02/08/2017 0404   GFRAA >60 02/08/2017 0404   Lipase      Component Value Date/Time   LIPASE 1,650 (H) 02/07/2017 0655       Studies/Results: Dg Abd 1 View  Result Date: 02/07/2017 CLINICAL DATA:  Abdominal distension.  Nausea and vomiting. EXAM: ABDOMEN - 1 VIEW COMPARISON:  Endoscopic procedural images.  MRI 02/04/2017. FINDINGS: Clips in the right upper quadrant consistent with previous cholecystectomy. Question small densities just medial to the clips which could represent residual ductal contrast or ductal stones. Other previously administered contrast has passed into the intestine, having reached the colon. No evidence of small bowel obstruction. IMPRESSION: Unremarkable bowel gas pattern. Previously administered contrast has reached the colon. Right upper quadrant clips consistent with previous cholecystectomy. Small densities medial to the clips could be ductal stones or some retained contrast. Electronically Signed   By: Paulina Fusi M.D.   On: 02/07/2017 07:38   Dg Ercp With Sphincterotomy  Result Date: 02/06/2017 CLINICAL DATA:  26 year old female with a history of choledocholithiasis EXAM: ERCP TECHNIQUE: Multiple spot images obtained with the fluoroscopic device and submitted for interpretation post-procedure. FLUOROSCOPY TIME:  Fluoroscopy Time:  0 minutes 14 seconds COMPARISON:  Intraoperative cholangiogram 02/05/2017 FINDINGS: Limited images during ERCP. Initial image demonstrates endoscope projecting over the upper abdomen with cannulation of the ampulla and retrograde infusion of contrast around a safety wire. Subsequently  there is deployment of a retrieval balloon, with incomplete opacification of the extrahepatic biliary system. Surgical changes of cholecystectomy. IMPRESSION: Limited images during ERCP demonstrates partial opacification of the extrahepatic biliary ducts, with deployment of retrieval balloon. These images were submitted for radiologic interpretation only. Please see the procedural report for the amount of contrast and  the fluoroscopy time utilized. Electronically Signed   By: Gilmer MorJaime  Wagner D.O.   On: 02/06/2017 15:48    Anti-infectives: Anti-infectives    Start     Dose/Rate Route Frequency Ordered Stop   02/05/17 0830  ceFAZolin (ANCEF) IVPB 2g/100 mL premix     2 g 200 mL/hr over 30 Minutes Intravenous On call to O.R. 02/05/17 [id number] 02/05/17 0959   02/03/17 2200  metroNIDAZOLE (FLAGYL) IVPB 500 mg  Status:  Discontinued     500 mg 100 mL/hr over 60 Minutes Intravenous Every 8 hours 02/03/17 2127 02/07/17 0603   02/03/17 2200  cefTRIAXone (ROCEPHIN) 2 g in dextrose 5 % 50 mL IVPB  Status:  Discontinued     2 g 100 mL/hr over 30 Minutes Intravenous Every 24 hours 02/03/17 2127 02/07/17 0603       Assessment/Plan IBS - takes miralax almost daily at home  Calculus of bile duct with acute cholecystitis without mention of obstruction S/p lap chole 9/10 Dr. Luisa Hartornett - POD 3 - s/p ERCP 9/11 - Post-ERCP pancreatitis - continue IVF, NPO. Creatinine WNL. Strict I&O's - WBC Up 24.3, afebrile - LFTs trending down, bilirubin 2.6  ID - rocephin/flagyl 9/8>>9/12 FEN - IVF, clears VTE - SCDs, lovenox Foley - none Follow up - 2-3 weeks [facility] clinic  Plan - Lipase pending. Continue IVF, NPO. No n/v in about 18 hours. Daily suppository PRN. Add robaxin PRN for pain. Repeat labs in AM. Continue to mobilize as able.   LOS: 5 days    Franne FortsBROOKE A MEUTH , [facility] 02/08/2017, 8:30 AM Pager: [phone] Consults: [phone] Mon-Fri 7:00 am-4:30 pm Sat-Sun 7:00 am-11:30 am

## 2017-02-08 NOTE — Progress Notes (Addendum)
Daily Rounding Note  02/08/2017, 9:07 AM  LOS: 5 days   SUBJECTIVE:   Chief complaint: abdominal pain, worst of this bil in lower abdomen with sensation of pressure, lesser bil upper abd pain persists.  Also having right sided subscapular pain.  Toradol helps for a while, does not like Dilaudid as it causes burning sensation in her chest. No further N/V since last evening.      Urinating well.  I/Os + 1.5 liters yesterday.    OBJECTIVE:         Vital signs in last 24 hours:    Temp:  [98.3 F (36.8 C)-98.9 F (37.2 C)] 98.9 F (37.2 C) (09/13 0612) Pulse Rate:  [87-95] 87 (09/13 0612) Resp:  [16-17] 16 (09/13 0612) BP: (127-133)/(70-78) 129/78 (09/13 0612) SpO2:  [96 %-99 %] 98 % (09/13 0612) Last BM Date: 02/03/17 Filed Weights   02/04/17 0600 02/06/17 1217  Weight: 59 kg (130 lb 1.6 oz) 59 kg (130 lb)   General: looks better than yest afternoon, no diaphoresis   Heart: RRR Chest: clear bil.  No dyspnea Abdomen: soft, ND.  BS still quiet.  Tender diffusely,  > in lower abdomen bil.    Extremities: no CCE Neuro/Psych:  Alert, oriented x 3.  No deficits.    Intake/Output from previous day: 09/12 0701 - 09/13 0700 In: 180 [I.V.:180] Out: 100 [Urine:100]  Intake/Output this shift: Total I/O In: -  Out: 100 [Urine:100]  Lab Results:  Recent Labs  02/06/17 0654 02/07/17 0655 02/08/17 0404  WBC 14.0* 18.7* 24.3*  HGB 11.2* 13.7 12.7  HCT 34.0* 41.4 38.0  PLT 267 340 328   BMET  Recent Labs  02/06/17 0654 02/07/17 0655 02/08/17 0404  NA 135 132* 135  K 3.5 3.6 3.6  CL 101 99* 100*  CO2 24 24 25   GLUCOSE 100* 130* 112*  BUN 7 10 9   CREATININE 0.76 0.64 0.62  CALCIUM 8.4* 7.9* 7.4*   LFT  Recent Labs  02/06/17 0654 02/07/17 0655 02/08/17 0404  PROT 5.7* 6.1* 5.3*  ALBUMIN 2.9* 3.1* 2.7*  AST 220* 448* 127*  ALT 529* 754* 469*  ALKPHOS 94 88 71  BILITOT 3.5* 2.9* 2.6*   PT/INR No  results for input(s): LABPROT, INR in the last 72 hours. Hepatitis Panel No results for input(s): HEPBSAG, HCVAB, HEPAIGM, HEPBIGM in the last 72 hours.  Studies/Results: Dg Abd 1 View  Result Date: 02/07/2017 CLINICAL DATA:  Abdominal distension.  Nausea and vomiting. EXAM: ABDOMEN - 1 VIEW COMPARISON:  Endoscopic procedural images.  MRI 02/04/2017. FINDINGS: Clips in the right upper quadrant consistent with previous cholecystectomy. Question small densities just medial to the clips which could represent residual ductal contrast or ductal stones. Other previously administered contrast has passed into the intestine, having reached the colon. No evidence of small bowel obstruction. IMPRESSION: Unremarkable bowel gas pattern. Previously administered contrast has reached the colon. Right upper quadrant clips consistent with previous cholecystectomy. Small densities medial to the clips could be ductal stones or some retained contrast. Electronically Signed   By: Paulina FusiMark  Shogry M.D.   On: 02/07/2017 07:38   Dg Ercp With Sphincterotomy  Result Date: 02/06/2017 CLINICAL DATA:  26 year old female with a history of choledocholithiasis EXAM: ERCP TECHNIQUE: Multiple spot images obtained with the fluoroscopic device and submitted for interpretation post-procedure. FLUOROSCOPY TIME:  Fluoroscopy Time:  0 minutes 14 seconds COMPARISON:  Intraoperative cholangiogram 02/05/2017 FINDINGS: Limited images during ERCP. Initial  image demonstrates endoscope projecting over the upper abdomen with cannulation of the ampulla and retrograde infusion of contrast around a safety wire. Subsequently there is deployment of a retrieval balloon, with incomplete opacification of the extrahepatic biliary system. Surgical changes of cholecystectomy. IMPRESSION: Limited images during ERCP demonstrates partial opacification of the extrahepatic biliary ducts, with deployment of retrieval balloon. These images were submitted for radiologic  interpretation only. Please see the procedural report for the amount of contrast and the fluoroscopy time utilized. Electronically Signed   By: Gilmer Mor D.O.   On: 02/06/2017 15:48   Scheduled Meds: . enoxaparin (LOVENOX) injection  40 mg Subcutaneous Q24H  . ketorolac  15 mg Intravenous Q6H   Continuous Infusions: . lactated ringers 200 mL/hr at 02/08/17 0816  . methocarbamol (ROBAXIN)  IV     PRN Meds:.acetaminophen, [COMPLETED] bisacodyl **FOLLOWED BY** bisacodyl, HYDROmorphone (DILAUDID) injection, methocarbamol (ROBAXIN)  IV, metoCLOPramide (REGLAN) injection, ondansetron (ZOFRAN) IV, polyethylene glycol, promethazine, senna-docusate   ASSESMENT:   *  Post ERCP pancreatitis.  WBCs rising 18.7 >> 24.3.  On aggressive IVF of LR at 200/hour.  Repeat Lipase pending.      *  Choledocholithiasis.  02/06/17 ERCP with sphinct, stone extraction.  No remaining stones at final cholangiogram.  Indocin PR given post ERCP.  LFTs all improved   *  Cholelithiasis, cholecystitis.  02/05/17 lap chole, CBD stones on IOC.     PLAN   *  AM labs: CMET, CBC, Lipase.  Had negative U/A on 9/8, so not going to repeat.  Strict I/Os.    *   Surgical PA added Robaxin.  If pain persists at current levels through tomorrow, and or WBCs rising further, consider CT imaging.      Jennye Moccasin  02/08/2017, 9:07 AM Pager: [phone]  I have discussed the case with the PA, and that is the plan I formulated. I personally interviewed and examined the patient.  I spent a long time with Anija and her family today. They have many questions about her persistent symptoms and expected course. She is concerned about fluid retention in the legs and that is making her ankles feel "tight". She does not feel she is urinating as much she should, her urine is still dark, which I have observed in the toilet collection device. She still has generalized abdominal pain, nausea, intermittent belching, and does not think she has  passed flatus or stool today. She has chronic IBS-C and usually takes MiraLAX at home.  I discussed the typical course of pancreatitis, how it may take a few days to peak and finally improve. Her heart rate is normal, she does not currently have an ileus, renal function is normal, she is nontoxic and I think will start improving in the next day or 2. Fortunately, her LFTs are also improving.  Despite the peripheral edema, which I have reassured them about, she continues to need large volume IV fluids due to the pancreatitis and intravascular depletion that typically causes. I would keep her current rate of LR at 200 mL an hour at least until tomorrow.  I will have her one dose of MiraLAX to see if that helps relieve constipation.  We will of course see her again tomorrow and every day until she is ready for discharge.  Over half of the 30 minute encounter was spent in counseling and coordination of care.     Charlie Pitter III Pager [phone]  Mon-Fri 8a-5p [phone] after 5p, weekends, holidays

## 2017-02-08 NOTE — Progress Notes (Signed)
Patient c/o pain in abdomen and states she does not want to take dilaudid because she does not like the way it makes her feel.  It is not time for Toradol to be given.  RN notified [facility] and spoke with Olegario MessierKathy in the answering service.  RN made Olegario MessierKathy aware of patient's need and Olegario MessierKathy is Web designerpaging surgeon on call.  P.J. Henderson NewcomerSexton, RN

## 2017-02-08 NOTE — Progress Notes (Signed)
Call back from staff in OR with Dr. Cliffton AstersWhite, RN explained patient having pain and not wanting to take Hydromorphone, wondering if there is another pain medication she can have.  Order for Tylenol 1 gram PO received.  P.J. Henderson NewcomerSexton, RN

## 2017-02-09 LAB — URINALYSIS, ROUTINE W REFLEX MICROSCOPIC
Bacteria, UA: NONE SEEN
Bilirubin Urine: NEGATIVE
GLUCOSE, UA: NEGATIVE mg/dL
Ketones, ur: 5 mg/dL — AB
Leukocytes, UA: NEGATIVE
Nitrite: NEGATIVE
Protein, ur: NEGATIVE mg/dL
SPECIFIC GRAVITY, URINE: 1.006 (ref 1.005–1.030)
Squamous Epithelial / LPF: NONE SEEN
WBC, UA: NONE SEEN WBC/hpf (ref 0–5)
pH: 6 (ref 5.0–8.0)

## 2017-02-09 LAB — LIPASE, BLOOD: LIPASE: 155 U/L — AB (ref 11–51)

## 2017-02-09 LAB — COMPREHENSIVE METABOLIC PANEL
ALT: 217 U/L — ABNORMAL HIGH (ref 14–54)
ANION GAP: 8 (ref 5–15)
AST: 35 U/L (ref 15–41)
Albumin: 2.3 g/dL — ABNORMAL LOW (ref 3.5–5.0)
Alkaline Phosphatase: 55 U/L (ref 38–126)
BILIRUBIN TOTAL: 2 mg/dL — AB (ref 0.3–1.2)
BUN: 9 mg/dL (ref 6–20)
CALCIUM: 7.4 mg/dL — AB (ref 8.9–10.3)
CHLORIDE: 99 mmol/L — AB (ref 101–111)
CO2: 25 mmol/L (ref 22–32)
CREATININE: 0.74 mg/dL (ref 0.44–1.00)
GFR calc Af Amer: >60 mL/min (ref >60)
Glucose, Bld: 101 mg/dL — ABNORMAL HIGH (ref 65–99)
POTASSIUM: 4.2 mmol/L (ref 3.5–5.1)
Sodium: 132 mmol/L — ABNORMAL LOW (ref 135–145)
Total Protein: 4.7 g/dL — ABNORMAL LOW (ref 6.5–8.1)

## 2017-02-09 LAB — CBC
HEMATOCRIT: 35.4 % — AB (ref 36.0–46.0)
Hemoglobin: 11.8 g/dL — ABNORMAL LOW (ref 12.0–15.0)
MCH: 29.9 pg (ref 26.0–34.0)
MCHC: 33.3 g/dL (ref 30.0–36.0)
MCV: 89.6 fL (ref 78.0–100.0)
Platelets: 261 10*3/uL (ref 150–400)
RBC: 3.95 MIL/uL (ref 3.87–5.11)
RDW: 13.1 % (ref 11.5–15.5)
WBC: 25 10*3/uL — ABNORMAL HIGH (ref 4.0–10.5)

## 2017-02-09 MED ORDER — SIMETHICONE 80 MG PO CHEW
80.0000 mg | CHEWABLE_TABLET | Freq: Four times a day (QID) | ORAL | Status: DC | PRN
  Administered 2017-02-09: 80 mg via ORAL
  Filled 2017-02-09: qty 1

## 2017-02-09 MED ORDER — MAGNESIUM HYDROXIDE 400 MG/5ML PO SUSP
960.0000 mL | Freq: Once | ORAL | Status: AC
  Administered 2017-02-09: 960 mL via RECTAL
  Filled 2017-02-09: qty 240

## 2017-02-09 MED ORDER — FENTANYL CITRATE (PF) 100 MCG/2ML IJ SOLN
12.5000 ug | Freq: Once | INTRAMUSCULAR | Status: AC
Start: 2017-02-09 — End: 2017-02-09
  Administered 2017-02-09: 12.5 ug via INTRAVENOUS
  Filled 2017-02-09: qty 2

## 2017-02-09 MED ORDER — FUROSEMIDE 10 MG/ML IJ SOLN
20.0000 mg | Freq: Once | INTRAMUSCULAR | Status: AC
  Administered 2017-02-09: 20 mg via INTRAVENOUS
  Filled 2017-02-09: qty 2

## 2017-02-09 MED ORDER — POLYETHYLENE GLYCOL 3350 17 G PO PACK
17.0000 g | PACK | Freq: Every day | ORAL | Status: DC
  Administered 2017-02-09 – 2017-02-19 (×8): 17 g via ORAL
  Filled 2017-02-09 (×10): qty 1

## 2017-02-09 MED ORDER — FENTANYL CITRATE (PF) 100 MCG/2ML IJ SOLN
12.5000 ug | INTRAMUSCULAR | Status: DC | PRN
Start: 2017-02-09 — End: 2017-02-19
  Administered 2017-02-09: 12.5 ug via INTRAVENOUS
  Filled 2017-02-09: qty 2

## 2017-02-09 MED ORDER — TRAMADOL HCL 50 MG PO TABS
50.0000 mg | ORAL_TABLET | Freq: Four times a day (QID) | ORAL | Status: DC | PRN
Start: 2017-02-09 — End: 2017-02-19
  Administered 2017-02-09 – 2017-02-13 (×4): 50 mg via ORAL
  Filled 2017-02-09 (×4): qty 1

## 2017-02-09 NOTE — Progress Notes (Signed)
Subjective: Overnight pt had increased edema with generalized swelling, labial swelling. Her fluid rate was decreased to 125 ml/hr. She has had continued abdominal pain in various areas of the abdominal as well as R sided groin discomfort. She has been avoiding opiate pain medications due to side effects with dilaudid, tried tramadol with no relief and was willing to try fentanyl this morning. Reports she is still not passing flatus, no BMs. Also feels she has had decreased urinary frequency compared to prior.   Pt and family are frustrated with her course as they feel she has worsened since her initial operation and pancreatitis diagnosis, as well as with some aspects of her care. Multiple providers have explained provided expectant management for pancreatitis, post-operative complications. We also explained the need for fluids in her case and understand the discomfort of edema. Also explained that the risk of a CT scan with contrast likely outweighs the benefit as it would be early to expect complications and would not significantly change management.  Objective:  Vital signs in last 24 hours: Vitals:   02/08/17 1434 02/08/17 2116 02/08/17 2241 02/09/17 0503  BP: 129/70 117/70  133/78  Pulse: 96 97  (!) 105  Resp: Temp: 99 F (37.2 C) (!) 100.4 F (38 C) 98.9 F (37.2 C) 99.5 F (37.5 C)  TempSrc: Oral Oral Oral Oral  SpO2: 98% 97%  100%  Weight:      Height:       Physical Exam  Constitutional: She is oriented to person, place, and time.  Sitting in chair, no acute distress   HENT:  Head: Normocephalic and atraumatic.  Eyes: No scleral icterus.  Cardiovascular: Normal rate, regular rhythm and normal heart sounds.   Pulmonary/Chest: Effort normal and breath sounds normal. No respiratory distress.  Abdominal:  Continued distension, tenderness to light palpation to all quadrants and hypoactive bowel sounds     Neurological: She is alert and oriented to person, place,  and time.  Skin: Capillary refill takes less than 2 seconds.    Assessment/Plan:  Choledocholithiasis, cholecystitis, Post-ERCP Pancreatitis, Post-operative Ileus   Pt presented with acute worsening of abd pain, known cholelithiasis. Found to have T. bili 5.5, AlkP 128, AST 320, ALT 529, consistent with choledocholithiasis. She is s/p laparoscopic cholecystectomy, subsequent ERCP was performed with stone removal and sphincterotomy. Had recurrent abdominal pain with n/v post-procedure, lipase elevated >1500 consistent with post-ERCP pancreatitis, as well as ileus related to procedures. She has been managed with IVF, NPO, IV analgesia and anti-emetics though is not progressing and pt feels is worse. Objective lab data are improving. Discussed case with GI who feel CT scan is likely premature, her course is within expectations, and also introduces risk of contrast related AKI in a pt who is likely intravascularly depleted and receiving toradol. Will hold off on imaging, continue supportive care.     --Continue IVF at 125 ml/hr --IV Lasix 20 mg once, assess output and edema sx   --CMP, CBC --NPO, advance diet as tolerated  --IV Zofran 4 mg q6hr prn, IV phenergan 12.5 mg q6hrs prn, IV Reglan 5 mg q6hrs prn   --IV Toradol 15 mg q6hrs, IV Fentanyl 12.5 mg and assess response  --Senna-docusate, miralax prn. Enema today   --Encourage ambulation    Transaminitis Elevations on presentation likely related to choledocholithiasis as above. Pt also has a history of transient transaminitis attributed to medications (minocycline, suliden for acne) with unremarkable work up (incl  ferritin, alpha-1  antitrypsin, HBsAg, HCV ab, AMA, ASMA)  and resolution after medication discontinuation. Continuing to trend down   --Monitor as above     Dispo: Anticipated discharge in approximately 2-3 day(s).   Ginger Carne, MD 02/09/2017, 7:03 AM Pager: [phone]

## 2017-02-09 NOTE — Progress Notes (Signed)
Daily Rounding Note  02/09/2017, 8:27 AM  LOS: 6 days   SUBJECTIVE:   Chief complaint: generalized abdominal pain persists, greatest in lower abdomen.  Swelling in lower belly and into labia.  Passing scant flatus.  Feels like she needs to defecate and pass more gas, but it is not happening.  miralax not of any benefit.       Overnight, resident, MD decreased IVF to 125/hour due to swelling I/O is not correct, says she got 7.6 liters of IVF when 200/hour x 24 hours is only 2.8 liters.  The total for the days was 6.7.  But doing the math 2.8 liters minus 860 output (urine) = 1940 liters positive.   Urine output is lower than normal.     OBJECTIVE:         Vital signs in last 24 hours:    Temp:  [98.9 F (37.2 C)-100.4 F (38 C)] 99.5 F (37.5 C) (09/14 0503) Pulse Rate:  [96-105] 105 (09/14 0503) Resp:  [16-18] 18 (09/14 0503) BP: (117-133)/(70-78) 133/78 (09/14 0503) SpO2:  [97 %-100 %] 100 % (09/14 0503) Last BM Date: 02/13/17 Filed Weights   02/04/17 0600 02/06/17 1217  Weight: 59 kg (130 lb 1.6 oz) 59 kg (130 lb)   General: uncomfortable, stressed, fully alert.      Heart: RRR Chest: clear bil.   Abdomen: more tender, diffusely.  BS absent.  Soft, though a bit more firm today.    Extremities: Non-pitting edema in labia, legs, feet.   Neuro/Psych:  Alert, anxious.  No deficits.    Intake/Output from previous day: 09/13 0701 - 09/14 0700 In: 7600.4 [I.V.:7545.4; IV Piggyback:55] Out: 860 [Urine:860]  Intake/Output this shift: No intake/output data recorded.  Lab Results:  Recent Labs  02/07/17 0655 02/08/17 0404 02/09/17 0404  WBC 18.7* 24.3* 25.0*  HGB 13.7 12.7 11.8*  HCT 41.4 38.0 35.4*  PLT 340 328 261   BMET  Recent Labs  02/07/17 0655 02/08/17 0404 02/09/17 0404  NA 132* 135 132*  K 3.6 3.6 4.2  CL 99* 100* 99*  CO2 GLUCOSE 130* 112* 101*  BUN CREATININE 0.64 0.62  0.74  CALCIUM 7.9* 7.4* 7.4*   LFT  Recent Labs  02/07/17 0655 02/08/17 0404 02/09/17 0404  PROT 6.1* 5.3* 4.7*  ALBUMIN 3.1* 2.7* 2.3*  AST 448* 127* 35  ALT 754* 469* 217*  ALKPHOS 88 71 55  BILITOT 2.9* 2.6* 2.0*   PT/INR No results for input(s): LABPROT, INR in the last 72 hours. Hepatitis Panel No results for input(s): HEPBSAG, HCVAB, HEPAIGM, HEPBIGM in the last 72 hours.  Studies/Results: No results found.  ASSESMENT:   *  Post ERCP pancreatitis.  WBCs rising 18.7 >> 24.3 >> 25.  LR now at 125/hour. Lipase normalizing.      * Choledocholithiasis. 02/06/17 ERCP with sphinct, stone extraction. No remaining stones at final cholangiogram. Indocin PR given post ERCP.  LFTs all improved   * Cholelithiasis, cholecystitis. 02/05/17 lap chole, CBD stones on IOC.     PLAN   *  CT abd/pelvis with contrast ordered..    *  Spoke with Sierra Hernandez.  He is concerned that IV contrast with CT will injure the kidneys.  Toradol, being NSAID, also carries risk of renal injury, though so far renal fx is holding normal but oliguria is concerning.  Will have Sierra Myrtie Hernandez speak directly to Sierra Hernandez  re: CT.       Sierra Hernandez  02/09/2017, 8:27 AM Pager: [phone]   I have discussed the case with the PA, and that is the plan I formulated. I personally interviewed and examined the patient.  I had a lengthy visit with Sierra Hernandez and her parents today. They are understandably concerned about her lack of clinical progress. She is most bothered by generalized swelling including the abdomen and extremities. She is getting intermittent sharp crampy pain in different locations of the abdomen, which they find very worrisome. She is nauseated and remains nothing by mouth due to the pancreatitis and ileus. She has had no flatus or stool passage in the last 24 hours.  The positive signs are that her WBC seems to be peaking, her renal function and electrolytes remain normal. And I am certainly concerned  about her volume overload. It is not clear to me that her volume balance has been accurately recorded. Nevertheless, it sounds as if her urine output is less than desired and she and her family describe it as "dark". Her IV fluids were cut back to 1 25 mL an hour overnight.  Due to all that, I have told him why I advised the house staff not to pursue a CT scan with contrast today. While I suspect it will show pancreatic edema, perhaps ascites and an ileus, I do not want to risk the renal toxicity from IV contrast. Volume status can be tenuous in patients with acute pancreatitis, she has been receiving regular dose of Toradol over the last 24-36 hours.  For the time being, she will remain nothing by mouth, and I have explained how this ileus is the result of the acute pancreatitis. I expect the severity of the pancreatitis is most likely peaking, now we are dealing with the edema. I do not think she has infected pancreatic necrosis, which is part of why I am not pursuing a CT scan at this point.  I recommended furosemide 20 mg IV, which the nurse tells me she will administer soon. I also recommended trying an alternate opioid that ever she received the other day and was reportedly intolerant to. We need to back away from the Toradol.  I also told the nurse that if Sierra Hernandez does not have brisk urine output within an hour after the furosemide administration, she should do a bladder scan to see if there may be urinary retention." Close communication with the house staff about those findings and consideration of urine catheter placement if needed would be in order. I have communicated the plan to Sierra. Anthonette Hernandez, the intern on her medical team.  I have reassured Sierra Hernandez and her family that I believe he will make slow but steady progress and can expect to be here a few more days. If she is not able to take anything by mouth later this weekend, then she will need IV nutrition as she has been nothing by mouth nearly a  week.  I partner Sierra. Marina Hernandez will assume management of the consult service starting this evening and through the weekend,  and he will certainly follow this patient closely.  Over half of the 40 minute encounter was spent in counseling and coordination of care.      Charlie Pitter III Pager [phone]  Mon-Fri 8a-5p [phone] after 5p, weekends, holidays

## 2017-02-09 NOTE — Progress Notes (Signed)
Patient has complained of tightness in her groin area bilaterally and spasms in the mid epigastric area of her abdomen. She also has swelling in all extremities and significantly labia swelling. On-call night time IMTS MDs came and assessed patient at 3am and placed order for PRN simethicone and decreased the rate of her continuous fluids. Patient and family are concerned about patient and her pain and not passing gas at this point after surgery. MDs are aware that patient has not passed gas and that patient is uncomfortable. Patient is continuing to refuse to take PRN dilaudid because she doesn't like the way it makes her feel. She states that the toradol does work for the 2-3 hours after the medication is given and then her abdominal pain is back. MDs aware. Will continue to monitor and treat per MD orders.

## 2017-02-09 NOTE — Progress Notes (Signed)
3 Days Post-Op   Subjective/Chief Complaint: Pt feel much worse today  Feels more bloated and gas pain Pain in right groin when she walks or bends which is new  Could walk yesterday but unable to do so today No vomiting  No flatus or BM Mother at bedside and unhappy about her lack of progress     Objective: Vital signs in last 24 hours: Temp:  [98.9 F (37.2 C)-100.4 F (38 C)] 99.5 F (37.5 C) (09/14 0503) Pulse Rate:  [96-105] 105 (09/14 0503) Resp:  [16-18] 18 (09/14 0503) BP: (117-133)/(70-78) 133/78 (09/14 0503) SpO2:  [97 %-100 %] 100 % (09/14 0503) Last BM Date: 02/13/17  Intake/Output from previous day: 09/13 0701 - 09/14 0700 In: 7600.4 [I.V.:7545.4; IV Piggyback:55] Out: 860 [Urine:860] Intake/Output this shift: No intake/output data recorded.  GI: port  sites CDI distended tender in right inguinal canal region without obvious hernia  No rebound over [place] Burney's point   Diffuse tenderness without peritonitis   Lab Results:   Recent Labs  02/08/17 0404 02/09/17 0404  WBC 24.3* 25.0*  HGB 12.7 11.8*  HCT 38.0 35.4*  PLT 328 261   BMET  Recent Labs  02/08/17 0404 02/09/17 0404  NA 135 132*  K 3.6 4.2  CL 100* 99*  CO2 25 25  GLUCOSE 112* 101*  BUN 9 9  CREATININE 0.62 0.74  CALCIUM 7.4* 7.4*   PT/INR No results for input(s): LABPROT, INR in the last 72 hours. ABG No results for input(s): PHART, HCO3 in the last 72 hours.  Invalid input(s): PCO2, PO2  Studies/Results: No results found.  Anti-infectives: Anti-infectives    Start     Dose/Rate Route Frequency Ordered Stop   02/05/17 0830  ceFAZolin (ANCEF) IVPB 2g/100 mL premix     2 g 200 mL/hr over 30 Minutes Intravenous On call to O.R. 02/05/17 9604 02/05/17 0959   02/03/17 2200  metroNIDAZOLE (FLAGYL) IVPB 500 mg  Status:  Discontinued     500 mg 100 mL/hr over 60 Minutes Intravenous Every 8 hours 02/03/17 2127 02/07/17 0603   02/03/17 2200  cefTRIAXone (ROCEPHIN) 2 g in dextrose  5 % 50 mL IVPB  Status:  Discontinued     2 g 100 mL/hr over 30 Minutes Intravenous Every 24 hours 02/03/17 2127 02/07/17 0603      Assessment/Plan: S/P  LGB IOC Post procedure pancreatitis   Seems worse today than yesterday Was able to ambulate better yesterday  Discussed care with mother and team at bedside   Will check CT today since condition worse today than yesterday  Lipase  Trending down and LFT improving    This may be sequela of her pancreatitis but will check for other issues as well  Ileus  NPO for now    LOS: 6 days    Criag Wicklund A. 02/09/2017

## 2017-02-10 ENCOUNTER — Inpatient Hospital Stay (HOSPITAL_COMMUNITY): Payer: Managed Care, Other (non HMO)

## 2017-02-10 DIAGNOSIS — K8070 Calculus of gallbladder and bile duct without cholecystitis without obstruction: Secondary | ICD-10-CM

## 2017-02-10 DIAGNOSIS — K560 Paralytic ileus: Secondary | ICD-10-CM

## 2017-02-10 DIAGNOSIS — R601 Generalized edema: Secondary | ICD-10-CM

## 2017-02-10 LAB — CBC
HCT: 33.6 % — ABNORMAL LOW (ref 36.0–46.0)
Hemoglobin: 10.9 g/dL — ABNORMAL LOW (ref 12.0–15.0)
MCH: 29.3 pg (ref 26.0–34.0)
MCHC: 32.4 g/dL (ref 30.0–36.0)
MCV: 90.3 fL (ref 78.0–100.0)
Platelets: 359 10*3/uL (ref 150–400)
RBC: 3.72 MIL/uL — ABNORMAL LOW (ref 3.87–5.11)
RDW: 13.7 % (ref 11.5–15.5)
WBC: 21.8 10*3/uL — ABNORMAL HIGH (ref 4.0–10.5)

## 2017-02-10 LAB — COMPREHENSIVE METABOLIC PANEL
ALBUMIN: 2.2 g/dL — AB (ref 3.5–5.0)
ALK PHOS: 71 U/L (ref 38–126)
ALT: 137 U/L — AB (ref 14–54)
AST: 24 U/L (ref 15–41)
Anion gap: 10 (ref 5–15)
BUN: 8 mg/dL (ref 6–20)
CALCIUM: 7.7 mg/dL — AB (ref 8.9–10.3)
CO2: 24 mmol/L (ref 22–32)
CREATININE: 0.67 mg/dL (ref 0.44–1.00)
Chloride: 97 mmol/L — ABNORMAL LOW (ref 101–111)
GFR calc Af Amer: >60 mL/min (ref >60)
GFR calc non Af Amer: >60 mL/min (ref >60)
GLUCOSE: 88 mg/dL (ref 65–99)
Potassium: 3.8 mmol/L (ref 3.5–5.1)
SODIUM: 131 mmol/L — AB (ref 135–145)
TOTAL PROTEIN: 5.3 g/dL — AB (ref 6.5–8.1)
Total Bilirubin: 1.7 mg/dL — ABNORMAL HIGH (ref 0.3–1.2)

## 2017-02-10 NOTE — Progress Notes (Signed)
Pt complain of discomfort, tightness, and swelling to bilateral labia's. Labia's are noticeably/significantly swollen, skin is white, blanchable, not tender to touch.  Pt states the swelling has increased since yesterday.

## 2017-02-10 NOTE — Progress Notes (Signed)
Progress Note   Subjective  Chief Complaint: Generalized abdominal pain, abdominal swelling into the labia, pancreatitis, ileus  This morning, the patient and her family are discussing that the swelling in her labia has worsened overnight. This is so uncomfortable for her that she can no longer walk. She is found sitting up in her bedside chair. Apparently she received SMOG enema yesterday and had a large amount of "material", this helped with the sharp pain she was having into her groin yesterday and overall made her feel much better. She continues on miralax this morning. She continues with a generalized abdominal discomfort. No nausea or vomiting in the past 48 hours. She is requesting to have some clear liquids/"Italian ice". She has not passed flatus in the past 48 hours. Her family is concerned regarding her fluids continued at 125/h and the fact that they "contain salt" and that this may be adding to the fluid in her body. Patient is concerned that she still continues with decreased urine output, though this was somewhat better yesterday. Per medicine their planning a cath to make sure she is not retaining urine.   Objective   Vital signs in last 24 hours: Temp:  [98.3 F (36.8 C)-102.2 F (39 C)] 100.9 F (38.3 C) (09/15 0901) Pulse Rate:  [97-110] 97 (09/15 0445) Resp:  [17] 17 (09/15 0445) BP: (108-128)/(63-67) 108/63 (09/15 0445) SpO2:  [99 %] 99 % (09/15 0445) Last BM Date: 02/09/17 General:    Caucasian female in NAD Heart:  Regular rate and rhythm; no murmurs Lungs: Respirations even and unlabored, lungs CTA bilaterally Abdomen:  Soft, mild generalized tenderness and distention . Decreased bowel sounds. Extremities:  Edema and labia, legs and feet Neurologic:  Alert and oriented,  grossly normal neurologically. Psych:  Cooperative. Normal mood and affect.  Intake/Output from previous day: 09/14 0701 - 09/15 0700 In: 1010.4 [I.V.:1010.4] Out: 1300 [Urine:1300]  Lab  Results:  Recent Labs  02/08/17 0404 02/09/17 0404 02/10/17 0720  WBC 24.3* 25.0* 21.8*  HGB 12.7 11.8* 10.9*  HCT 38.0 35.4* 33.6*  PLT 328 261 359   BMET  Recent Labs  02/08/17 0404 02/09/17 0404 02/10/17 0720  NA 135 132* 131*  K 3.6 4.2 3.8  CL 100* 99* 97*  CO2 GLUCOSE 112* 101* 88  BUN CREATININE 0.62 0.74 0.67  CALCIUM 7.4* 7.4* 7.7*   LFT  Recent Labs  02/10/17 0720  PROT 5.3*  ALBUMIN 2.2*  AST 24  ALT 137*  ALKPHOS 71  BILITOT 1.7*      Assessment / Plan:   Assessment: 1. Post ERCP pancreatitis: White blood cells now decreasing, from 25-21.8 overnight, lipase and normalizing, generalized edema is not improved overnight 2. Choledocholithiasis: 02/06/17 ERCP with sphincterotomy and stone extraction 3. Cholelithiasis, cholecystitis: 02/05/17 laparoscopic cholecystectomy, common bile duct stones on IOC  Plan: 1. WBC trending down today, this is a positive sign. 2. Will leave decision of catheter to medicine 3. Suggested patient lay with her legs elevated today to try and decrease fluid build up in labia 4. Will discuss fluids with Dr. Marina Goodell and leave final decision to him 5. Again, not pursuing CT at this time as suspicion for infected pancreatic necrosis is low 6. Could consider bladder scan for urinary retention 7. Will discuss clears/italian ice specifically with Dr. Marina Goodell 8. Please await final recs from Dr. Marina Goodell later today  Thank you for your kind consultation, we will continue to follow.  LOS: 7 days   Unk Lightning  02/10/2017, 10:39 AM  Pager # [phone]  GI ATTENDING  Case discussed with [name]. Interval history and data reviewed. Patient personally seen and examined. Both parents at bedside. Overall she is doing better. Less pain. No nausea or vomiting. Asking for liquids. Feels distended. Also complains of labial swelling. Physical exam shows her to appear well overall. Her abdomen is distended with  decreased bowel sounds. Labial swelling particularly on the right without obvious complication(examined with mother in room at their request). Some mild lower extremity and presacral edema. IV fluids have been decreased. Lungs are clear. Labs all look good. Spent considerable amount of time with patient and her parents reviewing her clinical course, treatment plan to date, and treatment plan moving forward. At this point, I have encouraged her to minimize or avoid narcotics and increase activity. We will allow sips of clear liquids. Continue to monitor laboratories. We can decrease IV fluids when she is taking adequate by mouth's.. GI will continue to follow  Sierra Hernandez. Eda Keys., M.D. [facility]

## 2017-02-10 NOTE — Progress Notes (Signed)
Patient ID: Sierra Hernandez, female   DOB: May 22, 1991, 26 y.o.   MRN: [id number]  [facility] Surgery Progress Note  4 Days Post-Op  Subjective: CC- pancreatitis Patient with significant increase in swelling in labia and extremities. Given 1 dose of lasix yesterday, elevating now. Denies SOB. States that the pain in her upper back has improved. Groin pain improved after enema yesterday. She has since had 2 BMs. Passing minimal flatus. No recent n/v. Continues to have abdominal bloating and intermittent sharp abdominal pains. Pain is mostly in her lower abdomen.  Objective: Vital signs in last 24 hours: Temp:  [98.3 F (36.8 C)-102.2 F (39 C)] 100.9 F (38.3 C) (09/15 0901) Pulse Rate:  [97-110] 97 (09/15 0445) Resp:  [17] 17 (09/15 0445) BP: (108-128)/(63-67) 108/63 (09/15 0445) SpO2:  [99 %] 99 % (09/15 0445) Last BM Date: 02/09/17  Intake/Output from previous day: 09/14 0701 - 09/15 0700 In: 1010.4 [I.V.:1010.4] Out: 1300 [Urine:1300] Intake/Output this shift: No intake/output data recorded.  PE: Gen: Alert, NAD, pleasant HEENT: EOM's intact, pupils equal and round Cardio: RRR Pulm: CTAB, effort normal Cardio: RRR Abd: Soft, distended, tympanic with few BS, incisions C/D/I, mild global tenderness with most significant tenderness in epigastric region and lower quadrants Skin: no rashes noted, warm and dry Ext: edema noted to BUE/BLE  Lab Results:   Recent Labs  02/09/17 0404 02/10/17 0720  WBC 25.0* 21.8*  HGB 11.8* 10.9*  HCT 35.4* 33.6*  PLT 261 359   BMET  Recent Labs  02/09/17 0404 02/10/17 0720  NA 132* 131*  K 4.2 3.8  CL 99* 97*  CO2 25 24  GLUCOSE 101* 88  BUN 9 8  CREATININE 0.74 0.67  CALCIUM 7.4* 7.7*   PT/INR No results for input(s): LABPROT, INR in the last 72 hours. CMP     Component Value Date/Time   NA 131 (L) 02/10/2017 0720   K 3.8 02/10/2017 0720   CL 97 (L) 02/10/2017 0720   CO2 24 02/10/2017 0720   GLUCOSE 88  02/10/2017 0720   BUN 8 02/10/2017 0720   CREATININE 0.67 02/10/2017 0720   CALCIUM 7.7 (L) 02/10/2017 0720   PROT 5.3 (L) 02/10/2017 0720   ALBUMIN 2.2 (L) 02/10/2017 0720   AST 24 02/10/2017 0720   ALT 137 (H) 02/10/2017 0720   ALKPHOS 71 02/10/2017 0720   BILITOT 1.7 (H) 02/10/2017 0720   GFRNONAA >60 02/10/2017 0720   GFRAA >60 02/10/2017 0720   Lipase     Component Value Date/Time   LIPASE 155 (H) 02/09/2017 0404       Studies/Results: No results found.  Anti-infectives: Anti-infectives    Start     Dose/Rate Route Frequency Ordered Stop   02/05/17 0830  ceFAZolin (ANCEF) IVPB 2g/100 mL premix     2 g 200 mL/hr over 30 Minutes Intravenous On call to O.R. 02/05/17 9562 02/05/17 0959   02/03/17 2200  metroNIDAZOLE (FLAGYL) IVPB 500 mg  Status:  Discontinued     500 mg 100 mL/hr over 60 Minutes Intravenous Every 8 hours 02/03/17 2127 02/07/17 0603   02/03/17 2200  cefTRIAXone (ROCEPHIN) 2 g in dextrose 5 % 50 mL IVPB  Status:  Discontinued     2 g 100 mL/hr over 30 Minutes Intravenous Every 24 hours 02/03/17 2127 02/07/17 0603       Assessment/Plan IBS - takes miralax almost daily at home  Calculus of bile duct with acute cholecystitis without mention of obstruction S/p lap chole 9/10 Dr.  Cornett - POD 5 - s/p ERCP 9/11 - Post-ERCP pancreatitis - lipase trending down (155 on 9/14). LFTs trending down. - WBC slightly down 21.8 from 25. TMAX 102.  ID - rocephin/flagyl 9/8>>9/12 FEN - IVF, NPO except sips of clears VTE - SCDs, lovenox Foley - none Follow up - 2-3 weeks [facility] clinic  Plan - Check abdominal film. Encourage IS. Alternate elevation with short frequent ambulation. Continue bowel regimen with daily miralax.  Upper abdominal/back pain seems to be improving, and labs going in the right direction. Will discuss decreasing IVF and giving another dose of lasix with MD.   LOS: 7 days    Franne Forts , [facility] Surgery 02/10/2017,  11:39 AM Pager: [phone] Consults: [phone] Mon-Fri 7:00 am-4:30 pm Sat-Sun 7:00 am-11:30 am

## 2017-02-10 NOTE — Progress Notes (Signed)
   Subjective: Patient was complaining of worsening of her labial swelling, unable to walk because of that. She continued to get crampy abdominal pain, although slightly improved as compared to yesterday. She denied any nausea or vomiting. She is still not passing gas, had a small BM after an enema yesterday.  Objective:  Vital signs in last 24 hours: Vitals:   02/09/17 2215 02/09/17 2309 02/10/17 0445 02/10/17 0901  BP: 128/67  108/63   Pulse: (!) 110  97   Resp: 17  17   Temp: (!) 102.2 F (39 C) 99.2 F (37.3 C) 98.3 F (36.8 C) (!) 100.9 F (38.3 C)  TempSrc: Oral Oral Oral Oral  SpO2: 99%  99%   Weight:      Height:       Gen. Well-developed, well-nourished young lady, sitting in chair, in no acute distress. Extremities. Marked labial edema, no erythema or tenderness, 1+ lower extremity edema. Pulses intact and symmetrical. Abdomen. Distended with generalized diffuse tenderness, bowel sounds hypoactive. Chest. Clear bilaterally. CV. Regular rate and rhythm.  Assessment/Plan:  Choledocholithiasis, cholecystitis, Post-ERCP Pancreatitis, Post-operative Ileus . Her pancreatitis seems improving, she still have hypoactive bowel sounds most likely due to postoperative ileus. She had profound swelling of her genitalia, part of that can be dependent because of excessive IV fluid, there is a possibility of increased intra-abdominal pressure/abdominal compartment syndrome which  may be causing increased pressure on her abdominal vessels resulting in decreased urinary output and this profound genital edema. Surgery is checking KUB today. -Check bladder pressure. -She will get benefit with a change in her IV fluid from LR to half normal saline and D5. We appreciate surgery and GI recommendations. -Continue pain management as needed. - continue MiraLAX.  Transaminitis. Improving, ALT an alkaline phosphatase has been normalized,AST and bilirubin mildly elevated.  Dispo: Anticipated  discharge in approximately 1-2 day(s).   Arnetha Courser, MD 02/10/2017, 1:47 PM Pager: [phone]

## 2017-02-11 DIAGNOSIS — K851 Biliary acute pancreatitis without necrosis or infection: Secondary | ICD-10-CM

## 2017-02-11 DIAGNOSIS — R5082 Postprocedural fever: Secondary | ICD-10-CM

## 2017-02-11 LAB — COMPREHENSIVE METABOLIC PANEL
ALBUMIN: 1.8 g/dL — AB (ref 3.5–5.0)
ALK PHOS: 67 U/L (ref 38–126)
ALT: 106 U/L — AB (ref 14–54)
AST: 44 U/L — AB (ref 15–41)
Anion gap: 4 — ABNORMAL LOW (ref 5–15)
BILIRUBIN TOTAL: 1.7 mg/dL — AB (ref 0.3–1.2)
BUN: 5 mg/dL — ABNORMAL LOW (ref 6–20)
CALCIUM: 7.5 mg/dL — AB (ref 8.9–10.3)
CO2: 27 mmol/L (ref 22–32)
CREATININE: 0.61 mg/dL (ref 0.44–1.00)
Chloride: 101 mmol/L (ref 101–111)
GFR calc non Af Amer: >60 mL/min (ref >60)
Glucose, Bld: 89 mg/dL (ref 65–99)
Potassium: 3.5 mmol/L (ref 3.5–5.1)
Sodium: 132 mmol/L — ABNORMAL LOW (ref 135–145)
Total Protein: 4.7 g/dL — ABNORMAL LOW (ref 6.5–8.1)

## 2017-02-11 LAB — URINALYSIS, ROUTINE W REFLEX MICROSCOPIC
BACTERIA UA: NONE SEEN
BILIRUBIN URINE: NEGATIVE
Glucose, UA: NEGATIVE mg/dL
Ketones, ur: 80 mg/dL — AB
LEUKOCYTES UA: NEGATIVE
Nitrite: NEGATIVE
PROTEIN: 30 mg/dL — AB
SPECIFIC GRAVITY, URINE: 1.013 (ref 1.005–1.030)
pH: 7 (ref 5.0–8.0)

## 2017-02-11 LAB — CBC
HCT: 31.8 % — ABNORMAL LOW (ref 36.0–46.0)
Hemoglobin: 10.7 g/dL — ABNORMAL LOW (ref 12.0–15.0)
MCH: 29.6 pg (ref 26.0–34.0)
MCHC: 33.6 g/dL (ref 30.0–36.0)
MCV: 88.1 fL (ref 78.0–100.0)
PLATELETS: 355 10*3/uL (ref 150–400)
RBC: 3.61 MIL/uL — AB (ref 3.87–5.11)
RDW: 13.3 % (ref 11.5–15.5)
WBC: 19.8 10*3/uL — ABNORMAL HIGH (ref 4.0–10.5)

## 2017-02-11 MED ORDER — FAMOTIDINE 20 MG PO TABS
10.0000 mg | ORAL_TABLET | Freq: Two times a day (BID) | ORAL | Status: DC
  Administered 2017-02-11 – 2017-02-13 (×5): 10 mg via ORAL
  Filled 2017-02-11 (×7): qty 1

## 2017-02-11 MED ORDER — FUROSEMIDE 10 MG/ML IJ SOLN
20.0000 mg | Freq: Once | INTRAMUSCULAR | Status: AC
  Administered 2017-02-11: 20 mg via INTRAVENOUS
  Filled 2017-02-11: qty 2

## 2017-02-11 NOTE — Progress Notes (Signed)
   Subjective: Patient continues to experience significant labial swelling and states it becomes worse with ambulation due to sensation of pressure and pulling. She complains of right sided abdominal pian that is tight in nature. She states she is no longer experiencing her previous crampy diffuse abdominal pain. She states that she has been elevating her legs, which has helped some with the swelling in her legs. She attempted to elevate her lower half with the head of her be more flat but began to feel discomfort in the form of pressure at her upper abdomen/chest, this discomfort was relieved after changing from this position. She states that she did not receive bladder pressure testing. She inquired as to the source of her fever and states that she has not been feeling symptomatic from the fever. She states her temperature was checked around 5am and was 101 and she received tylenol. She denied any nausea or vomiting. She is now having liquid BMs at about every other trip to the restroom.   Objective:  Vital signs in last 24 hours: Vitals:   02/10/17 1538 02/10/17 2048 02/10/17 2240 02/11/17 0435  BP: 131/70 128/67  133/70  Pulse: (!) 113 (!) 111  (!) 108  Resp: Temp: 99 F (37.2 C) (!) 101.9 F (38.8 C) (!) 100.5 F (38.1 C) (!) 101.4 F (38.6 C)  TempSrc: Oral Oral Oral Oral  SpO2: 99% 100%  100%  Weight:      Height:       Gen. Well-developed, well-nourished, in bed, in no acute distress. Extremities. Marked labial edema R>L, no erythema or tenderness, 1+ LLE and Trace RLE edema. Pulses intact and symmetrical. Abdomen. Mildly Distended with tenderness to palpation at all quadrants, NABS. Chest. Clear bilaterally. CV. Regular rate and rhythm.  Assessment/Plan:  Choledocholithiasis, cholecystitis, Post-ERCP Pancreatitis, Post-operative Ileus . Her pancreatitis seems improving, bowel sounds normactive and is now having liquid bowl movements. She has profound swelling of her  genitalia, part of that can be dependent because of excessive IV fluid, there is a possibility of increased intra-abdominal pressure/abdominal compartment syndrome which  may be causing increased pressure on her abdominal vessels resulting in decreased urinary output and this profound genital edema. KUB yesterday showed normal bowl gas pattern without signs of illeus. Per GI, plan for CT Abdomen if fever persists 1 week post ERCP. - We appreciate surgery and GI recommendations. - Check bladder pressure - DC IV Fluids, Encourage PO intake, Lasix  once - Continue pain management as needed. - Obtain blood culture for fever over 101. - UA - Continue MiraLAX.  Transaminitis. Improving, alkaline phosphatase has been normalized, AST (breifly normalized), ALT and bilirubin mildly elevated.  Dispo: Anticipated discharge in approximately 1-3 day(s).   Beola Cord, MD 02/11/2017, 8:48 AM Pager: [phone]

## 2017-02-11 NOTE — Progress Notes (Signed)
Pt running fever gave her tylenol at 2135, drop down to100.5  Now she is running a temp of 101.4 pt is due to have another tylenol, on call physician Jennfer from [facility] has been informed of how pt temp has been running, no new order will continue to monitor

## 2017-02-11 NOTE — Progress Notes (Signed)
Patient ID: Sierra Hernandez, female   DOB: [birth year], 26 y.o.   MRN: [id number]  [facility] Surgery Progress Note  5 Days Post-Op  Subjective: CC- n/v Continues to have diffuse abdominal pain. States that she has intermittent sharp LLQ, LUQ and RUQ pains. Less abdominal bloating. Having multiple loose BMs and passing small amount of flatus. States that she was burping earlier and felt some gastric reflux after sipping on clears. Vomited once this morning large amount of clear green/brown fluid. Abdominal pain improved after emesis. Swelling persists in labia and lower extremities. Worse with up/ambulating, slowly improves with ambulation. TMAX 101.4. Using IS some but not consistently. WBC still elevated but trending down.  Objective: Vital signs in last 24 hours: Temp:  [99 F (37.2 C)-101.9 F (38.8 C)] 101.4 F (38.6 C) (09/16 0435) Pulse Rate:  [108-113] 108 (09/16 0435) Resp:  [18-20] 18 (09/16 0435) BP: (128-133)/(67-70) 133/70 (09/16 0435) SpO2:  [99 %-100 %] 100 % (09/16 0435) Last BM Date: 02/10/17  Intake/Output from previous day: 09/15 0701 - 09/16 0700 In: -  Out: 2850 [Urine:2850] Intake/Output this shift: Total I/O In: -  Out: 801 [Urine:800; Stool:1]  PE: Gen: Alert, NAD HEENT: EOM's intact, pupils equal and round Cardio: RRR Pulm: CTAB, effort normal Cardio: RRR Abd: Soft, distended (slightly improved from yesterday), tympanic with few BS, incisions C/D/I, mild global tenderness with most significant tenderness in LLQ and LUQ Skin: no rashes noted, warm and dry Ext: 1+ edema noted to BUE/BLE  Lab Results:   Recent Labs  02/10/17 0720 02/11/17 0525  WBC 21.8* 19.8*  HGB 10.9* 10.7*  HCT 33.6* 31.8*  PLT 359 355   BMET  Recent Labs  02/10/17 0720 02/11/17 0525  NA 131* 132*  K 3.8 3.5  CL 97* 101  CO2 24 27  GLUCOSE 88 89  BUN 8 5*  CREATININE 0.67 0.61  CALCIUM 7.7* 7.5*   PT/INR No results for input(s): LABPROT, INR in the  last 72 hours. CMP     Component Value Date/Time   NA 132 (L) 02/11/2017 0525   K 3.5 02/11/2017 0525   CL 101 02/11/2017 0525   CO2 27 02/11/2017 0525   GLUCOSE 89 02/11/2017 0525   BUN 5 (L) 02/11/2017 0525   CREATININE 0.61 02/11/2017 0525   CALCIUM 7.5 (L) 02/11/2017 0525   PROT 4.7 (L) 02/11/2017 0525   ALBUMIN 1.8 (L) 02/11/2017 0525   AST 44 (H) 02/11/2017 0525   ALT 106 (H) 02/11/2017 0525   ALKPHOS 67 02/11/2017 0525   BILITOT 1.7 (H) 02/11/2017 0525   GFRNONAA >60 02/11/2017 0525   GFRAA >60 02/11/2017 0525   Lipase     Component Value Date/Time   LIPASE 155 (H) 02/09/2017 0404       Studies/Results: Dg Abd Portable 1v  Result Date: 02/10/2017 CLINICAL DATA:  Evaluate ileus. Recent gallbladder surgery, pancreatitis, and ERCP. EXAM: PORTABLE ABDOMEN - 1 VIEW COMPARISON:  February 07, 2017 FINDINGS: Air-filled colon is identified along its entire length with gas in the rectum. No small bowel dilatation. Cholecystectomy clips. No other acute abnormalities. IMPRESSION: Normal bowel gas pattern with no definitive ileus. There is gas throughout the nondistended colon from the cecum through the rectum. Electronically Signed   By: Gerome Sam III M.D   On: 02/10/2017 17:28    Anti-infectives: Anti-infectives    Start     Dose/Rate Route Frequency Ordered Stop   02/05/17 0830  ceFAZolin (ANCEF) IVPB 2g/100 mL premix  2 g 200 mL/hr over 30 Minutes Intravenous On call to O.R. 02/05/17 1610 02/05/17 0959   02/03/17 2200  metroNIDAZOLE (FLAGYL) IVPB 500 mg  Status:  Discontinued     500 mg 100 mL/hr over 60 Minutes Intravenous Every 8 hours 02/03/17 2127 02/07/17 0603   02/03/17 2200  cefTRIAXone (ROCEPHIN) 2 g in dextrose 5 % 50 mL IVPB  Status:  Discontinued     2 g 100 mL/hr over 30 Minutes Intravenous Every 24 hours 02/03/17 2127 02/07/17 0603       Assessment/Plan IBS - takes miralax almost daily at home  Calculus of bile duct with acute cholecystitis  without mention of obstruction S/p lap chole 9/10 Dr. Luisa Hart - POD 6 - s/p ERCP 9/11 - Post-ERCP pancreatitis - lipase trending down (155 on 9/14). LFTs still elevated - WBC slightly down 19.8 from 21.8. TMAX 101.4 - XR yesterday showed no ileus  ID - rocephin/flagyl 9/8>>9/12 FEN - IVF, CLD VTE - SCDs, lovenox Foley - none Follow up - 2-3 weeks [facility] clinic  Plan - IVF stopped by internal medicine and giving single dose of lasix; checking blood cultures for persistent fevers. Continue elevating extremities. Encouraged patient to use IS more frequently.  Check CBC/CMP/lipase in AM. May need to consider alternative form of nutrition if unable to progress diet.  May also need to consider CT abdomen if abdominal pain not improving.   LOS: 8 days    Franne Forts , [facility] Surgery 02/11/2017, 12:04 PM Pager: [phone] Consults: [phone] Mon-Fri 7:00 am-4:30 pm Sat-Sun 7:00 am-11:30 am

## 2017-02-11 NOTE — Progress Notes (Signed)
Progress Note   Subjective  Chief Complaint: Generalized Abdominal pain, Abdominal distension, post ERCP pancreatitis, ileus  This morning the patient is found sitting up in bed with her father by her bedside. She explains that she has been having more gas pains and some RUQ pain, which she was told by another doctor was likely related to her recent surgery. There has been no worsening of labial swelling and the fluid in her legs is somewhat improved. Her largest concern today is that she has continued to run fevers which are getting up to 101 degrees fahrenheit. Overall she feels the same and maybe slightly improved. Patient does report some reflux symptoms after [place] ices and jello this morning.   Objective   Vital signs in last 24 hours: Temp:  [99 F (37.2 C)-101.9 F (38.8 C)] 101.4 F (38.6 C) (09/16 0435) Pulse Rate:  [108-113] 108 (09/16 0435) Resp:  [18-20] 18 (09/16 0435) BP: (128-133)/(67-70) 133/70 (09/16 0435) SpO2:  [99 %-100 %] 100 % (09/16 0435) Last BM Date: 02/10/17 General:    white female in NAD Heart:  Regular rate and rhythm; no murmurs Lungs: Respirations even and unlabored, lungs CTA bilaterally Abdomen:  Soft, mild generalized tenderness and distension and nondistended. Normal bowel sounds. Extremities:  Edema in labia, legs and feet (somewhat decreased). Neurologic:  Alert and oriented,  grossly normal neurologically. Psych:  Cooperative. Normal mood and affect.  Intake/Output from previous day: 09/15 0701 - 09/16 0700 In: -  Out: 2850 [Urine:2850] Intake/Output this shift: Total I/O In: -  Out: 801 [Urine:800; Stool:1]  Lab Results:  Recent Labs  02/09/17 0404 02/10/17 0720 02/11/17 0525  WBC 25.0* 21.8* 19.8*  HGB 11.8* 10.9* 10.7*  HCT 35.4* 33.6* 31.8*  PLT 261 359 355   BMET  Recent Labs  02/09/17 0404 02/10/17 0720 02/11/17 0525  NA 132* 131* 132*  K 4.2 3.8 3.5  CL 99* 97* 101  CO2 GLUCOSE 101* 88 89  BUN 9  8 5*  CREATININE 0.74 0.67 0.61  CALCIUM 7.4* 7.7* 7.5*   LFT  Recent Labs  02/11/17 0525  PROT 4.7*  ALBUMIN 1.8*  AST 44*  ALT 106*  ALKPHOS 67  BILITOT 1.7*    Studies/Results: Dg Abd Portable 1v  Result Date: 02/10/2017 CLINICAL DATA:  Evaluate ileus. Recent gallbladder surgery, pancreatitis, and ERCP. EXAM: PORTABLE ABDOMEN - 1 VIEW COMPARISON:  February 07, 2017 FINDINGS: Air-filled colon is identified along its entire length with gas in the rectum. No small bowel dilatation. Cholecystectomy clips. No other acute abnormalities. IMPRESSION: Normal bowel gas pattern with no definitive ileus. There is gas throughout the nondistended colon from the cecum through the rectum. Electronically Signed   By: Gerome Sam III M.D   On: 02/10/2017 17:28     Assessment / Plan:   Assessment: 1. Post ERCP pancreatitis:  WBC continue to trend down, patient with fevers increasing, she is having BM now-loose and about every time she urinates-slightly less distended-xray yesterday shows no ileus 2. Choledocholithiasis 3. Cholelithiasis, cholecystitis 4. GERD: patient complaining of some reflux symptoms today  Plan: 1. Continue supportive measures 2. Will discuss fevers with Dr. Marina Goodell, per internal medicine they are planning blood cultures and possible bladder scan? 3. Ordered Pepcid BID for reported reflux symptoms-likely related to current diet 4. Please await final recs from Dr. Marina Goodell  Thank you for your kind consultation, we will continue to follow.  LOS: 8 days  Unk Lightning  02/11/2017, 10:47 AM Pager # [phone]  GI ATTENDING  Interval history, laboratories, x-rays reviewed. Patient personally seen and examined. Parents in room. Agree with above interval progress note as outlined. IMPRESSION: 1. Post ERCP pancreatitis. No evidence for SIRS. All laboratory parameters continue to improve. Had an episode of vomiting today and feels better after. See below #4 2.  Anasarca. This is secondary to vigorous hydration, possible capillary leak, and hypoalbuminemia 3. Fevers. Suspect secondary to pancreatitis. Other causes for infection being ruled out. Patient has DVT prophylaxis with no evidence for such on my physical exam today 4. Abdominal distention. Abdominal films with normal bowel gas pattern. She almost certainly has ascites from volume resuscitation PLAN: 1. Follow-up UA with culture and blood cultures 2. Continue to ambulate 3. Encourage by mouth intake. Hold IV fluids 4. Lasix 20 mg IV 1 5. Continue to monitor closely. If fevers persist at 1 week post ERCP, will consider contrast-enhanced CT of the abdomen pelvis  Long discussion with the patient and her family regarding all the above. Multiple questions answered to their satisfaction  [name]. Eda Keys., M.D. [facility] Division of Gastroenterology

## 2017-02-11 NOTE — Discharge Summary (Signed)
Name: Sierra Hernandez MRN: [id number] DOB: Oct 24, 1990 25 y.o. PCP: System, Pcp Not In  Date of Admission: 02/03/2017  4:53 PM Date of Discharge: 02/19/2017 Attending Physician: Sid Falcon, MD  Discharge Diagnosis:  Principal Problem:   Cholelithiasis with choledocholithiasis Active Problems:   Acute pancreatitis   Protein-calorie malnutrition, severe   Abdominal distention   Abdominal fluid collection   Abnormal liver function tests   Intraabdominal fluid collection   Discharge Medications: Allergies as of 02/19/2017      Reactions   Minocycline Swelling, Other (See Comments)   Joint pain and swelling, exhaustion      Medication List    TAKE these medications   ALEVE 220 MG tablet Generic drug:  naproxen sodium Take 220-440 mg by mouth 2 (two) times daily as needed (pain/headache).   amoxicillin-clavulanate 875-125 MG tablet Commonly known as:  AUGMENTIN Take 1 tablet by mouth every 12 (twelve) hours.   cetirizine 10 MG tablet Commonly known as:  ZYRTEC Take 10 mg by mouth at bedtime.   EPIPEN 2-PAK 0.3 mg/0.3 mL Soaj injection Generic drug:  EPINEPHrine Inject 0.3 mg into the muscle once as needed (severe allergic reaction).   fluticasone 50 MCG/ACT nasal spray Commonly known as:  FLONASE Place 1 spray into both nostrils daily as needed (seasonal allergies).   ibuprofen 200 MG tablet Commonly known as:  ADVIL,MOTRIN Take 200 mg by mouth 2 (two) times daily as needed for headache (pain).   multivitamin with minerals Tabs tablet Take 1 tablet by mouth daily.   norgestimate-ethinyl estradiol 0.25-35 MG-MCG tablet Commonly known as:  ORTHO-CYCLEN,SPRINTEC,PREVIFEM Take 1 tablet by mouth at bedtime.   polyethylene glycol packet Commonly known as:  MIRALAX / GLYCOLAX Take 8.5 g by mouth daily as needed (constipation). Mix in 8 oz liquid and drink   ranitidine 150 MG tablet Commonly known as:  ZANTAC Take 1 tablet (150 mg total) by mouth every  morning. Before breakfast            Discharge Care Instructions        Start     Ordered   02/19/17 0000  amoxicillin-clavulanate (AUGMENTIN) 875-125 MG tablet  Every 12 hours     02/19/17 1138   02/19/17 0000  Increase activity slowly     02/19/17 1138   02/19/17 0000  Diet - low sodium heart healthy     02/19/17 1138   02/19/17 0000  Discharge instructions    Comments:  Thank you for allowing [place] to care for you.  Please continue taking Augmentin for 2 days after discharge.  Avoid taking Tylenol until your liver function returns to normal.  You have follow up appointments scheduled with: - Lab work on 10/02 or 10/03 prior to appointments - Gastroenterology on 03/01/17 - Surgery on 03/02/17  Please schedule a follow up visit with your primary care provider in the next 1-2 weeks   02/19/17 1138   02/19/17 0000  Call MD for:  temperature >100.4     02/19/17 1138   02/19/17 0000  Call MD for:  persistant nausea and vomiting     02/19/17 1138   02/19/17 0000  Call MD for:  severe uncontrolled pain     02/19/17 1138   02/19/17 0000  Call MD for:  redness, tenderness, or signs of infection (pain, swelling, redness, odor or green/yellow discharge around incision site)     02/19/17 1138      Disposition and follow-up:   Sierra Hernandez was discharged  from [facility] in Stable condition.  At the hospital follow up visit please address:  1. Ensure resolution of abdominal pain, edema, transaminates, and lab abnormalities.  2. Ensure patient is having regular bowl movements.  3.  Labs / imaging needed at time of follow-up: CMP  4.  Pending labs/ test needing follow-up: None  Follow-up Appointments: Follow-up Information    Erroll Luna, MD. Go on 03/02/2017.   Specialty:  General Surgery Why:  Your appointment is 03/02/17 at 10:00AM for follow up from gallbladder surgery. Please arrive 30 minutes prior to your appointment to check in and fill out  paperwork. Contact information: [address] [place] [address] [phone]        Levin Erp, [place] Follow up on 03/01/2017.   Specialty:  Gastroenterology Why:  9:45 AM appointment with PA at GI practice.   Contact information: [place] [address] [phone]        [facility]. Go to.   Why:  go to lab in the basement on 10/2 or 10/3 any time 7:30 AM - 5:30 PM for blood work.   Contact information: [place] [phone]          Hospital Course by problem list:   Choledocholithiasis, cholecystitis The pt had previously been evaluated as an outpatient and found to have symptomatic cholelithiasis with a lap chole already scheduled. She developed worsened abdominal pain, nausea, jaundice/icterus, and itching and presented to the hospital. Her initial labs were consistent with obstruction and choledocholithiasis. She had MRCP performed that was not suggestive of acute cholecystitis at the time. She then underwent successful laparoscopic cholecystectomy which did note evidence of acute cholecystitis. Intra-operative cholangiogram also revealed a common bile duct. Subsequent ERCP removed the remaining common bile duct stone. Her bilirubin, alk phos improved during the initial post-operative course, however she did develop complications below.   Post-ERCP Pancreatitis, Post-Op Peritonitis After ERCP, the pt experienced increased abdominal pain, nausea, and vomiting, lipase resulted >1500 consistent with acute pancreatitis, likely due to ERCP performed the day prior. She was treated with bowel rest, anti-emetics, IVF, and pain control. She initially experienced side effects to dilaudid and tried to avoid narcotic pain medications, using IV Toradol instead. As her pain persisted, she was willing to try Fentanyl which provided relief without side effects. She also experienced edema due to  her IVF including uncomfortable labial swelling. The need for fluid resuscitation was balanced with her edema and her fluids were slowly decreased. She later developed fever (Tmax of 102). He fever improved but she subsequently had 2 fevers of 101 or greater and blood cultures were drawn. With this persistent fever and her white count trending up (which had been trending down), a CT of her abdomen and pelvis was perfromed to rule out necrosis or other problems. This scan showed diffuse abdominal and pelvic ascites with scattered interloop abdominopelvic fluid collections and diffuse peritoneal and mesenteric enhancement compatible with postoperative peritonitis possibly related to a bile leak. Meropenem was initiated and NM hepatobiliary scan was ordered, which showed no evidence of bile leak. The patient was started on TPN (due to 10-11 days with minimal oral intake and recurrence of ileus on CT). After a couple of days on this therapy the patient's white count began to trend down again, her fevers stopped spiking, and she clinically improved. Meropenem was transitioned to Augmentin. Her diet was advanced as tolerated to  a low fat (Heart) diet and her TPN was weaned and discontinued. She was discharged on Augmentin.  Ileus, Protein-Calorie Malnutrition  Due to the multiple procedures and acute pancreatitis, the pt developed ileus with abdominal distension, hypoactive BS, and a prolonged period without a BM or passing flatus during the hospitalization. A bowel regimen and ambulation was not successful in alleviating her sx. She eventually received relief from an enema which helped to improve her abdominal pain and discomfort. She was able to have some liquid BMs but was unable to eat much beyond liquids. After 10-11 days with minimal oral intake, TPN was initiated on 9/19. She received TPN as her diet was slowly advanced to Heart diet and she was again having bowl movements. TPN was discontinued after she was  able to eat.  Transaminitis The pt presented with elevation of transaminases, likely related to the obstruction caused by choledocholithiasis. However, she does have a history of transient transaminitis which was attributed to medications (minocycline, suliden) as it resolved with medication discontinuation and other work up was unremarkable (including ferritin, alpha-1 antitrypsin, HBsAg, HCV ab, AMA, ASMA). Her LFTs initially slowly trended down during the admission but began to trend up as she was spiking fevers. They then began to trend down again and at discharge were AST 141, ALT 298, Alk Phos 138. She will be getting follow up labs on 10/2 or 10/3 prior to her follow up appointments.  Discharge Vitals:   BP 113/68 (BP Location: Left Arm)   Pulse 86   Temp 98.4 F (36.9 C) (Oral)   Resp 15   Ht '5\' 4"'  (1.626 m)   Wt 122 lb 12.8 oz (55.7 kg)   LMP 02/02/2017 (Exact Date) Comment: neg preg test 02/03/17  SpO2 100%   BMI 21.08 kg/m   Pertinent Labs, Studies, and Procedures:  CBC Latest Ref Rng & Units 02/19/2017 02/18/2017 02/17/2017  WBC 4.0 - 10.5 K/uL 13.1(H) 13.8(H) 11.1(H)  Hemoglobin 12.0 - 15.0 g/dL 11.1(L) 11.1(L) 10.7(L)  Hematocrit 36.0 - 46.0 % 34.4(L) 34.5(L) 33.1(L)  Platelets 150 - 400 K/uL 784(H) 784(H) 774(H)   BMP Latest Ref Rng & Units 02/19/2017 02/18/2017 02/17/2017  Glucose 65 - 99 mg/dL 100(H) 104(H) 117(H)  BUN 6 - 20 mg/dL '10 10 7  ' Creatinine 0.44 - 1.00 mg/dL 0.58 0.61 0.55  Sodium 135 - 145 mmol/L 134(L) 134(L) 135  Potassium 3.5 - 5.1 mmol/L 4.1 3.9 3.8  Chloride 101 - 111 mmol/L 98(L) 98(L) 100(L)  CO2 22 - 32 mmol/L '26 28 26  ' Calcium 8.9 - 10.3 mg/dL 8.9 9.1 8.5(L)   Laparoscopic Cholecystectomy - see operative note for full details  ERCP with stone removal - see procedure note for full details   CT Abdomen/Pelvis W (02/13/17): IMPRESSION: - Status post cholecystectomy. Diffuse abdominal and pelvic ascites, scattered interloop abdominopelvic fluid  collections, and diffuse peritoneal and mesenteric enhancement. Findings compatible with postoperative peritonitis possibly related to a bile leak. Consider further evaluation with hepatobiliary scan. - Small scattered locules of free air, compatible with a recent laparoscopic surgery. -Nonspecific small bowel dilatation, favor postoperative ileus over obstruction  NM Hepatobiliary Scan 02/14/17: IMPRESSION: 1. No evidence of bile leak or biliary obstruction status post cholecystectomy. 2. Transient reflux of duodenal activity into the stomach.  Discharge Instructions: Discharge Instructions    Call MD for:  persistant nausea and vomiting    Complete by:  As directed    Call MD for:  redness, tenderness, or signs of infection (pain, swelling, redness,  odor or green/yellow discharge around incision site)    Complete by:  As directed    Call MD for:  severe uncontrolled pain    Complete by:  As directed    Call MD for:  temperature >100.4    Complete by:  As directed    Diet - low sodium heart healthy    Complete by:  As directed    Discharge instructions    Complete by:  As directed    Thank you for allowing [place] to care for you.  Please continue taking Augmentin for 2 days after discharge.  Avoid taking Tylenol until your liver function returns to normal.  You have follow up appointments scheduled with: - Lab work on 10/02 or 10/03 prior to appointments - Gastroenterology on 03/01/17 - Surgery on 03/02/17  Please schedule a follow up visit with your primary care provider in the next 1-2 weeks   Increase activity slowly    Complete by:  As directed       Signed: Neva Seat, MD 02/20/2017, 8:13 PM   Pager: [phone]

## 2017-02-11 NOTE — Progress Notes (Signed)
Pt labias are swollen, per pt the swelling was on one side but now on both sides, per pt walking makes swelling worse, pt has been  Encourage to do what she thinks makes the swelling better and hold on to walking in the hall way till she talks to attending physician in the morning will continue to monitor

## 2017-02-12 DIAGNOSIS — E876 Hypokalemia: Secondary | ICD-10-CM

## 2017-02-12 LAB — CBC
HCT: 31 % — ABNORMAL LOW (ref 36.0–46.0)
HEMOGLOBIN: 10.2 g/dL — AB (ref 12.0–15.0)
MCH: 29.2 pg (ref 26.0–34.0)
MCHC: 32.9 g/dL (ref 30.0–36.0)
MCV: 88.8 fL (ref 78.0–100.0)
PLATELETS: 473 10*3/uL — AB (ref 150–400)
RBC: 3.49 MIL/uL — AB (ref 3.87–5.11)
RDW: 13.7 % (ref 11.5–15.5)
WBC: 17.7 10*3/uL — AB (ref 4.0–10.5)

## 2017-02-12 LAB — COMPREHENSIVE METABOLIC PANEL
ALK PHOS: 83 U/L (ref 38–126)
ALT: 119 U/L — AB (ref 14–54)
AST: 76 U/L — AB (ref 15–41)
Albumin: 2.1 g/dL — ABNORMAL LOW (ref 3.5–5.0)
Anion gap: 13 (ref 5–15)
BUN: 5 mg/dL — AB (ref 6–20)
CALCIUM: 8 mg/dL — AB (ref 8.9–10.3)
CHLORIDE: 99 mmol/L — AB (ref 101–111)
CO2: 22 mmol/L (ref 22–32)
CREATININE: 0.72 mg/dL (ref 0.44–1.00)
GFR calc non Af Amer: >60 mL/min (ref >60)
Glucose, Bld: 89 mg/dL (ref 65–99)
Potassium: 3.2 mmol/L — ABNORMAL LOW (ref 3.5–5.1)
SODIUM: 134 mmol/L — AB (ref 135–145)
Total Bilirubin: 1.3 mg/dL — ABNORMAL HIGH (ref 0.3–1.2)
Total Protein: 5.5 g/dL — ABNORMAL LOW (ref 6.5–8.1)

## 2017-02-12 LAB — LIPASE, BLOOD: Lipase: 45 U/L (ref 11–51)

## 2017-02-12 MED ORDER — POTASSIUM CHLORIDE CRYS ER 20 MEQ PO TBCR
40.0000 meq | EXTENDED_RELEASE_TABLET | Freq: Once | ORAL | Status: AC
  Administered 2017-02-12: 40 meq via ORAL
  Filled 2017-02-12: qty 2

## 2017-02-12 MED ORDER — MINERAL OIL RE ENEM
1.0000 | ENEMA | Freq: Once | RECTAL | Status: AC
  Administered 2017-02-12: 1 via RECTAL
  Filled 2017-02-12: qty 1

## 2017-02-12 MED ORDER — POTASSIUM CHLORIDE 10 MEQ/100ML IV SOLN
10.0000 meq | INTRAVENOUS | Status: DC
  Administered 2017-02-12: 10 meq via INTRAVENOUS
  Filled 2017-02-12 (×6): qty 100

## 2017-02-12 MED ORDER — FLEET ENEMA 7-19 GM/118ML RE ENEM
1.0000 | ENEMA | Freq: Once | RECTAL | Status: DC
  Filled 2017-02-12: qty 1

## 2017-02-12 MED ORDER — SODIUM CHLORIDE 0.9 % IV SOLN
INTRAVENOUS | Status: DC
  Administered 2017-02-12: 09:00:00 via INTRAVENOUS

## 2017-02-12 NOTE — Progress Notes (Signed)
Pharmacy student rounding with the [employer]. On rounds patient was started on potassium chloride . Patient has not been eating solid foods per their reporting since 7-SEP-18. Patient is taking liquids per their reporting.  To minimize GI irritation, the patient should take potassium chloride 40 mEq tablets with meals and a full glass of water.  Jacqualine Code PharmD Candidate [id number]

## 2017-02-12 NOTE — Progress Notes (Signed)
Daily Rounding Note  02/12/2017, 9:40 AM  LOS: 9 days   SUBJECTIVE:   Chief complaint: back and lower abdominal pain.  These are better.  No N/V.  Not much flatus.  The pain in LLQ is similar to what she experiences at home with her IBS when she is constipated.  Asking about getting an enema.  Tolerating clear liquids but feels like she wants cream of wheat.     No use of prn, using Tylenol for fevers. Fever to 100.4 max yesterday but was up to 102.2, 101.9 on Friday/Saturday.    OBJECTIVE:         Vital signs in last 24 hours:    Temp:  [98.7 F (37.1 C)-100.4 F (38 C)] 99.1 F (37.3 C) (09/17 0542) Pulse Rate:  [98-106] 98 (09/17 0542) Resp:  [18] 18 (09/17 0542) BP: (113-123)/(60-67) 119/65 (09/17 0542) SpO2:  [80 %-100 %] 100 % (09/17 0542) Last BM Date: 02/11/17 Filed Weights   02/04/17 0600 02/06/17 1217  Weight: 59 kg (130 lb 1.6 oz) 59 kg (130 lb)   General: comfortable.  Looks better   Heart: RRR Chest: diminished BS on right, no adventitious sounds, no COB, no cough Abdomen: soft, mild tenderness diffusely, > in RUQ and LLQ.    Extremities: no CCE.  Anasarca in limbs improved, persists in lumbo-sacral area and labia.   Neuro/Psych:  Fully alert and oriented x 3.    Intake/Output from previous day: 09/16 0701 - 09/17 0700 In: 837.5 [P.O.:240; I.V.:597.5] Out: 2831 [Urine:3750; Emesis/NG output:900; Stool:1]  Intake/Output this shift: Total I/O In: -  Out: 201 [Urine:200; Stool:1]  Lab Results:  Recent Labs  02/10/17 0720 02/11/17 0525 02/12/17 0330  WBC 21.8* 19.8* 17.7*  HGB 10.9* 10.7* 10.2*  HCT 33.6* 31.8* 31.0*  PLT 359 355 473*   BMET  Recent Labs  02/10/17 0720 02/11/17 0525 02/12/17 0330  NA 131* 132* 134*  K 3.8 3.5 3.2*  CL 97* 101 99*  CO2 _0 GLUCOSE 88 89 89  BUN 8 5* 5*  CREATININE 0.67 0.61 0.72  CALCIUM 7.7* 7.5* 8.0*   LFT  Recent Labs  02/10/17 0720  02/11/17 0525 02/12/17 0330  PROT 5.3* 4.7* 5.5*  ALBUMIN 2.2* 1.8* 2.1*  AST 24 44* 76*  ALT 137* 106* 119*  ALKPHOS 71 67 83  BILITOT 1.7* 1.7* 1.3*   PT/INR No results for input(s): LABPROT, INR in the last 72 hours. Hepatitis Panel No results for input(s): HEPBSAG, HCVAB, HEPAIGM, HEPBIGM in the last 72 hours.  Studies/Results: Dg Abd Portable 1v  Result Date: 02/10/2017 CLINICAL DATA:  Evaluate ileus. Recent gallbladder surgery, pancreatitis, and ERCP. EXAM: PORTABLE ABDOMEN - 1 VIEW COMPARISON:  February 07, 2017 FINDINGS: Air-filled colon is identified along its entire length with gas in the rectum. No small bowel dilatation. Cholecystectomy clips. No other acute abnormalities. IMPRESSION: Normal bowel gas pattern with no definitive ileus. There is gas throughout the nondistended colon from the cecum through the rectum. Electronically Signed   By: Dorise Bullion III M.D   On: 02/10/2017 17:28   Scheduled Meds: . enoxaparin (LOVENOX) injection  40 mg Subcutaneous Q24H  . famotidine  10 mg Oral BID  . polyethylene glycol  17 g Oral Daily   Continuous Infusions: . sodium chloride 75 mL/hr at 02/12/17 0925  . methocarbamol (ROBAXIN)  IV Stopped (02/08/17 1146)  . potassium chloride 10 mEq (02/12/17 0926)  PRN Meds:.acetaminophen, [COMPLETED] bisacodyl **FOLLOWED BY** bisacodyl, fentaNYL (SUBLIMAZE) injection, methocarbamol (ROBAXIN)  IV, metoCLOPramide (REGLAN) injection, ondansetron (ZOFRAN) IV, promethazine, senna-docusate, simethicone, traMADol  ASSESMENT:   *  Post ERCP pancreatitis.   3rd spacing fluid.  LR running @ 75/hour.  WBCs improved 25 >> 21/8 >> 19.8 >> 17.7.  Lipase normal as of this AM.    *  Choledocholithiasis.  02/06/17 ERCP with sphinct, stone extraction. No remaining stones at final cholangiogram. Indocin PR given post ERCP.  After recent downward trend, Alk phos, AST/ALT on slight rise in last 2 days, T bili steadily better.     *  Cholecystitis.   Cholelithiasis.  02/05/17 lap chole, CBD stones on IOC.    *  Hypokalemia.   Hyponatremia, improved.  131 >> 132 >> 134.    *  Microscopic hematuria 9/16, no evidence UTI. She is menstruating.       PLAN   *  CMET, CBC in AM.  Fleet's enema per pt request.  Advance to full liquids.    Sierra Hernandez  02/12/2017, 9:40 AM Pager: [phone]  GI ATTENDING  Interval history data reviewed. Patient personally seen and examined. Mother in room. Agree with interval progress note. Overall she looks and feels better. Less febrile. All laboratory parameters continue to improve except for hypokalemia which is being repleted and slightly worsening liver tests which are likely reactive to pancreatitis and not unusual. Continue with overall supportive care. Advanced diet as tolerated. Ambulate. We will continue to follow closely.  Docia Chuck. Geri Seminole., M.D. [facility] Division of Gastroenterology

## 2017-02-12 NOTE — Progress Notes (Signed)
   Subjective: Labial swelling improving, but still present. Lower extremity swelling improved. She is experiencing LLQ pain this morning, which she states feels like the pain she get with her constipation due to IBS, and is requesting an enema. She also complains of fluid at her back area, which she attributes to laying with her legs elevated and that area being dependent. Fevers improving, 24hr Tmax of 100.4. She denied any nausea or vomiting. Continues to endor  Objective:  Vital signs in last 24 hours: Vitals:   02/11/17 1647 02/11/17 2147 02/11/17 2347 02/12/17 0542  BP: 113/60 123/67  119/65  Pulse: (!) 103 (!) 106  98  Resp:  18  18  Temp: 98.8 F (37.1 C) (!) 100.4 F (38 C) 99.1 F (37.3 C) 99.1 F (37.3 C)  TempSrc: Oral Oral Oral Oral  SpO2: 100% (!) 80%  100%  Weight:      Height:       Gen. Well-developed, well-nourished, in bed, in no acute distress. Extremities. Improving labial edema R>L, no erythema or tenderness, Trace Bilat LE Edema, Pulses intact and symmetrical. Abdomen. Mildly Distended with tenderness to palpation at all quadrants, NABS. Chest. Clear bilaterally. CV. Regular rate and rhythm.  Assessment/Plan:  Choledocholithiasis, cholecystitis, Post-ERCP Pancreatitis, Post-operative Ileus . Her pancreatitis seems improving, bowel sounds normactive and having liquid bowl movements. Swelling of her genitalia and legs has improved. KUB 9/15 showed normal bowl gas pattern without signs of illeus. Per GI, plan for CT Abdomen if fever persists 1 week post ERCP (9/18). No significant fever since 101.4 on morning of 9/16. Bladder pressure not checked due to being unable to on the floor. UA was unremarkable (did show large HGB, but patient is on her period). WBC downtrending, 17.7 today. Lipase 45 on 9/17. - We appreciate surgery and GI recommendations. - Encourage PO intake - Diet full liquid, Advance as tolerated - Continue pain management as needed. - Obtain  blood culture for fever over 101. - Continue MiraLAX. - Mineral Oil Enema  - AM CMP and CBC  Hypokalemia: K 3.2 this morning. Initially attempted to replete IV, but patient experienced burning and became able to tolerate PO so was switched to PO. Patient did receive 1 run of IV potassium. - Potassium  PO  Transaminitis. Improving, alkaline phosphatase has been normalized, AST (breifly normalized), ALT and bilirubin mildly elevated.  Dispo: Anticipated discharge in approximately 1-3 day(s).   Beola Cord, MD 02/12/2017, 2:47 PM Pager: [phone]

## 2017-02-12 NOTE — Progress Notes (Signed)
Patient ID: Sierra Hernandez, female   DOB: 1991-05-13, 26 y.o.   MRN: [id number]  [facility] Surgery Progress Note  6 Days Post-Op  Subjective: CC- abdominal pain Patient continues to have mostly LLQ and lower abdominal pain. States that this is typically where she has pain when she is constipated. She is having loose BM's every other time she goes to the bathroom. Passing small amount of flatus. Requesting enema. No n/v in 24 hours. Tolerating clear liquids. Feels like she could tolerate more than clears to eat. Did not ambulate as much yesterday due to labia and BLE swelling. Spent most of the day in bed. Feels as though edema slightly improved today. Denies SOB.  Objective: Vital signs in last 24 hours: Temp:  [98.7 F (37.1 C)-100.4 F (38 C)] 99.1 F (37.3 C) (09/17 0542) Pulse Rate:  [98-106] 98 (09/17 0542) Resp:  [18] 18 (09/17 0542) BP: (113-123)/(60-67) 119/65 (09/17 0542) SpO2:  [80 %-100 %] 100 % (09/17 0542) Last BM Date: 02/11/17  Intake/Output from previous day: 09/16 0701 - 09/17 0700 In: 837.5 [P.O.:240; I.V.:597.5] Out: 4651 [Urine:3750; Emesis/NG output:900; Stool:1] Intake/Output this shift: Total I/O In: -  Out: 201 [Urine:200; Stool:1]  PE: Gen: Alert, NAD HEENT: EOM's intact, pupils equal and round Cardio: RRR Pulm: CTAB with diminished sounds in bilateral bases, effort normal Cardio: RRR Abd: Soft, distended, few BS, incisions C/D/I, mild global tenderness with most significant tenderness in LLQ and LUQ. Edema noted to lower back. GU: edema slightly improved in labia Skin: no rashes noted, warm and dry Ext: edema noted to BUE/BLE >> improving  Lab Results:   Recent Labs  02/11/17 0525 02/12/17 0330  WBC 19.8* 17.7*  HGB 10.7* 10.2*  HCT 31.8* 31.0*  PLT 355 473*   BMET  Recent Labs  02/11/17 0525 02/12/17 0330  NA 132* 134*  K 3.5 3.2*  CL 101 99*  CO2 27 22  GLUCOSE 89 89  BUN 5* 5*  CREATININE 0.61 0.72  CALCIUM 7.5*  8.0*   PT/INR No results for input(s): LABPROT, INR in the last 72 hours. CMP     Component Value Date/Time   NA 134 (L) 02/12/2017 0330   K 3.2 (L) 02/12/2017 0330   CL 99 (L) 02/12/2017 0330   CO2 22 02/12/2017 0330   GLUCOSE 89 02/12/2017 0330   BUN 5 (L) 02/12/2017 0330   CREATININE 0.72 02/12/2017 0330   CALCIUM 8.0 (L) 02/12/2017 0330   PROT 5.5 (L) 02/12/2017 0330   ALBUMIN 2.1 (L) 02/12/2017 0330   AST 76 (H) 02/12/2017 0330   ALT 119 (H) 02/12/2017 0330   ALKPHOS 83 02/12/2017 0330   BILITOT 1.3 (H) 02/12/2017 0330   GFRNONAA >60 02/12/2017 0330   GFRAA >60 02/12/2017 0330   Lipase     Component Value Date/Time   LIPASE 45 02/12/2017 0330       Studies/Results: Dg Abd Portable 1v  Result Date: 02/10/2017 CLINICAL DATA:  Evaluate ileus. Recent gallbladder surgery, pancreatitis, and ERCP. EXAM: PORTABLE ABDOMEN - 1 VIEW COMPARISON:  February 07, 2017 FINDINGS: Air-filled colon is identified along its entire length with gas in the rectum. No small bowel dilatation. Cholecystectomy clips. No other acute abnormalities. IMPRESSION: Normal bowel gas pattern with no definitive ileus. There is gas throughout the nondistended colon from the cecum through the rectum. Electronically Signed   By: Gerome Sam III M.D   On: 02/10/2017 17:28    Anti-infectives: Anti-infectives    Start  Dose/Rate Route Frequency Ordered Stop   02/05/17 0830  ceFAZolin (ANCEF) IVPB 2g/100 mL premix     2 g 200 mL/hr over 30 Minutes Intravenous On call to O.R. 02/05/17 0981 02/05/17 0959   02/03/17 2200  metroNIDAZOLE (FLAGYL) IVPB 500 mg  Status:  Discontinued     500 mg 100 mL/hr over 60 Minutes Intravenous Every 8 hours 02/03/17 2127 02/07/17 0603   02/03/17 2200  cefTRIAXone (ROCEPHIN) 2 g in dextrose 5 % 50 mL IVPB  Status:  Discontinued     2 g 100 mL/hr over 30 Minutes Intravenous Every 24 hours 02/03/17 2127 02/07/17 0603       Assessment/Plan IBS - takes miralax almost  daily at home  Calculus of bile duct with acute cholecystitis without mention of obstruction S/p lap c7ole 9/10 Dr. Luisa Hart - POD 6 - s/p ERCP 9/11 - Post-ERCP pancreatitis - lipase WNL - Transaminases slightly up, bilirubin trending down 1.3 - WBC slightly down 17.7 from 19.8. TMAX 100.4  ID - rocephin/flagyl 9/8>>9/12 FEN - IVF, FLD VTE - SCDs, lovenox Foley - none Follow up - 2-3 weeks DOW clinic  Plan - Lipase WNL, WBC trending down. TMAX down 100.4.  Diet advanced to fulls by GI. Patient requesting another enema, will give fleets. Hypokalemia addressed by medicine team (ok to switch to PO potassium from our standpoint).  Encouraged patient to spend more time OOB today. Alternate ambulating and elevating for edema. Labs in AM.   LOS: 9 days    Franne Forts , [facility] Surgery 02/12/2017, 10:20 AM Pager: [phone] Consults: [phone] Mon-Fri 7:00 am-4:30 pm Sat-Sun 7:00 am-11:30 am

## 2017-02-12 NOTE — Progress Notes (Signed)
Pt's temp is 101.0. MD made aware. Blood cultures ordered and tylenol given. Will continue to assess.

## 2017-02-13 ENCOUNTER — Inpatient Hospital Stay (HOSPITAL_COMMUNITY): Payer: Managed Care, Other (non HMO)

## 2017-02-13 DIAGNOSIS — R509 Fever, unspecified: Secondary | ICD-10-CM

## 2017-02-13 DIAGNOSIS — R633 Feeding difficulties: Secondary | ICD-10-CM

## 2017-02-13 LAB — COMPREHENSIVE METABOLIC PANEL
ALT: 220 U/L — ABNORMAL HIGH (ref 14–54)
ANION GAP: 9 (ref 5–15)
AST: 174 U/L — ABNORMAL HIGH (ref 15–41)
Albumin: 2 g/dL — ABNORMAL LOW (ref 3.5–5.0)
Alkaline Phosphatase: 80 U/L (ref 38–126)
BUN: <5 mg/dL — ABNORMAL LOW (ref 6–20)
CHLORIDE: 99 mmol/L — AB (ref 101–111)
CO2: 26 mmol/L (ref 22–32)
Calcium: 7.8 mg/dL — ABNORMAL LOW (ref 8.9–10.3)
Creatinine, Ser: 0.61 mg/dL (ref 0.44–1.00)
GFR calc non Af Amer: >60 mL/min (ref >60)
Glucose, Bld: 111 mg/dL — ABNORMAL HIGH (ref 65–99)
POTASSIUM: 3.1 mmol/L — AB (ref 3.5–5.1)
SODIUM: 134 mmol/L — AB (ref 135–145)
Total Bilirubin: 1.3 mg/dL — ABNORMAL HIGH (ref 0.3–1.2)
Total Protein: 5.1 g/dL — ABNORMAL LOW (ref 6.5–8.1)

## 2017-02-13 LAB — CBC
HCT: 31.3 % — ABNORMAL LOW (ref 36.0–46.0)
Hemoglobin: 10.4 g/dL — ABNORMAL LOW (ref 12.0–15.0)
MCH: 29.2 pg (ref 26.0–34.0)
MCHC: 33.2 g/dL (ref 30.0–36.0)
MCV: 87.9 fL (ref 78.0–100.0)
PLATELETS: 569 10*3/uL — AB (ref 150–400)
RBC: 3.56 MIL/uL — ABNORMAL LOW (ref 3.87–5.11)
RDW: 13.2 % (ref 11.5–15.5)
WBC: 18.5 10*3/uL — ABNORMAL HIGH (ref 4.0–10.5)

## 2017-02-13 LAB — PHOSPHORUS: PHOSPHORUS: 4.1 mg/dL (ref 2.5–4.6)

## 2017-02-13 LAB — MAGNESIUM: MAGNESIUM: 1.9 mg/dL (ref 1.7–2.4)

## 2017-02-13 MED ORDER — POTASSIUM CHLORIDE 10 MEQ/100ML IV SOLN
10.0000 meq | INTRAVENOUS | Status: AC
  Administered 2017-02-13 (×5): 10 meq via INTRAVENOUS
  Filled 2017-02-13 (×5): qty 100

## 2017-02-13 MED ORDER — POTASSIUM CHLORIDE 10 MEQ/100ML IV SOLN
10.0000 meq | INTRAVENOUS | Status: DC
  Administered 2017-02-13: 10 meq via INTRAVENOUS
  Filled 2017-02-13: qty 100

## 2017-02-13 MED ORDER — IOPAMIDOL (ISOVUE-300) INJECTION 61%
INTRAVENOUS | Status: AC
  Administered 2017-02-13: 30 mL via ORAL
  Filled 2017-02-13: qty 30

## 2017-02-13 MED ORDER — IOPAMIDOL (ISOVUE-300) INJECTION 61%
INTRAVENOUS | Status: AC
  Administered 2017-02-13: 100 mL
  Filled 2017-02-13: qty 100

## 2017-02-13 MED ORDER — POTASSIUM CHLORIDE 10 MEQ/100ML IV SOLN: 10.0000 meq | INTRAVENOUS | Status: DC

## 2017-02-13 MED ORDER — SODIUM CHLORIDE 0.9 % IV SOLN
INTRAVENOUS | Status: AC
  Administered 2017-02-13: 10:00:00 via INTRAVENOUS

## 2017-02-13 MED ORDER — MEROPENEM 1 G IV SOLR
1.0000 g | Freq: Three times a day (TID) | INTRAVENOUS | Status: DC
  Administered 2017-02-13 – 2017-02-16 (×9): 1 g via INTRAVENOUS
  Filled 2017-02-13 (×11): qty 1

## 2017-02-13 NOTE — Progress Notes (Signed)
Subjective Not tolerating much liquids. No n/v. C/o back discomfort - can't get comfortable. Abdomen feels distended to her  Objective: Vital signs in last 24 hours: Temp:  [98.2 F (36.8 C)-101 F (38.3 C)] 99.5 F (37.5 C) (09/18 0503) Pulse Rate:  [87-107] 87 (09/18 0503) Resp:  [17-22] 17 (09/18 0503) BP: (114-131)/(61-69) 128/69 (09/18 0503) SpO2:  [100 %] 100 % (09/18 0503) Last BM Date: 02/12/17  Intake/Output from previous day: 09/17 0701 - 09/18 0700 In: 675 [P.O.:620; IV Piggyback:55] Out: 3303 [Urine:3300; Stool:3] Intake/Output this shift: Total I/O In: -  Out: 400 [Urine:400]  Gen: NAD, comfortable CV: RRR Pulm: Normal work of breathing Abd: Incisions well healed. Abd soft, mildly ttp throughout, no r/g; moderately distended. Ext: SCDs in place  Lab Results: CBC   Recent Labs  02/12/17 0330 02/13/17 0426  WBC 17.7* 18.5*  HGB 10.2* 10.4*  HCT 31.0* 31.3*  PLT 473* 569*   BMET  Recent Labs  02/12/17 0330 02/13/17 0426  NA 134* 134*  K 3.2* 3.1*  CL 99* 99*  CO2 22 26  GLUCOSE 89 111*  BUN 5* <5*  CREATININE 0.72 0.61  CALCIUM 8.0* 7.8*    Anti-infectives: Anti-infectives    Start     Dose/Rate Route Frequency Ordered Stop   02/05/17 0830  ceFAZolin (ANCEF) IVPB 2g/100 mL premix     2 g 200 mL/hr over 30 Minutes Intravenous On call to O.R. 02/05/17 4098 02/05/17 0959   02/03/17 2200  metroNIDAZOLE (FLAGYL) IVPB 500 mg  Status:  Discontinued     500 mg 100 mL/hr over 60 Minutes Intravenous Every 8 hours 02/03/17 2127 02/07/17 0603   02/03/17 2200  cefTRIAXone (ROCEPHIN) 2 g in dextrose 5 % 50 mL IVPB  Status:  Discontinued     2 g 100 mL/hr over 30 Minutes Intravenous Every 24 hours 02/03/17 2127 02/07/17 0603       Assessment/Plan: Patient Active Problem List   Diagnosis Date Noted  . Acute pancreatitis 02/08/2017  . Cholelithiasis with choledocholithiasis    s/p Procedure(s): Lap ccy c +IOC 9/10; ERCP 9/11 with subsequent  pancreatitis  -Intermittent fevers, WBC trending up, abd distended - ordered CT A/P with PO & IV contrast (ordered) -NPO, IVF; consider TPN at this point as she's not reliably tolerating PO and is distended today -Await results of scan   LOS: 10 days   Andria Meuse, MD [facility]

## 2017-02-13 NOTE — Progress Notes (Signed)
GI ATTENDING  CT scan reviewed. Pancreas looks fine. Significant intra-abdominal and pelvic fluid. May be third space fluid from hydration though the concern of postoperative bile duct leak raised. There is somewhat of an ileus pattern as well.  PLAN: 1. Stat hepatobiliary scan "rule out bile leak". If positive, will need percutaneous drainage. She already has recent sphincterotomy which is the equivalent of biliarystent placement 2. Continue antibiotics as prescribed today 3. Recommend TPN over enteral feeding given ileus 4. The above to be communicated directly to Gen. Surgery, internal medicine, and the patient/family by my GI PA  [name]. Eda Keys., M.D. [facility] Division of Gastroenterology

## 2017-02-13 NOTE — Progress Notes (Signed)
Daily Rounding Note  02/13/2017, 9:37 AM  LOS: 10 days   SUBJECTIVE:   Chief complaint: pain in lower back persists and is bigger issue than lower abdominal pain.  Abdomen feels distended.   Not reliably tolerating po.  No breathing troubles.   No stool or sense of relief of distention after fleets enema yesterday.      Recurrent fevers yesterday PM. Blood clx and CT ab/pelvix orders.  Surgeon, Dr Cliffton Asters wondering about TPN.   I/O: negative 2.6 liters.    OBJECTIVE:         Vital signs in last 24 hours:    Temp:  [98.2 F (36.8 C)-101 F (38.3 C)] 99.5 F (37.5 C) (09/18 0503) Pulse Rate:  [87-107] 87 (09/18 0503) Resp:  [17-22] 17 (09/18 0503) BP: (114-131)/(61-69) 128/69 (09/18 0503) SpO2:  [100 %] 100 % (09/18 0503) Last BM Date: 02/12/17 Filed Weights   02/04/17 0600 02/06/17 1217  Weight: 59 kg (130 lb 1.6 oz) 59 kg (130 lb)   General: uncomfortable   Heart: RRR, not tachy Chest: clear bil.  No cough or labored breathing.   Abdomen: soft, ND.  Scant BS but none tinkling or tympanitic.  Tender without guard or rebound in lower abdomen  Extremities: edema in legs hardly noticeable, persists in lower back and labia.  Neuro/Psych:  Oriented x 3.  Anxious at times.  No limb weakness or tremor.   Intake/Output from previous day: 09/17 0701 - 09/18 0700 In: 675 [P.O.:620; IV Piggyback:55] Out: 3303 [Urine:3300; Stool:3]  Intake/Output this shift: Total I/O In: -  Out: 400 [Urine:400]  Lab Results:  Recent Labs  02/11/17 0525 02/12/17 0330 02/13/17 0426  WBC 19.8* 17.7* 18.5*  HGB 10.7* 10.2* 10.4*  HCT 31.8* 31.0* 31.3*  PLT 355 473* 569*   BMET  Recent Labs  02/11/17 0525 02/12/17 0330 02/13/17 0426  NA 132* 134* 134*  K 3.5 3.2* 3.1*  CL 101 99* 99*  CO2 GLUCOSE 89 89 111*  BUN 5* 5* <5*  CREATININE 0.61 0.72 0.61  CALCIUM 7.5* 8.0* 7.8*   LFT  Recent Labs  02/11/17 0525  02/12/17 0330 02/13/17 0426  PROT 4.7* 5.5* 5.1*  ALBUMIN 1.8* 2.1* 2.0*  AST 44* 76* 174*  ALT 106* 119* 220*  ALKPHOS 67 83 80  BILITOT 1.7* 1.3* 1.3*   PT/INR No results for input(s): LABPROT, INR in the last 72 hours. Hepatitis Panel No results for input(s): HEPBSAG, HCVAB, HEPAIGM, HEPBIGM in the last 72 hours.  Studies/Results: No results found.   Scheduled Meds: . iopamidol      . enoxaparin (LOVENOX) injection  40 mg Subcutaneous Q24H  . famotidine  10 mg Oral BID  . polyethylene glycol  17 g Oral Daily  . sodium phosphate  1 enema Rectal Once   Continuous Infusions: . sodium chloride    . methocarbamol (ROBAXIN)  IV 500 mg (02/13/17 0259)  . potassium chloride     PRN Meds:.acetaminophen, [COMPLETED] bisacodyl **FOLLOWED BY** bisacodyl, fentaNYL (SUBLIMAZE) injection, methocarbamol (ROBAXIN)  IV, metoCLOPramide (REGLAN) injection, ondansetron (ZOFRAN) IV, promethazine, senna-docusate, simethicone, traMADol   ASSESMENT:   *  Post ERCP pancreatitis, not making much improvement.  Fevers continue.  Transaminases trending up.  WBCs trending up, though down from max of 25 K four days ago.  *  Hypokalemia.  Got multiple runs of potassium yesterday, more ordered today.       PLAN   *  Consider adding Potassium to her NS IVF *  CT abdomen and pelvis today.   *  Meropenim to be initiated by teaching service.   *  Rather than TPN, consider cortrac (post pyloric if possible) feeding tube.    Jennye Moccasin  02/13/2017, 9:37 AM Pager: [phone]  GI ATTENDING  Interval history data reviewed. Patient personally seen and examined. Parents in room. Internal medicine team in room. Issue since yesterday include interval fever, slightly rising white blood cell count and liver tests, and complaints of worsening back pain. Very little oral intake. Discussed following recommendations with the patient, family, and internal medicine team: 1. Contrast-enhanced CT scan of the abdomen  and pelvis to rule out necrosis or other problem. Primary team ordered 2. Initiate meropenem. Primary team to order 3. Discussed nutritional options at this time. Suspect she is a candidate for enteral feeding as She has good bowel sounds. This is preferred. Otherwise TPN 4. Replete potassium. Primary team to address  [name]. Eda Keys., M.D. [facility] Division of Gastroenterology

## 2017-02-13 NOTE — Progress Notes (Signed)
Abdominal CT results noted - concerned for bile leak with multiple intra-abdominal fluid collections, likely dependent collections from bile leak. Spoke with Jennye Moccasin, PA-C w/ GI who ordered a HIDA scan. I have placed order for percutaneous drain placement, held Lovenox, NPO after MN. Will contact IR.    Hosie Spangle, PA-C [facility] Pager: [phone] Consults: [phone] Mon-Fri 7:00 am-4:30 pm Sat-Sun 7:00 am-11:30 am

## 2017-02-13 NOTE — Progress Notes (Signed)
   Subjective: Labial swelling improving, but still present. Lower extremity remains improved. She is experiencing inceased abdominal pain and distention. She complains of back pain and continues to have loose stools. Tmax of 101 in 24hrs. She denied any nausea or vomiting.   Objective:  Vital signs in last 24 hours: Vitals:   02/13/17 0503 02/13/17 1316 02/13/17 1411 02/13/17 1413  BP: 128/69 118/73  126/82  Pulse: 87 97 94 94  Resp: 17 18    Temp: 99.5 F (37.5 C) 99.1 F (37.3 C) 98.6 F (37 C) 99 F (37.2 C)  TempSrc: Oral Oral Oral   SpO2: 100% 100% 100% 100%  Weight:      Height:       Gen. Well-developed, well-nourished, in bed, in no acute distress. Extremities. Improving labial edema R>L, no erythema or tenderness, Pulses intact and symmetrical. Abdomen. Distended with tenderness to palpation at all quadrants, Hypoactive Bowl sounds. Chest. Clear bilaterally. CV. Regular rate and rhythm.  Assessment/Plan:  Choledocholithiasis, cholecystitis, Post-ERCP Pancreatitis, Post-operative Ileus . Her pancreatitis seems improving, bowel sounds normactive and having liquid bowl movements. Swelling of her genitalia and legs has improved. KUB 9/15 showed normal bowl gas pattern without signs of illeus. Fever of 101 on 9/17, blood cultures pending. WBC up to 18.5 on 9/18 from 17.7 1 day priot. Lipase 45 on 9/17. Able to eat only very minimal of her diet. CT Abd/Pelvis 9/18 showed diffuse abdominal and pelvic ascites with scattered interloop abdominopelvic fluid collections and diffuse peritoneal and mesenteric enhancement compatible with postoperative peritonitis possibly related to a bile leak. CT also showed SB Dilation favoring ileus.  - We appreciate surgery and GI recommendations. - NM Hepatobiliary Scan - TPN considering finding of ileus on CT - Continue pain management as needed. - Meropenem per pharmacy for suspected intra-abdominal infection - Continue MiraLAX. - AM CMP and  CBC  Hypokalemia: K 3.1 on 9/18 despite 1 run of IV potassium and PO. Mg 1.9. - KCl over 2hrs, for 5 doses  Transaminitis. Alkaline phosphatase normalized, AST (breifly normalized) and ALT elevated. Bilirubin mildly elevated.  Dispo: Anticipated discharge in approximately 2-5 day(s).   Beola Cord, MD 02/13/2017, 2:28 PM Pager: [phone]

## 2017-02-13 NOTE — Progress Notes (Addendum)
Pharmacy Antibiotic Note  Sierra Hernandez is a 26 y.o. female admitted on 02/03/2017 with worsening RUQ pain.  Pt is s/p lap-chole 9/10, ERCP 9/11, with post-procedure pancreatitis.  Abd CT today showe multiple intra-abd fluid collections and concern for bile leak.  Pharmacy has been consulted for Meropenem dosing.  Plan: Meropenem 1gm IV q8h. Follow-up culture data, clinical progress, and antibiotic plan.  Height:  (162.6 cm) Weight: 130 lb (59 kg) IBW/kg (Calculated) : 54.7  Temp (24hrs), Avg:99.4 F (37.4 C), Min:98.2 F (36.8 C), Max:101 F (38.3 C)   Recent Labs Lab 02/09/17 0404 02/10/17 0720 02/11/17 0525 02/12/17 0330 02/13/17 0426  WBC 25.0* 21.8* 19.8* 17.7* 18.5*  CREATININE 0.74 0.67 0.61 0.72 0.61    Estimated Creatinine Clearance: 93.6 mL/min (by C-G formula based on SCr of 0.61 mg/dL).    Allergies  Allergen Reactions  . Minocycline Swelling and Other (See Comments)    Joint pain and swelling, exhaustion    Antimicrobials this admission: Merrem 9/18  >>     Dose adjustments this admission:   Microbiology results: 9/17 BCx: ngtd  Thank you for allowing pharmacy to be a part of this patient's care.  [employer], Pharm.D., BCPS Clinical Pharmacist Pager: [phone] Clinical phone for 02/13/2017 from 8:30-4:00 is [phone]. After 4pm, please call Main Rx [phone]) for assistance. 02/13/2017 4:04 PM

## 2017-02-14 ENCOUNTER — Inpatient Hospital Stay (HOSPITAL_COMMUNITY): Payer: Managed Care, Other (non HMO)

## 2017-02-14 DIAGNOSIS — N9089 Other specified noninflammatory disorders of vulva and perineum: Secondary | ICD-10-CM

## 2017-02-14 DIAGNOSIS — R188 Other ascites: Secondary | ICD-10-CM

## 2017-02-14 DIAGNOSIS — R140 Abdominal distension (gaseous): Secondary | ICD-10-CM

## 2017-02-14 DIAGNOSIS — E43 Unspecified severe protein-calorie malnutrition: Secondary | ICD-10-CM

## 2017-02-14 DIAGNOSIS — K567 Ileus, unspecified: Secondary | ICD-10-CM

## 2017-02-14 LAB — CBC
HCT: 32.3 % — ABNORMAL LOW (ref 36.0–46.0)
Hemoglobin: 10.7 g/dL — ABNORMAL LOW (ref 12.0–15.0)
MCH: 29 pg (ref 26.0–34.0)
MCHC: 33.1 g/dL (ref 30.0–36.0)
MCV: 87.5 fL (ref 78.0–100.0)
PLATELETS: 637 10*3/uL — AB (ref 150–400)
RBC: 3.69 MIL/uL — AB (ref 3.87–5.11)
RDW: 13.1 % (ref 11.5–15.5)
WBC: 21.9 10*3/uL — AB (ref 4.0–10.5)

## 2017-02-14 LAB — MAGNESIUM: MAGNESIUM: 1.9 mg/dL (ref 1.7–2.4)

## 2017-02-14 LAB — TRIGLYCERIDES: Triglycerides: 152 mg/dL — ABNORMAL HIGH (ref <150)

## 2017-02-14 LAB — COMPREHENSIVE METABOLIC PANEL
ALK PHOS: 94 U/L (ref 38–126)
ALT: 270 U/L — AB (ref 14–54)
ANION GAP: 10 (ref 5–15)
AST: 213 U/L — ABNORMAL HIGH (ref 15–41)
Albumin: 2.1 g/dL — ABNORMAL LOW (ref 3.5–5.0)
BILIRUBIN TOTAL: 1.2 mg/dL (ref 0.3–1.2)
BUN: <5 mg/dL — ABNORMAL LOW (ref 6–20)
CALCIUM: 8 mg/dL — AB (ref 8.9–10.3)
CO2: 25 mmol/L (ref 22–32)
CREATININE: 0.68 mg/dL (ref 0.44–1.00)
Chloride: 99 mmol/L — ABNORMAL LOW (ref 101–111)
Glucose, Bld: 100 mg/dL — ABNORMAL HIGH (ref 65–99)
Potassium: 4 mmol/L (ref 3.5–5.1)
Sodium: 134 mmol/L — ABNORMAL LOW (ref 135–145)
TOTAL PROTEIN: 5.5 g/dL — AB (ref 6.5–8.1)

## 2017-02-14 LAB — DIFFERENTIAL
BASOS PCT: 0 %
Basophils Absolute: 0 10*3/uL (ref 0.0–0.1)
EOS PCT: 1 %
Eosinophils Absolute: 0.3 10*3/uL (ref 0.0–0.7)
Lymphocytes Relative: 11 %
Lymphs Abs: 2.3 10*3/uL (ref 0.7–4.0)
MONO ABS: 3 10*3/uL — AB (ref 0.1–1.0)
Monocytes Relative: 14 %
NEUTROS PCT: 74 %
Neutro Abs: 16.2 10*3/uL — ABNORMAL HIGH (ref 1.7–7.7)

## 2017-02-14 LAB — PHOSPHORUS: PHOSPHORUS: 4 mg/dL (ref 2.5–4.6)

## 2017-02-14 LAB — PREALBUMIN: PREALBUMIN: 7.3 mg/dL — AB (ref 18–38)

## 2017-02-14 LAB — GLUCOSE, CAPILLARY: GLUCOSE-CAPILLARY: 121 mg/dL — AB (ref 65–99)

## 2017-02-14 MED ORDER — SODIUM CHLORIDE 0.9 % IV SOLN
INTRAVENOUS | Status: AC
  Administered 2017-02-14 – 2017-02-15 (×2): via INTRAVENOUS

## 2017-02-14 MED ORDER — INSULIN ASPART 100 UNIT/ML ~~LOC~~ SOLN
0.0000 [IU] | Freq: Four times a day (QID) | SUBCUTANEOUS | Status: DC
  Administered 2017-02-14 – 2017-02-16 (×7): 1 [IU] via SUBCUTANEOUS

## 2017-02-14 MED ORDER — FAT EMULSION 20 % IV EMUL
120.0000 mL | INTRAVENOUS | Status: AC
  Administered 2017-02-14: 120 mL via INTRAVENOUS
  Filled 2017-02-14: qty 250

## 2017-02-14 MED ORDER — MAGNESIUM SULFATE 2 GM/50ML IV SOLN
2.0000 g | Freq: Once | INTRAVENOUS | Status: AC
Start: 2017-02-14 — End: 2017-02-14
  Administered 2017-02-14: 2 g via INTRAVENOUS
  Filled 2017-02-14: qty 50

## 2017-02-14 MED ORDER — SODIUM CHLORIDE 0.9% FLUSH: 10.0000 mL | Freq: Two times a day (BID) | INTRAVENOUS | Status: DC

## 2017-02-14 MED ORDER — TRACE MINERALS CR-CU-MN-SE-ZN 10-1000-500-60 MCG/ML IV SOLN
INTRAVENOUS | Status: AC
  Administered 2017-02-14: 18:00:00 via INTRAVENOUS
  Filled 2017-02-14: qty 720

## 2017-02-14 MED ORDER — SODIUM CHLORIDE 0.9% FLUSH
10.0000 mL | INTRAVENOUS | Status: DC | PRN
  Administered 2017-02-14: 20 mL
  Administered 2017-02-15: 10 mL
  Administered 2017-02-16: 20 mL
  Administered 2017-02-17 – 2017-02-18 (×2): 10 mL
  Filled 2017-02-14 (×5): qty 40

## 2017-02-14 MED ORDER — ENOXAPARIN SODIUM 40 MG/0.4ML ~~LOC~~ SOLN
40.0000 mg | SUBCUTANEOUS | Status: DC
  Administered 2017-02-14 – 2017-02-18 (×5): 40 mg via SUBCUTANEOUS
  Filled 2017-02-14 (×5): qty 0.4

## 2017-02-14 MED ORDER — TECHNETIUM TC 99M MEBROFENIN IV KIT
5.0000 | PACK | Freq: Once | INTRAVENOUS | Status: AC | PRN
  Administered 2017-02-14: 5 via INTRAVENOUS

## 2017-02-14 NOTE — Progress Notes (Signed)
PHARMACY - ADULT TOTAL PARENTERAL NUTRITION CONSULT NOTE   Pharmacy Consult:  TPN Indication:  Prolonged ileus / Pancreatitis  Patient Measurements: Height: 5' 4" (162.6 cm) Weight: 130 lb (59 kg) IBW/kg (Calculated) : 54.7 TPN AdjBW (KG): 59 Body mass index is 22.31 kg/m.  Assessment:  33 YOF with a history of cholelithiasis and is scheduled for cholecystectomy on 02/07/17; however, her abdominal pain and emesis brought her into the ED on 02/03/17.  Patient underwent cholecystectomy with cholangiogram on 02/05/17 and ERCP on 02/06/17.  Post-operatively patient developed pancreatitis and ileus.  Repeat CT showed intra-abdominal and pelvic fluid collection and possible bile leak.  Pharmacy consulted to manage TPN.  Per patient, she was eating regularly until 3 days PTA and no significant weight loss.  During hospitalization, she has been primarily NPO due to abdominal distension and vomiting.  Patient is at risk for refeeding syndrome.  GI: hx IBS / cholelithiasis.  BL prealbumin low at 7.3.  Pepcid to be added to TPN, Miralax Endo: no hx DM - glucose on BMET controlled Insulin requirements in the past 24 hours: N/A Lytes: all WNL except low Na/CL (PTA to TPN), K+ 4 post 6 runs yesterday Renal: SCr 0.68 stable, BUN WNL - good UOP 3.1 ml/kg/hr, NS at 75 ml/hr Pulm: asthma - stable on RA Cards: no hx - BP controlled, tachy Hepatobil: LFTs trending back up, tbili down to 1.2, alk phos WNL, BL TG slightly elevated at 152, lipase down to WNL Neuro: scoliosis - PRN Robaxin/tramadol ID: [id number] >> Merrem (9/18 >> ) for peritonitis, now afebrile, WBC up 21.9.  BCx pending. Best Practices: Lovenox held TPN Access: discussed with MD - PICC to be placed TPN start date: 02/14/17  Nutritional Goals: 1400-1500 kCal and 70-80 gm protein per day  Current Nutrition:  NPO   Plan:  Initiate Clinimix E 5/15 at 30 ml/hr + 20% ILE at 10 ml/hr x 12 hrs Daily multivitamin and trace elements in TPN D/C BID  Pepcid and add 84m to TPN Start sensitive SSI Q6H.  D/C if CBGs remain controlled at goal TPN rate. Reduce NS to 45 ml/hr when TPN starts Mag sulfate 2gm IV x 1 Standard TPN labs and nursing care orders F/U perc drain placement, HIDA scan, AM labs   Luka Reisch D. DMina Marble PharmD, BCPS Pager:  [phone]/19/2018, 9:22 AM

## 2017-02-14 NOTE — Progress Notes (Signed)
Daily Rounding Note  02/14/2017, 3:03 PM  LOS: 11 days   SUBJECTIVE:   Chief complaint: abdominal pain is better.  No nausea.  Feels weak.     4.2 liters of urine, total I/Os: negative 2 liters.   Pt had PICC placed so she will start TNA today.  OBJECTIVE:         Vital signs in last 24 hours:    Temp:  [99.1 F (37.3 C)-99.6 F (37.6 C)] 99.1 F (37.3 C) (09/19 1338) Pulse Rate:  [99-106] 99 (09/19 1338) Resp:  [18-20] 20 (09/19 1338) BP: (120-135)/(66-83) 120/66 (09/19 1338) SpO2:  [99 %-100 %] 99 % (09/19 1338) Weight:  [55.7 kg (122 lb 12.8 oz)] 55.7 kg (122 lb 12.8 oz) (09/18 1900) Last BM Date: 02/13/17 Filed Weights   02/04/17 0600 02/06/17 1217 02/13/17 1900  Weight: 59 kg (130 lb 1.6 oz) 59 kg (130 lb) 55.7 kg (122 lb 12.8 oz)   General: looks better, thin.   Heart: RRR Chest: clear bil.   Abdomen: soft, distended.  Less tenderness, mostly on right side  Extremities: swelling in legs resolved, in labia swelling improved Neuro/Psych:  Calm, relaxed.  Not distressed as in past few days.    Intake/Output from previous day: 09/18 0701 - 09/19 0700 In: 2195 [I.V.:1540; IV Piggyback:655] Out: 4200 [Urine:4200]  Intake/Output this shift: Total I/O In: -  Out: 1750 [Urine:1750]  Lab Results:  Recent Labs  02/12/17 0330 02/13/17 0426 02/14/17 0421  WBC 17.7* 18.5* 21.9*  HGB 10.2* 10.4* 10.7*  HCT 31.0* 31.3* 32.3*  PLT 473* 569* 637*   BMET  Recent Labs  02/12/17 0330 02/13/17 0426 02/14/17 0421  NA 134* 134* 134*  K 3.2* 3.1* 4.0  CL 99* 99* 99*  CO2 GLUCOSE 89 111* 100*  BUN 5* <5* <5*  CREATININE 0.72 0.61 0.68  CALCIUM 8.0* 7.8* 8.0*   LFT  Recent Labs  02/12/17 0330 02/13/17 0426 02/14/17 0421  PROT 5.5* 5.1* 5.5*  ALBUMIN 2.1* 2.0* 2.1*  AST 76* 174* 213*  ALT 119* 220* 270*  ALKPHOS 83 80 94  BILITOT 1.3* 1.3* 1.2   PT/INR No results for input(s):  LABPROT, INR in the last 72 hours. Hepatitis Panel No results for input(s): HEPBSAG, HCVAB, HEPAIGM, HEPBIGM in the last 72 hours.  Studies/Results: Ct Abdomen Pelvis W Contrast  Result Date: 02/13/2017 CLINICAL DATA:  Postop recent cholecystectomy, subsequent pancreatitis, fevers, elevated white count EXAM: CT ABDOMEN AND PELVIS WITH CONTRAST TECHNIQUE: Multidetector CT imaging of the abdomen and pelvis was performed using the standard protocol following bolus administration of intravenous contrast. CONTRAST:  30mL ISOVUE-300 IOPAMIDOL (ISOVUE-300) INJECTION 61%, ISOVUE-300 IOPAMIDOL (ISOVUE-300) INJECTION 61% COMPARISON:  02/04/2017, 02/05/2017 FINDINGS: Lower chest: Small pleural effusions bilaterally with left basilar atelectasis. Normal heart size. No pericardial effusion. Hepatobiliary: Cholecystectomy noted. Mild diffuse biliary prominence without obstruction. Focal fatty infiltration of the liver along the falciform ligament. No other focal hepatic abnormality. Small amount of upper abdominal ascites about the anterior liver and spleen. Diffuse edema throughout the mesentery and peritoneum. Small scattered interloop fluid collections throughout the abdomen and pelvis, 1 measuring 2.2 cm in the right abdomen, image 50. Scattered anterior abdominal locules of free air related to the recent surgery. In the lower abdomen and extending into the pelvis, there is ill-defined peripherally enhancing fluid along the right pericolic gutter and psoas muscle roughly measuring 2 x 6.7 cm, image 60.  Difficult to exclude developing abscess. Further into the pelvis, there is free fluid superior to the bladder and dependent cul-de-sac fluid as well. Diffuse peritoneal enhancement noted throughout the pelvis. The appearance is concerning for postoperative diffuse peritonitis with areas of fluid collections concerning for developing abscess throughout the abdomen and pelvis. Fluid over the liver raises the concern  for a bile leak / bile peritonitis. Consider further evaluation with nuclear medicine hepatobiliary scan. Pancreas: Unremarkable. No pancreatic ductal dilatation or surrounding inflammatory changes. Spleen: Normal in size without focal abnormality. Adrenals/Urinary Tract: Adrenal glands are unremarkable. Kidneys are normal, without renal calculi, focal lesion, or hydronephrosis. Bladder is unremarkable. Stomach/Bowel: Mild distention of the small bowel with scattered air-fluid levels. Small bowel loops are dilated up to 4.3 cm in the left upper quadrant. Distal small bowel and colon are also fluid and air filled. Appearance is suggestive of postoperative ileus rather than developing obstruction. Vascular/Lymphatic: No significant vascular findings are present. No enlarged abdominal or pelvic lymph nodes. Reproductive: Uterus and bilateral adnexa are unremarkable. Other: No hernia evident. Mild anasarca of the lower abdomen and hip regions. Musculoskeletal: No acute osseous finding. IMPRESSION: Status post cholecystectomy. Diffuse abdominal and pelvic ascites, scattered interloop abdominopelvic fluid collections, and diffuse peritoneal and mesenteric enhancement. Findings compatible with postoperative peritonitis possibly related to a bile leak. Consider further evaluation with hepatobiliary scan. Small scattered locules of free air, compatible with a recent laparoscopic surgery. Nonspecific small bowel dilatation, favor postoperative ileus over obstruction Electronically Signed   By: Judie Petit.  Shick M.D.   On: 02/13/2017 14:31    ASSESMENT:   *  Post ERCP pancreatitis, by CT scan, pancreas unremarkable.  *  Post-op peritonitis.  Ct raised possibility of bile leak.  HIDA scan reviewed with Dr Carmelina Noun, radiologist: there is no bile leak.  Is it possible she had bile leak but that it has resolved and we are seeing sequela?  Meropenem added 9/20.  WBCs, transaminases rising.  Clinically looks better.    *   Hypokalemia.  Corrected.    *  Ileus. Clinically improved.  TPN orders in place.    PLAN   *  TNA to start today.   Yesterday PM, surgery place order for perc catheter placement.  Case d/w Dr Marina Goodell, he feels pt does not need drainage as she is improving.  Surgery has not seen the pt yet today.   Advancing diet to full liquids.     Jennye Moccasin  02/14/2017, 3:03 PM Pager: [phone]  GI ATTENDING  Interval history, laboratories, x-rays reviewed. Patient personally seen and examined. Parents in room. Agree with interval progress note. Overall patient looks and feels significantly better. Afebrile past 24 hours. On antibiotics. HIDA scan negative for active bile leak. Parenteral nutrition being started. Patient interested in full liquids. Mild elevation of white count and LFTs compared to yesterday noted. At this point I recommend ongoing current management plan without change. Will allow full liquids. I would NOT performed percutaneous drainage at this time as the HIDA scan was negative and she looks and feels better on antibiotics. However, continue to monitor closely. Discussed all the above in great detail with the patient and her parents.  [name]. Eda Keys., M.D. [facility] Division of Gastroenterology

## 2017-02-14 NOTE — Progress Notes (Signed)
CT guided percutaneous drainage ordered by surgery on 9/18 discontinued and Lovenox resumed. Attempted to page surgery as there is no note from them so far today. - Patient has clinically improved - NM hepatobiliary scan did not show bile leak. - GI recommends against drainage.

## 2017-02-14 NOTE — Progress Notes (Signed)
Initial Nutrition Assessment  DOCUMENTATION CODES:   Severe malnutrition in context of acute illness/injury  INTERVENTION:  TPN per Pharmacy.  Monitor magnesium, potassium, and phosphorus daily for at least 3 days, MD to replete as needed, as pt is at risk for refeeding syndrome given NPO, unable to tolerate PO over the past 11 days.  RD to continue to monitor.  NUTRITION DIAGNOSIS:   Malnutrition (severe) related to acute illness (pancreatitis, ileus) as evidenced by percent weight loss, energy intake < or equal to 50% for > or equal to 5 days.  GOAL:   Patient will meet greater than or equal to 90% of their needs  MONITOR:   Supplement acceptance, Labs, Weight trends, PO intake, Diet advancement, TF tolerance, Other (Comment) (TPN tolerance)  REASON FOR ASSESSMENT:   Consult New TPN/TNA  ASSESSMENT:   26 yo female with PMHx of cholelithiasis, asthma, transient elevated LFTs, and IBS presents with gradually worsening RUQ abdominal with associated nausea. Patient underwent cholecystectomy with cholangiogram on 02/05/17 and ERCP on 02/06/17.  Post-operatively patient developed pancreatitis and ileus.  Repeat CT showed intra-abdominal and pelvic fluid collection and possible bile leak.  Pt unavailable, in procedure during time of visit. PICC placed, plans to start TPN today. Per Pharmacy note, plans to start Clinimix E 5/15 at 30 ml/hr + 20% ILE at 10 ml/hr x 12 hrs.  Diet has been advanced to a clear liquid diet. Per weight records, pt with a 6% weight loss in 1 week. Pt at risk for refeeding syndrome due to severe acute malnutrition, being NPO,  and unable to tolerate most PO since admission.   Unable to complete Nutrition-Focused physical exam at this time.   Labs and medications reviewed. AST elevated at 213. ALT elevated at 270.  Diet Order:  TPN (CLINIMIX-E) Adult Diet NPO time specified Except for: Other (See Comments)  Skin:   (Incision on abdomen)  Last BM:   9/18  Height:   Ht Readings from Last 1 Encounters:  02/06/17  (1.626 m)    Weight:   Wt Readings from Last 1 Encounters:  02/13/17 122 lb 12.8 oz (55.7 kg)    Ideal Body Weight:  54.5 kg  BMI:  Body mass index is 21.08 kg/m.  Estimated Nutritional Needs:   Kcal:  1800-2000  Protein:  85-100 grams  Fluid:  Per MD  EDUCATION NEEDS:   No education needs identified at this time  Roslyn Smiling, MS, RD, LDN Pager # [phone] After hours/ weekend pager # [phone]

## 2017-02-14 NOTE — Progress Notes (Signed)
   Subjective: She was seen in her room with family today following a NM hepatobiliary scan for fluid seen on CT yesterday. She states she feels a little better today and slept a little better last night. Labial swelling continue to improve, but still present. Lower extremity swelling remains improved. She continues to endorse abdominal pain that is described as "tight".   Objective:  Vital signs in last 24 hours: Vitals:   02/13/17 1900 02/13/17 2105 02/14/17 0543 02/14/17 1338  BP:  135/83 120/70 120/66  Pulse:  (!) 106 (!) 101 99  Resp:  Temp:  99.6 F (37.6 C) 99.4 F (37.4 C) 99.1 F (37.3 C)  TempSrc:  Oral Oral Oral  SpO2:  100% 99% 99%  Weight: 122 lb 12.8 oz (55.7 kg)     Height:       Gen. Well-developed, well-nourished, in bed, in no acute distress. Extremities. Improving labial edema R>L, no erythema or tenderness, Pulses intact and symmetrical. Abdomen. Distended with tenderness to deep palpation at all quadrants, Hypoactive Bowl sounds. Chest. Clear bilaterally. CV. Regular rate and rhythm.  Assessment/Plan:  Choledocholithiasis, cholecystitis, Post-ERCP Pancreatitis, Post-operative Ileus . Her pancreatitis seems improving, bowel sounds normactive and having liquid bowl movements. Swelling of her genitalia and legs has improved. KUB 9/15 showed normal bowl gas pattern without signs of illeus. Fever of 101 on 9/17, blood cultures NGTD. WBC trending up to 18.5 on 9/18 from 17.7 1 day priot. Lipase 45 on 9/17. Able to eat only very minimal of her diet. CT Abd/Pelvis 9/18 showed diffuse abdominal and pelvic ascites with scattered interloop abdominopelvic fluid collections and diffuse peritoneal and mesenteric enhancement compatible with postoperative peritonitis possibly related to a bile leak. CT also showed SB Dilation favoring ileus. NM hepatobiliary scan showed no evidence of bile leak or biliary obstruction and transient reflux of duodenal activity into the  stomach. - We appreciate surgery and GI recommendations. - Surgery has ordered percutaneous drain per their note - TPN via PICC - Continue pain management as needed. - Meropenem (per pharmacy) - Continue MiraLAX. - AM CMP and CBC  Hypokalemia: K 4.0 on 9/19 following 5 runs of IV potassium. - Continue to monitor  Transaminitis. Alkaline phosphatase normalized, AST (breifly normalized) and ALT elevated. Bilirubin mildly elevated.  Dispo: Anticipated discharge in approximately 2-5 day(s).   Beola Cord, MD 02/14/2017, 2:53 PM Pager: [phone]

## 2017-02-14 NOTE — Progress Notes (Signed)
Peripherally Inserted Central Catheter/Midline Placement  The IV Nurse has discussed with the patient and/or persons authorized to consent for the patient, the purpose of this procedure and the potential benefits and risks involved with this procedure.  The benefits include less needle sticks, lab draws from the catheter, and the patient may be discharged home with the catheter. Risks include, but not limited to, infection, bleeding, blood clot (thrombus formation), and puncture of an artery; nerve damage and irregular heartbeat and possibility to perform a PICC exchange if needed/ordered by physician.  Alternatives to this procedure were also discussed.  Bard Power PICC patient education guide, fact sheet on infection prevention and patient information card has been provided to patient /or left at bedside.    PICC/Midline Placement Documentation  PICC Double Lumen 02/14/17 PICC Right Basilic 37 cm (Active)  Indication for Insertion or Continuance of Line Administration of hyperosmolar/irritating solutions (i.e. TPN, Vancomycin, etc.) 02/14/2017  2:00 PM  Dressing Change Due 02/21/17 02/14/2017  2:00 PM       Stacie Glaze Horton 02/14/2017, 2:31 PM

## 2017-02-15 DIAGNOSIS — R945 Abnormal results of liver function studies: Secondary | ICD-10-CM

## 2017-02-15 DIAGNOSIS — R7989 Other specified abnormal findings of blood chemistry: Secondary | ICD-10-CM

## 2017-02-15 LAB — BASIC METABOLIC PANEL
Anion gap: 7 (ref 5–15)
BUN: 7 mg/dL (ref 6–20)
CALCIUM: 8.3 mg/dL — AB (ref 8.9–10.3)
CO2: 27 mmol/L (ref 22–32)
CREATININE: 0.57 mg/dL (ref 0.44–1.00)
Chloride: 102 mmol/L (ref 101–111)
GFR calc non Af Amer: >60 mL/min (ref >60)
GLUCOSE: 113 mg/dL — AB (ref 65–99)
Potassium: 3.9 mmol/L (ref 3.5–5.1)
Sodium: 136 mmol/L (ref 135–145)

## 2017-02-15 LAB — CBC
HEMATOCRIT: 32.1 % — AB (ref 36.0–46.0)
HEMOGLOBIN: 10.6 g/dL — AB (ref 12.0–15.0)
MCH: 29.2 pg (ref 26.0–34.0)
MCHC: 33 g/dL (ref 30.0–36.0)
MCV: 88.4 fL (ref 78.0–100.0)
Platelets: 764 10*3/uL — ABNORMAL HIGH (ref 150–400)
RBC: 3.63 MIL/uL — AB (ref 3.87–5.11)
RDW: 13.3 % (ref 11.5–15.5)
WBC: 18.3 10*3/uL — ABNORMAL HIGH (ref 4.0–10.5)

## 2017-02-15 LAB — PHOSPHORUS
PHOSPHORUS: 5.4 mg/dL — AB (ref 2.5–4.6)
PHOSPHORUS: 3.4 mg/dL (ref 2.5–4.6)

## 2017-02-15 LAB — COMPREHENSIVE METABOLIC PANEL
ALK PHOS: 88 U/L (ref 38–126)
ALT: 332 U/L — AB (ref 14–54)
AST: 265 U/L — ABNORMAL HIGH (ref 15–41)
Albumin: 2 g/dL — ABNORMAL LOW (ref 3.5–5.0)
Anion gap: 9 (ref 5–15)
BILIRUBIN TOTAL: 0.7 mg/dL (ref 0.3–1.2)
BUN: 5 mg/dL — ABNORMAL LOW (ref 6–20)
CALCIUM: 8 mg/dL — AB (ref 8.9–10.3)
CHLORIDE: 99 mmol/L — AB (ref 101–111)
CO2: 26 mmol/L (ref 22–32)
CREATININE: 0.56 mg/dL (ref 0.44–1.00)
Glucose, Bld: 311 mg/dL — ABNORMAL HIGH (ref 65–99)
Potassium: 4.1 mmol/L (ref 3.5–5.1)
Sodium: 134 mmol/L — ABNORMAL LOW (ref 135–145)
TOTAL PROTEIN: 5.2 g/dL — AB (ref 6.5–8.1)

## 2017-02-15 LAB — GLUCOSE, CAPILLARY
GLUCOSE-CAPILLARY: 129 mg/dL — AB (ref 65–99)
GLUCOSE-CAPILLARY: 136 mg/dL — AB (ref 65–99)
Glucose-Capillary: 127 mg/dL — ABNORMAL HIGH (ref 65–99)
Glucose-Capillary: 129 mg/dL — ABNORMAL HIGH (ref 65–99)

## 2017-02-15 LAB — MAGNESIUM
MAGNESIUM: 2.2 mg/dL (ref 1.7–2.4)
Magnesium: 1.9 mg/dL (ref 1.7–2.4)

## 2017-02-15 MED ORDER — FAT EMULSION 20 % IV EMUL
250.0000 mL | INTRAVENOUS | Status: AC
  Administered 2017-02-15: 250 mL via INTRAVENOUS
  Filled 2017-02-15: qty 250

## 2017-02-15 MED ORDER — ENSURE ENLIVE PO LIQD
237.0000 mL | Freq: Two times a day (BID) | ORAL | Status: DC
  Administered 2017-02-15 – 2017-02-18 (×3): 237 mL via ORAL

## 2017-02-15 MED ORDER — SODIUM CHLORIDE 0.9 % IV SOLN
INTRAVENOUS | Status: DC
  Administered 2017-02-15: 18:00:00 via INTRAVENOUS

## 2017-02-15 MED ORDER — TRACE MINERALS CR-CU-MN-SE-ZN 10-1000-500-60 MCG/ML IV SOLN
INTRAVENOUS | Status: AC
  Administered 2017-02-15: 17:00:00 via INTRAVENOUS
  Filled 2017-02-15: qty 960

## 2017-02-15 NOTE — Progress Notes (Addendum)
PHARMACY - ADULT TOTAL PARENTERAL NUTRITION CONSULT NOTE   Pharmacy Consult:  TPN Indication:  Prolonged ileus / Pancreatitis  Patient Measurements: Height:  (162.6 cm) Weight: 130 lb (59 kg) IBW/kg (Calculated) : 54.7 TPN AdjBW (KG): 59 Body mass index is 22.31 kg/m.  Assessment:  47 YOF with a history of cholelithiasis and is scheduled for cholecystectomy on 02/07/17; however, her abdominal pain and emesis brought her into the ED on 02/03/17.  Patient underwent cholecystectomy with cholangiogram on 02/05/17 and ERCP on 02/06/17.  Post-operatively patient developed pancreatitis and ileus.  Repeat CT showed intra-abdominal and pelvic fluid collection and possible bile leak.  Pharmacy consulted to manage TPN.  Per patient, she was eating regularly until 3 days PTA and no significant weight loss.  During hospitalization, she has been primarily NPO due to abdominal distension and vomiting.  Patient is at risk for refeeding syndrome.  GI: hx IBS / cholelithiasis.  BL prealbumin low at 7.3.  Pepcid to be added to TPN, Miralax Endo: no hx DM - glu on BMET 311 - unsure why the elevation - potentially could have been drawn too close to TPN, but would expect glu to be higher. CBGs ok Insulin requirements in the past 24 hours: 3 Lytes: K 4.1, Mg 2, CoCa 9.6, Phos elevated at 5.4 Renal: SCr 0.56 stable, BUN WNL - 4.2 L hr / 24 hrs; NS @ 45 mL/hr Pulm: asthma - stable on RA Cards: no hx - BP controlled, tachy Hepatobil: LFTs trending up 213>265, 270>332 (AST/ALT) - Tbili normal  Neuro: scoliosis - PRN Robaxin/tramadol ID: [email or web address] >> Merrem (9/18 >> ) for peritonitis, now afebrile, WBC up 21.9.  BCx pending.  Best Practices: Lovenox  TPN Access: PICC TPN start date: 02/14/17  Nutritional Goals (from Dietician 9/19) : 1800 - 2000 kCal and 85 - 100 gm protein per day  Current Nutrition:  NPO  Plan:  Change to Clinimix 5/15 (NO ELECTROLYTES) at 40 ml/hr due to high phos (will increase to goal rate  slowly) Increase 20% ILE to 20 ml/hr x 12 hrs Daily multivitamin and trace elements in TPN Continue pepcid 20 mg daily in TPN  Continue sensitive SSI Q6H. Reduce NS to 30 ml/hr when next TPN hangs at 1800  Bmet, Mg, Phos in am  Isaac Bliss, PharmD, BCPS, BCCCP Clinical Pharmacist Clinical phone for 02/15/2017 from 7a-3:30p: [phone] If after 3:30p, please call main pharmacy at: [phone] 02/15/2017 8:26 AM    ==========================  Addendum: AM labs contaminated with TPN, repeat labs all WNL.   Azariah Latendresse D. Laney Potash, PharmD, BCPS Pager:  [phone] 02/15/2017, 9:39 PM

## 2017-02-15 NOTE — Progress Notes (Signed)
Patient complaint of pain in the PICC insertion site. Site assessed WNL, no signs of redness,no drainage,no pain down or the upper part of the arm. Able to move R arm .Recommended to try ice pack and warm compress and to keep arm moving.Patient declined hot and ice. Bedside nurse notified.

## 2017-02-15 NOTE — Progress Notes (Signed)
  Date: 02/15/2017  Patient name: Sierra Hernandez  Medical record number: [id number]  Date of birth: [birth year]   I have seen and evaluated this patient and I have discussed the plan of care with the house staff. Please see Dr. Vesta Mixer note for complete details. I concur with his findings with the following additions/corrections:   Interestingly her LFTs are continuing to trend up.  I think this may be due to her peritonitis and irritation.  If they continue to trend up, we may need to consider further work up or imaging.  Continue to trend daily.  She is on TPN, which could be related and the rise in LFTs could be related to her ileus (acute intestinal failure) based on reports.  Will continue to monitor currently.    Inez Catalina, MD 02/15/2017, 4:53 PM

## 2017-02-15 NOTE — Progress Notes (Signed)
   Subjective: She was seen in her room with family today. She states she feels okay today but not quite as good as yesterday. She did not sleep as well last night and experienced some sweating overnight. 24 hr Tmax 100.6. Labial swelling remains improved, but she feels as though it may be shifting from R to L some. Lower extremity swelling remains improved. She continues to endorse lower abdominal pain that is described as "tight". She endorses some pain at PICC site in certain positions.  Objective:  Vital signs in last 24 hours: Vitals:   02/14/17 2216 02/15/17 0025 02/15/17 0500 02/15/17 0638  BP: 117/71   100/66  Pulse: 96   93  Resp: 17   17  Temp: (!) 100.6 F (38.1 C) 99.4 F (37.4 C) 98.5 F (36.9 C) 99.1 F (37.3 C)  TempSrc: Oral Oral Oral   SpO2: 99%   98%  Weight:      Height:       Gen. Well-developed, in bed, in no acute distress. Extremities. labial edema, no erythema or tenderness, Pulses intact and symmetrical. Abdomen. Distended with tenderness to deep palpation at all quadrants, Bowl sounds present. Chest. Clear bilaterally. CV. Regular rate and rhythm.  Assessment/Plan:  Choledocholithiasis, Cholecystitis, Post-ERCP Pancreatitis, Post-operative Ileus, Malnutrition, Post-operative peritonitis. Her pancreatitis seems improving, bowel sounds normactive and having liquid bowl movements. Swelling of her genitalia and legs has improved. KUB 9/15 showed normal bowl gas pattern without signs of illeus. Fever of 101 on 9/17, blood cultures showed no growth. WBC trend: 17.7 (9/17), 18.5, 21.9, 18.3 (9/20). Lipase 45 on 9/17. Able to eat only very minimal of her diet. CT Abd/Pelvis 9/18 showed diffuse abdominal and pelvic ascites with scattered interloop abdominopelvic fluid collections and diffuse peritoneal and mesenteric enhancement compatible with postoperative peritonitis possibly related to a bile leak. CT also showed SB Dilation favoring ileus. Patient started on  empiric antibiotics. . NM hepatobiliary scan showed no evidence of bile leak or biliary obstruction and transient reflux of duodenal activity into the stomach. - Illeus Improving, recent soft BM. WBC trend back down 9/20 at 18.3 - We appreciate surgery and GI recommendations. - TPN via PICC - SSI - Continue pain management as needed. - Meropenem (per pharmacy) - Continue MiraLAX. - Full liquid diet - AM CMP and CBC  Hypokalemia: K 4.0 on 9/19 following 5 runs of IV potassium. - Continue to monitor  Transaminitis. Alkaline phosphatase and Bilirubin normalized, AST and ALT trending up.  Dispo: Anticipated discharge in approximately 3-7 day(s).   Beola Cord, MD 02/15/2017, 11:04 AM Pager: [phone]

## 2017-02-15 NOTE — Progress Notes (Signed)
PICC line reassessed,patient verbalized relief from pain on the R arm. Able to move R arm easily.

## 2017-02-15 NOTE — Progress Notes (Signed)
Patient ID: Sierra Hernandez, female   DOB: 13-Mar-1991, 26 y.o.   MRN: [id number] Late entry: I met with Japleen and her parents 9/19 around 5 PM. I discussed the results of her hiatus scan showing no bile leak. I also discussed the findings of interventional radiology in regards to the fluid collections in her abdomen. She is clinically much improved. We will hold off on any percutaneous drain at this time. Hopefully she can go home and next 24-48 hours. I suggest discussed the pathophysiology of both gallbladder disease and primary choledocholithiasis because Coy is maternal grandfather has had the above. I answered their questions.  Georganna Skeans, MD, MPH, FACS Trauma: [phone] General Surgery: [phone]

## 2017-02-15 NOTE — Progress Notes (Signed)
Patient ID: Sierra Hernandez, female   DOB: 17-Jan-1991, 26 y.o.   MRN: [id number]  [facility] Progress Note  9 Days Post-Op  Subjective: CC- ileus Patient states that she was feeling much better yesterday afternoon, not as well today. Having some persistent lower and LUQ abdominal pain. Passing small amount of flatus today. Small BM yesterday. Denies n/v. Tolerating very few full liquids.  CBC Pending. TMAX 100.6  Objective: Vital signs in last 24 hours: Temp:  [98.5 F (36.9 C)-100.6 F (38.1 C)] 99.1 F (37.3 C) (09/20 4098) Pulse Rate:  [93-99] 93 (09/20 0638) Resp:  [17-20] 17 (09/20 1191) BP: (100-120)/(66-71) 100/66 (09/20 4782) SpO2:  [98 %-99 %] 98 % (09/20 9562) Last BM Date: 02/14/17  Intake/Output from previous day: 09/19 0701 - 09/20 0700 In: 1563.7 [P.O.:120; I.V.:1043.7; IV Piggyback:400] Out: 4200 [Urine:4200] Intake/Output this shift: Total I/O In: -  Out: 300 [Urine:300]  PE: Gen: Alert, NAD HEENT: EOM's intact, pupils equal and round Cardio: RRR Pulm: CTAB, effort normal Cardio: RRR Abd: Soft, mild distension, few BS, incisions C/D/I, mild LLQ and RLQ tenderness with no rebound or guarding Skin: no rashes noted, warm and dry  Lab Results:   Recent Labs  02/13/17 0426 02/14/17 0421  WBC 18.5* 21.9*  HGB 10.4* 10.7*  HCT 31.3* 32.3*  PLT 569* 637*   BMET  Recent Labs  02/14/17 0421 02/15/17 0400  NA 134* 134*  K 4.0 4.1  CL 99* 99*  CO2 25 26  GLUCOSE 100* 311*  BUN <5* 5*  CREATININE 0.68 0.56  CALCIUM 8.0* 8.0*   PT/INR No results for input(s): LABPROT, INR in the last 72 hours. CMP     Component Value Date/Time   NA 134 (L) 02/15/2017 0400   K 4.1 02/15/2017 0400   CL 99 (L) 02/15/2017 0400   CO2 26 02/15/2017 0400   GLUCOSE 311 (H) 02/15/2017 0400   BUN 5 (L) 02/15/2017 0400   CREATININE 0.56 02/15/2017 0400   CALCIUM 8.0 (L) 02/15/2017 0400   PROT 5.2 (L) 02/15/2017 0400   ALBUMIN 2.0 (L) 02/15/2017  0400   AST 265 (H) 02/15/2017 0400   ALT 332 (H) 02/15/2017 0400   ALKPHOS 88 02/15/2017 0400   BILITOT 0.7 02/15/2017 0400   GFRNONAA >60 02/15/2017 0400   GFRAA >60 02/15/2017 0400   Lipase     Component Value Date/Time   LIPASE 45 02/12/2017 0330       Studies/Results: Nm Hepatobiliary Liver Func  Result Date: 02/14/2017 CLINICAL DATA:  Postop cholecystectomy with continued abdominal pain and nausea. Evaluate for biliary leak. EXAM: NUCLEAR MEDICINE HEPATOBILIARY IMAGING TECHNIQUE: Sequential images of the abdomen were obtained out to 60 minutes following intravenous administration of radiopharmaceutical. RADIOPHARMACEUTICALS:  5.1 mCi Tc-65m  Choletec IV COMPARISON:  CT 02/13/2017.  ERCP 02/06/2014. FINDINGS: Initial images demonstrate homogeneous hepatic activity with prompt opacification of the biliary system. There is spontaneous drainage into the small bowel. Transient/ fluctuating activity projecting inferior to the left hepatic lobe is most consistent with reflux of activity into the stomach. There is no persistent or progressive extraluminal activity to suggest a bile leak. IMPRESSION: 1. No evidence of bile leak or biliary obstruction status post cholecystectomy. 2. Transient reflux of duodenal activity into the stomach. Electronically Signed   By: Carey Bullocks M.D.   On: 02/14/2017 14:18   Ct Abdomen Pelvis W Contrast  Result Date: 02/13/2017 CLINICAL DATA:  Postop recent cholecystectomy, subsequent pancreatitis, fevers, elevated white count EXAM: CT ABDOMEN  AND PELVIS WITH CONTRAST TECHNIQUE: Multidetector CT imaging of the abdomen and pelvis was performed using the standard protocol following bolus administration of intravenous contrast. CONTRAST:  30mL ISOVUE-300 IOPAMIDOL (ISOVUE-300) INJECTION 61%, ISOVUE-300 IOPAMIDOL (ISOVUE-300) INJECTION 61% COMPARISON:  02/04/2017, 02/05/2017 FINDINGS: Lower chest: Small pleural effusions bilaterally with left basilar  atelectasis. Normal heart size. No pericardial effusion. Hepatobiliary: Cholecystectomy noted. Mild diffuse biliary prominence without obstruction. Focal fatty infiltration of the liver along the falciform ligament. No other focal hepatic abnormality. Small amount of upper abdominal ascites about the anterior liver and spleen. Diffuse edema throughout the mesentery and peritoneum. Small scattered interloop fluid collections throughout the abdomen and pelvis, 1 measuring 2.2 cm in the right abdomen, image 50. Scattered anterior abdominal locules of free air related to the recent surgery. In the lower abdomen and extending into the pelvis, there is ill-defined peripherally enhancing fluid along the right pericolic gutter and psoas muscle roughly measuring 2 x 6.7 cm, image 60. Difficult to exclude developing abscess. Further into the pelvis, there is free fluid superior to the bladder and dependent cul-de-sac fluid as well. Diffuse peritoneal enhancement noted throughout the pelvis. The appearance is concerning for postoperative diffuse peritonitis with areas of fluid collections concerning for developing abscess throughout the abdomen and pelvis. Fluid over the liver raises the concern for a bile leak / bile peritonitis. Consider further evaluation with nuclear medicine hepatobiliary scan. Pancreas: Unremarkable. No pancreatic ductal dilatation or surrounding inflammatory changes. Spleen: Normal in size without focal abnormality. Adrenals/Urinary Tract: Adrenal glands are unremarkable. Kidneys are normal, without renal calculi, focal lesion, or hydronephrosis. Bladder is unremarkable. Stomach/Bowel: Mild distention of the small bowel with scattered air-fluid levels. Small bowel loops are dilated up to 4.3 cm in the left upper quadrant. Distal small bowel and colon are also fluid and air filled. Appearance is suggestive of postoperative ileus rather than developing obstruction. Vascular/Lymphatic: No significant  vascular findings are present. No enlarged abdominal or pelvic lymph nodes. Reproductive: Uterus and bilateral adnexa are unremarkable. Other: No hernia evident. Mild anasarca of the lower abdomen and hip regions. Musculoskeletal: No acute osseous finding. IMPRESSION: Status post cholecystectomy. Diffuse abdominal and pelvic ascites, scattered interloop abdominopelvic fluid collections, and diffuse peritoneal and mesenteric enhancement. Findings compatible with postoperative peritonitis possibly related to a bile leak. Consider further evaluation with hepatobiliary scan. Small scattered locules of free air, compatible with a recent laparoscopic surgery. Nonspecific small bowel dilatation, favor postoperative ileus over obstruction Electronically Signed   By: Judie Petit.  Shick M.D.   On: 02/13/2017 14:31    Anti-infectives: Anti-infectives    Start     Dose/Rate Route Frequency Ordered Stop   02/13/17 1615  meropenem (MERREM) 1 g in sodium chloride 0.9 % 100 mL IVPB     1 g 200 mL/hr over 30 Minutes Intravenous Every 8 hours 02/13/17 1605     02/05/17 0830  ceFAZolin (ANCEF) IVPB 2g/100 mL premix     2 g 200 mL/hr over 30 Minutes Intravenous On call to O.R. 02/05/17 0829 02/05/17 0959   02/03/17 2200  metroNIDAZOLE (FLAGYL) IVPB 500 mg  Status:  Discontinued     500 mg 100 mL/hr over 60 Minutes Intravenous Every 8 hours 02/03/17 2127 02/07/17 0603   02/03/17 2200  cefTRIAXone (ROCEPHIN) 2 g in dextrose 5 % 50 mL IVPB  Status:  Discontinued     2 g 100 mL/hr over 30 Minutes Intravenous Every 24 hours 02/03/17 2127 02/07/17 0603       Assessment/Plan IBS -  takes miralax almost daily at home  Calculus of bile duct with acute cholecystitis without mention of obstruction S/p lap chole 9/10 Dr. Luisa Hart - POD 10 - s/p ERCP 9/11 - Post-ERCP pancreatitis - lipase WNL (9/17) - LFTs still going up AST 265 and ALT 332, bilirunin WNL - WBC pending. TMAX 100.6 - CT scan 9/18 showed diffuse abdominal and  pelvic ascites, scattered interloop abdominopelvic fluid collections, and diffuse  peritoneal and mesenteric enhancement - HIDA 9/20 negative for leak  ID - Merrem 9/18>>day #3. Rocephin/flagyl 9/8>>9/12 FEN - TPN, FLD, add Ensure VTE - SCDs, lovenox Foley - none Follow up - 2-3 weeks [facility] clinic  Plan - CBC pending. Patient not feeling quite as well as yesterday. Continue full liquids, add Ensure, and mobilize today.    LOS: 12 days    Franne Forts , [facility] 02/15/2017, 9:01 AM Pager: [phone] Consults: [phone] Mon-Fri 7:00 am-4:30 pm Sat-Sun 7:00 am-11:30 am

## 2017-02-15 NOTE — Progress Notes (Signed)
Daily Rounding Note  02/15/2017, 9:51 AM  LOS: 12 days   SUBJECTIVE:   Chief complaint: Had loose but more substantial BM and some flatus overnight.  No nausea with FL diet but it causes some increase sense of discomfort and pressure in lower abdomen.    T max 100.6 yesterday evening.   Asking for Miralax, which is scheduled as daily dose but she has not received in a few days.    OBJECTIVE:         Vital signs in last 24 hours:    Temp:  [98.5 F (36.9 C)-100.6 F (38.1 C)] 99.1 F (37.3 C) (09/20 5784) Pulse Rate:  [93-99] 93 (09/20 0638) Resp:  [17-20] 17 (09/20 6962) BP: (100-120)/(66-71) 100/66 (09/20 0638) SpO2:  [98 %-99 %] 98 % (09/20 0638) Last BM Date: 02/14/17 Filed Weights   02/04/17 0600 02/06/17 1217 02/13/17 1900  Weight: 59 kg (130 lb 1.6 oz) 59 kg (130 lb) 55.7 kg (122 lb 12.8 oz)   General: looks better, not toxic.     Heart: RRR Chest: clear bil.  No cough or labored breathing. Abdomen: soft, slight distention.  Mild right sided tenderness.    Extremities: no LE edeam Neuro/Psych:  Fully alert and oriented.  No tremors or deficits.    Intake/Output from previous day: 09/19 0701 - 09/20 0700 In: 1563.7 [P.O.:120; I.V.:1043.7; IV Piggyback:400] Out: 4200 [Urine:4200]  Intake/Output this shift: Total I/O In: -  Out: 300 [Urine:300]  Lab Results:  Recent Labs  02/13/17 0426 02/14/17 0421  WBC 18.5* 21.9*  HGB 10.4* 10.7*  HCT 31.3* 32.3*  PLT 569* 637*   BMET  Recent Labs  02/13/17 0426 02/14/17 0421 02/15/17 0400  NA 134* 134* 134*  K 3.1* 4.0 4.1  CL 99* 99* 99*  CO2 GLUCOSE 111* 100* 311*  BUN <5* <5* 5*  CREATININE 0.61 0.68 0.56  CALCIUM 7.8* 8.0* 8.0*   LFT  Recent Labs  02/13/17 0426 02/14/17 0421 02/15/17 0400  PROT 5.1* 5.5* 5.2*  ALBUMIN 2.0* 2.1* 2.0*  AST 174* 213* 265*  ALT 220* 270* 332*  ALKPHOS 80 94 88  BILITOT 1.3* 1.2 0.7    PT/INR No results for input(s): LABPROT, INR in the last 72 hours. Hepatitis Panel No results for input(s): HEPBSAG, HCVAB, HEPAIGM, HEPBIGM in the last 72 hours.  Studies/Results: Nm Hepatobiliary Liver Func  Result Date: 02/14/2017 CLINICAL DATA:  Postop cholecystectomy with continued abdominal pain and nausea. Evaluate for biliary leak. EXAM: NUCLEAR MEDICINE HEPATOBILIARY IMAGING TECHNIQUE: Sequential images of the abdomen were obtained out to 60 minutes following intravenous administration of radiopharmaceutical. RADIOPHARMACEUTICALS:  5.1 mCi Tc-23m  Choletec IV COMPARISON:  CT 02/13/2017.  ERCP 02/06/2014. FINDINGS: Initial images demonstrate homogeneous hepatic activity with prompt opacification of the biliary system. There is spontaneous drainage into the small bowel. Transient/ fluctuating activity projecting inferior to the left hepatic lobe is most consistent with reflux of activity into the stomach. There is no persistent or progressive extraluminal activity to suggest a bile leak. IMPRESSION: 1. No evidence of bile leak or biliary obstruction status post cholecystectomy. 2. Transient reflux of duodenal activity into the stomach. Electronically Signed   By: Carey Bullocks M.D.   On: 02/14/2017 14:18   Ct Abdomen Pelvis W Contrast  Result Date: 02/13/2017 CLINICAL DATA:  Postop recent cholecystectomy, subsequent pancreatitis, fevers, elevated white count EXAM: CT ABDOMEN AND PELVIS WITH CONTRAST TECHNIQUE: Multidetector CT imaging  of the abdomen and pelvis was performed using the standard protocol following bolus administration of intravenous contrast. CONTRAST:  30mL ISOVUE-300 IOPAMIDOL (ISOVUE-300) INJECTION 61%, ISOVUE-300 IOPAMIDOL (ISOVUE-300) INJECTION 61% COMPARISON:  02/04/2017, 02/05/2017 FINDINGS: Lower chest: Small pleural effusions bilaterally with left basilar atelectasis. Normal heart size. No pericardial effusion. Hepatobiliary: Cholecystectomy noted. Mild diffuse  biliary prominence without obstruction. Focal fatty infiltration of the liver along the falciform ligament. No other focal hepatic abnormality. Small amount of upper abdominal ascites about the anterior liver and spleen. Diffuse edema throughout the mesentery and peritoneum. Small scattered interloop fluid collections throughout the abdomen and pelvis, 1 measuring 2.2 cm in the right abdomen, image 50. Scattered anterior abdominal locules of free air related to the recent surgery. In the lower abdomen and extending into the pelvis, there is ill-defined peripherally enhancing fluid along the right pericolic gutter and psoas muscle roughly measuring 2 x 6.7 cm, image 60. Difficult to exclude developing abscess. Further into the pelvis, there is free fluid superior to the bladder and dependent cul-de-sac fluid as well. Diffuse peritoneal enhancement noted throughout the pelvis. The appearance is concerning for postoperative diffuse peritonitis with areas of fluid collections concerning for developing abscess throughout the abdomen and pelvis. Fluid over the liver raises the concern for a bile leak / bile peritonitis. Consider further evaluation with nuclear medicine hepatobiliary scan. Pancreas: Unremarkable. No pancreatic ductal dilatation or surrounding inflammatory changes. Spleen: Normal in size without focal abnormality. Adrenals/Urinary Tract: Adrenal glands are unremarkable. Kidneys are normal, without renal calculi, focal lesion, or hydronephrosis. Bladder is unremarkable. Stomach/Bowel: Mild distention of the small bowel with scattered air-fluid levels. Small bowel loops are dilated up to 4.3 cm in the left upper quadrant. Distal small bowel and colon are also fluid and air filled. Appearance is suggestive of postoperative ileus rather than developing obstruction. Vascular/Lymphatic: No significant vascular findings are present. No enlarged abdominal or pelvic lymph nodes. Reproductive: Uterus and bilateral  adnexa are unremarkable. Other: No hernia evident. Mild anasarca of the lower abdomen and hip regions. Musculoskeletal: No acute osseous finding. IMPRESSION: Status post cholecystectomy. Diffuse abdominal and pelvic ascites, scattered interloop abdominopelvic fluid collections, and diffuse peritoneal and mesenteric enhancement. Findings compatible with postoperative peritonitis possibly related to a bile leak. Consider further evaluation with hepatobiliary scan. Small scattered locules of free air, compatible with a recent laparoscopic surgery. Nonspecific small bowel dilatation, favor postoperative ileus over obstruction Electronically Signed   By: Judie Petit.  Shick M.D.   On: 02/13/2017 14:31   Scheduled Meds: . enoxaparin (LOVENOX) injection  40 mg Subcutaneous Q24H  . feeding supplement (ENSURE ENLIVE)  237 mL Oral BID BM  . insulin aspart  0-9 Units Subcutaneous Q6H  . polyethylene glycol  17 g Oral Daily  . sodium chloride flush  10-40 mL Intracatheter Q12H   Continuous Infusions: . sodium chloride 45 mL/hr at 02/15/17 0843  . sodium chloride    . TPN (CLINIMIX) Adult without lytes     And  . fat emulsion    . meropenem (MERREM) IV Stopped (02/15/17 0526)  . methocarbamol (ROBAXIN)  IV 500 mg (02/13/17 1543)  . Marland KitchenTPN (CLINIMIX-E) Adult 30 mL/hr at 02/14/17 1738   PRN Meds:.acetaminophen, [COMPLETED] bisacodyl **FOLLOWED BY** bisacodyl, fentaNYL (SUBLIMAZE) injection, methocarbamol (ROBAXIN)  IV, metoCLOPramide (REGLAN) injection, ondansetron (ZOFRAN) IV, promethazine, senna-docusate, simethicone, sodium chloride flush, traMADol   ASSESMENT:   *  Post-op peritonitis, ? Due to post op bile leak?: no bile leak on 9/19 HIDA.   Meropenem day 3.  Transaminases rising.  Fever curve improved.  WBCs pending. Clinically looks better.    *  Post ERCP pancreatitis, by CT scan, pancreas unremarkable.   *  Ileus, clinically improved with liquid/soft BM and flatus overnight.    Tolerating small volumed of  full liquids  *  Protein malnutrition after prolonged poor po intake.  TPN started 9/19.  *  Glucose 311, suspect s/e of TNA.  SS insulin in place.    PLAN   *  CBC pending blood draw, CMET in AM.  Leave full liquid diet in place.   Reminded staff to give the Miralax.     Jennye Moccasin  02/15/2017, 9:51 AM Pager: [phone]  GI ATTENDING  Interval history and data reviewed. Patient personally seen and examined. Agree with interval progress note. No particular complaints. Overall feeling better. Able to eat some full liquids. Wanting crackers. Walking the halls. Low-grade temp past 24 hours otherwise without fever. White blood cell count lower. Peripheral nutrition continues. Continue all current measures without change. Reassess in a.m. Long discussion with patient and father.  [name]. Eda Keys., M.D. [facility] Division of Gastroenterology

## 2017-02-16 DIAGNOSIS — R188 Other ascites: Secondary | ICD-10-CM

## 2017-02-16 LAB — BASIC METABOLIC PANEL
Anion gap: 7 (ref 5–15)
BUN: 6 mg/dL (ref 6–20)
CALCIUM: 8.1 mg/dL — AB (ref 8.9–10.3)
CHLORIDE: 103 mmol/L (ref 101–111)
CO2: 27 mmol/L (ref 22–32)
CREATININE: 0.59 mg/dL (ref 0.44–1.00)
Glucose, Bld: 118 mg/dL — ABNORMAL HIGH (ref 65–99)
Potassium: 4 mmol/L (ref 3.5–5.1)
SODIUM: 137 mmol/L (ref 135–145)

## 2017-02-16 LAB — CBC
HCT: 30.4 % — ABNORMAL LOW (ref 36.0–46.0)
HEMOGLOBIN: 9.9 g/dL — AB (ref 12.0–15.0)
MCH: 29 pg (ref 26.0–34.0)
MCHC: 32.6 g/dL (ref 30.0–36.0)
MCV: 89.1 fL (ref 78.0–100.0)
Platelets: 677 10*3/uL — ABNORMAL HIGH (ref 150–400)
RBC: 3.41 MIL/uL — ABNORMAL LOW (ref 3.87–5.11)
RDW: 13.3 % (ref 11.5–15.5)
WBC: 14.3 10*3/uL — ABNORMAL HIGH (ref 4.0–10.5)

## 2017-02-16 LAB — GLUCOSE, CAPILLARY
GLUCOSE-CAPILLARY: 114 mg/dL — AB (ref 65–99)
GLUCOSE-CAPILLARY: 107 mg/dL — AB (ref 65–99)
Glucose-Capillary: 122 mg/dL — ABNORMAL HIGH (ref 65–99)
Glucose-Capillary: 123 mg/dL — ABNORMAL HIGH (ref 65–99)
Glucose-Capillary: 123 mg/dL — ABNORMAL HIGH (ref 65–99)
Glucose-Capillary: 127 mg/dL — ABNORMAL HIGH (ref 65–99)

## 2017-02-16 LAB — PHOSPHORUS: PHOSPHORUS: 4.8 mg/dL — AB (ref 2.5–4.6)

## 2017-02-16 LAB — MAGNESIUM: MAGNESIUM: 1.9 mg/dL (ref 1.7–2.4)

## 2017-02-16 MED ORDER — AMOXICILLIN-POT CLAVULANATE 875-125 MG PO TABS
1.0000 | ORAL_TABLET | Freq: Two times a day (BID) | ORAL | Status: DC
  Administered 2017-02-16 – 2017-02-19 (×7): 1 via ORAL
  Filled 2017-02-16 (×7): qty 1

## 2017-02-16 MED ORDER — INSULIN ASPART 100 UNIT/ML ~~LOC~~ SOLN
0.0000 [IU] | Freq: Three times a day (TID) | SUBCUTANEOUS | Status: DC
  Administered 2017-02-17: 1 [IU] via SUBCUTANEOUS

## 2017-02-16 MED ORDER — TRACE MINERALS CR-CU-MN-SE-ZN 10-1000-500-60 MCG/ML IV SOLN
INTRAVENOUS | Status: DC
  Administered 2017-02-16: 17:00:00 via INTRAVENOUS
  Filled 2017-02-16: qty 960

## 2017-02-16 MED ORDER — FAT EMULSION 20 % IV EMUL
240.0000 mL | INTRAVENOUS | Status: DC
Start: 2017-02-16 — End: 2017-02-17
  Administered 2017-02-16: 240 mL via INTRAVENOUS
  Filled 2017-02-16: qty 250

## 2017-02-16 NOTE — Progress Notes (Signed)
Daily Rounding Note  02/16/2017, 8:19 AM  LOS: 13 days   SUBJECTIVE:   Chief complaint: Abdominal pain: improved.        No fevers in ~ 36 hours.  Tolerating full liquids with no further nausea or post prandial pain.  Wants to eat/drink real food.  Stools soft "mushy", 2 this AM, 3 or 4 more watery yesterday.  Feels better every day. Walking in halls.      OBJECTIVE:         Vital signs in last 24 hours:    Temp:  [98 F (36.7 C)-98.3 F (36.8 C)] 98.2 F (36.8 C) (09/21 0611) Pulse Rate:  [80-99] 86 (09/21 0611) Resp:  [16-20] 18 (09/21 0611) BP: (106-110)/(57-72) 106/57 (09/21 0611) SpO2:  [98 %-100 %] 98 % (09/21 0611) Last BM Date: 02/14/17 Filed Weights   02/04/17 0600 02/06/17 1217 02/13/17 1900  Weight: 59 kg (130 lb 1.6 oz) 59 kg (130 lb) 55.7 kg (122 lb 12.8 oz)   General: pleasant, comfortable.  Looks better, not toxic.   Heart: RRR Chest: clear bil.  No cough or SOB Abdomen: soft, less distended.  BS present, hypoactive.  Slightly tender on right and in bil lower abcomen.  Reduced labial swelling  Extremities: no edema Neuro/Psych:  Pleasant, calm, in good spirits.   Intake/Output from previous day: 09/20 0701 - 09/21 0700 In: 2209.2 [P.O.:120; I.V.:1889.2; IV Piggyback:200] Out: 3400 [Urine:3400]  Intake/Output this shift: Total I/O In: -  Out: 200 [Urine:200]  Lab Results:  Recent Labs  02/14/17 0421 02/15/17 0900 02/16/17 0444  WBC 21.9* 18.3* 14.3*  HGB 10.7* 10.6* 9.9*  HCT 32.3* 32.1* 30.4*  PLT 637* 764* 677*   BMET  Recent Labs  02/15/17 0400 02/15/17 2011 02/16/17 0444  NA 134* 136 137  K 4.1 3.9 4.0  CL 99* 102 103  CO2 GLUCOSE 311* 113* 118*  BUN 5* 7 6  CREATININE 0.56 0.57 0.59  CALCIUM 8.0* 8.3* 8.1*   LFT  Recent Labs  02/14/17 0421 02/15/17 0400  PROT 5.5* 5.2*  ALBUMIN 2.1* 2.0*  AST 213* 265*  ALT 270* 332*  ALKPHOS 94 88  BILITOT 1.2  0.7   PT/INR No results for input(s): LABPROT, INR in the last 72 hours. Hepatitis Panel No results for input(s): HEPBSAG, HCVAB, HEPAIGM, HEPBIGM in the last 72 hours.  Studies/Results: Nm Hepatobiliary Liver Func  Result Date: 02/14/2017 CLINICAL DATA:  Postop cholecystectomy with continued abdominal pain and nausea. Evaluate for biliary leak. EXAM: NUCLEAR MEDICINE HEPATOBILIARY IMAGING TECHNIQUE: Sequential images of the abdomen were obtained out to 60 minutes following intravenous administration of radiopharmaceutical. RADIOPHARMACEUTICALS:  5.1 mCi Tc-32m  Choletec IV COMPARISON:  CT 02/13/2017.  ERCP 02/06/2014. FINDINGS: Initial images demonstrate homogeneous hepatic activity with prompt opacification of the biliary system. There is spontaneous drainage into the small bowel. Transient/ fluctuating activity projecting inferior to the left hepatic lobe is most consistent with reflux of activity into the stomach. There is no persistent or progressive extraluminal activity to suggest a bile leak. IMPRESSION: 1. No evidence of bile leak or biliary obstruction status post cholecystectomy. 2. Transient reflux of duodenal activity into the stomach. Electronically Signed   By: Carey Bullocks M.D.   On: 02/14/2017 14:18   Scheduled Meds: . enoxaparin (LOVENOX) injection  40 mg Subcutaneous Q24H  . feeding supplement (ENSURE ENLIVE)  237 mL Oral BID BM  . insulin aspart  0-9  Units Subcutaneous Q6H  . polyethylene glycol  17 g Oral Daily  . sodium chloride flush  10-40 mL Intracatheter Q12H   Continuous Infusions: . sodium chloride 30 mL/hr at 02/15/17 1814  . meropenem (MERREM) IV Stopped (02/16/17 0704)  . methocarbamol (ROBAXIN)  IV 500 mg (02/13/17 1543)  . TPN (CLINIMIX) Adult without lytes 40 mL/hr at 02/15/17 1708   PRN Meds:.acetaminophen, [COMPLETED] bisacodyl **FOLLOWED BY** bisacodyl, fentaNYL (SUBLIMAZE) injection, methocarbamol (ROBAXIN)  IV, metoCLOPramide (REGLAN) injection,  ondansetron (ZOFRAN) IV, promethazine, senna-docusate, simethicone, sodium chloride flush, traMADol  ASSESMENT:   * Post-op peritonitis, ? Due to post op bile leak?: no bile leak on 9/19 HIDA.   Meropenem day 4.  Transaminases rising.  Fevers resolved ~ 36 hours.  WBCs improved.  Blood clx without growth at 3 days.Clinically looks better.   * Post ERCP pancreatitis, by CT scan, pancreas unremarkable.   *  Normocytic anemia  *  Thrombocytosis.    *  Ileus, clinically resolved.  Tolerating small volumed of full liquids  *  Protein malnutrition after prolonged poor po intake.  TPN started 9/19.  Tolerating full liquids and asking for real food.    *  Hyperglycemia, suspect s/e of TNA.  SS insulin in place.     PLAN   *  Advance to low fat diet.  If tolerates this well, can stop TPN, tomorrow vs Sunday.  Continue Meropenem, will look to surgery for when to change to po Abx.   CBC in AM.    Sierra Hernandez  02/16/2017, 8:19 AM Pager: [phone]  GI ATTENDING  Interval history data reviewed. Patient personally seen and examined. Agree with interval progress note. Patient doing well. Afebrile. Advancing diet. Antibiotics converted to by mouth. Leukocytosis improving. Abnormal liver tests felt to be reactive have been noted. After she is taking adequate nutrition and is not having any other issues she can be discharged. She will follow-up in our office with [name] in 1-2 weeks. We will arrange that appointment. Mother asked about short-term disability. She asked for office could handle this. I said okay. We will continue to follow until or discharge.  Sierra Hernandez. Eda Keys., M.D. [facility] Division of Gastroenterology

## 2017-02-16 NOTE — Progress Notes (Signed)
  Date: 02/16/2017  Patient name: Sierra Hernandez  Medical record number: [id number]  Date of birth: [birth year]   I have seen and evaluated this patient and I have discussed the plan of care with the house staff. Please see Dr. Vesta Mixer upcoming note for complete details.   Reviewed GI and surgery notes.  Patient improving.  Abx changed to oral medications.  Hopeful that she can wean off of TPN and be discharged soon.   Inez Catalina, MD 02/16/2017, 4:50 PM

## 2017-02-16 NOTE — Progress Notes (Signed)
   Subjective: She was seen in her room with family today. She looks and is feeling better today. She slept better last night and experienced less sweating. 24 hr Tmax 99. Labial swelling improving. Lower extremity swelling resolved. She continues to endorse "tight" lower abdominal pain with some increased sensations after eating. She denies pain at PICC site today.  Objective:  Vital signs in last 24 hours: Vitals:   02/15/17 2024 02/16/17 0520 02/16/17 0611 02/16/17 1326  BP: 106/66 110/67 (!) 106/57 106/62  Pulse: 99 80 86 83  Resp: Temp: 98 F (36.7 C) 98.3 F (36.8 C) 98.2 F (36.8 C) 99 F (37.2 C)  TempSrc: Oral Oral Oral Oral  SpO2: 100% 100% 98% 100%  Weight:      Height:       Gen. Well-developed, in bed, in no acute distress. Extremities. No LE edema, pulses intact and symmetrical. Abdomen. Decreased distention with tenderness to deep palpation, Bowl sounds present. Chest. Clear bilaterally. CV. Regular rate and rhythm.  Assessment/Plan:  Choledocholithiasis, Cholecystitis, Post-ERCP Pancreatitis, Post-operative Ileus, Malnutrition, Post-operative peritonitis. Her pancreatitis is improved, bowel sounds normactive and having BMs. Swelling of her genitalia is improved and swelling of her legs has has resolved. KUB 9/15 showed normal bowl gas pattern without signs of illeus. Fever of 101 on 9/17, blood cultures showed no growth. WBC trend: 17.7 (9/17) -> 18.5 -> 21.9 -> 18.3 -> 14.3 (9/21). Lipase 45 on 9/17. CT Abd/Pelvis 9/18 showed diffuse abdominal and pelvic ascites with scattered interloop abdominopelvic fluid collections and diffuse peritoneal and mesenteric enhancement compatible with postoperative peritonitis possibly related to a bile leak. CT also showed SB Dilation favoring ileus. Patient started on empiric antibiotics. . NM hepatobiliary scan showed no evidence of bile leak or biliary obstruction and transient reflux of duodenal activity into the  stomach. Transitioned from meropenem to Augmentin. Able to tolerate cracker, diet advanced.  - Illeus Improving, recent soft BM. WBC 14.3 on 9/21 - We appreciate surgery and GI recommendations. - TPN via PICC, wean - SSI - Continue pain management as needed. - Augmentin 875-125 PO BID  - Continue MiraLAX. - Heart Diet - AM CMP and CBC  Hypokalemia: K 4.0 on 9/19 following 5 runs of IV potassium. - Continue to monitor  Transaminitis. Alkaline phosphatase and Bilirubin normalized, AST and ALT trending up. Likely secondary to abdominal inflammation.  - Hepatic function panel in AM  Dispo: Anticipated discharge in approximately 2-5 day(s).   Beola Cord, MD 02/16/2017, 2:23 PM Pager: [phone]

## 2017-02-16 NOTE — Progress Notes (Signed)
PHARMACY - ADULT TOTAL PARENTERAL NUTRITION CONSULT NOTE   Pharmacy Consult:  TPN Indication:  Prolonged ileus / Pancreatitis  Patient Measurements: Height:  (162.6 cm) Weight: 130 lb (59 kg) IBW/kg (Calculated) : 54.7 TPN AdjBW (KG): 59 Body mass index is 22.31 kg/m.  Assessment:  24 YOF with a history of cholelithiasis and was scheduled for cholecystectomy on 02/07/17; however, her abdominal pain and emesis brought her into the ED on 02/03/17. Patient underwent cholecystectomy with cholangiogram on 02/05/17 and ERCP on 02/06/17. Post-operatively patient developed pancreatitis and ileus. Repeat CT showed intra-abdominal and pelvic fluid collection and possible bile leak. Pharmacy consulted to manage TPN.  Per patient, she was eating regularly until 3 days PTA and no significant weight loss. During hospitalization, she has been primarily NPO due to abdominal distension and vomiting.  Patient is at risk for refeeding syndrome.  GI: hx IBS / cholelithiasis. Low albumin at 2.0 and prealbumin 7.3. Pepcid is being added to TPN, Miralax Abdominal pain improved; receiving Ensure BID (drinking one/day); tolerating full liquids; soft stool at 2 am; advancing to low fat diet today (if tolerated, plans to stop TPN tomorrow or Sun) Endo: no hx DM; BG typically <150; sensitive SSI q6h Insulin requirements in the past 24 hours: 5 Lytes: WNL except Phos 4.8, CoCa 9.7 Renal: SCr 0.59 stable, BUN WNL, UPO 1.2 L / 24 hr; NS @ 45 mL/hr Pulm: asthma - stable on RA Cards: no hx - BP controlled Hepatobil: LFTs trending up 213>265, 270>332 (AST/ALT) - Tbili normal  Neuro: scoliosis - PRN Robaxin/tramadol ID: CTX >> Merrem (9/18 >> ) for peritonitis, now afebrile, WBC trending down 21.9>>18.3>>14.3  BCx NGTD x 3.  Best Practices: Lovenox  TPN Access: PICC TPN start date: 02/14/17  Nutritional Goals (from Dietician 9/19) : 1800 - 2000 kCal 85 - 100 gm protein per day  Current Nutrition:  Clinimix 5/15  at 40 mL/hr x 24 hr. 20% lipid infusion 20 mL/hr x 12 hr. Ensure BID (taking one/day).  Plan:  Continue Clinimix 5/15 (NO ELECTROLYTES) to 40 ml/hr due to high phos (will increase to goal rate slowly) Continue 20% ILE to 20 ml/hr x 12 hrs Daily multivitamin and trace elements in TPN Continue pepcid 20 mg daily in TPN   This provides 48 g of protein and 1114 kCals per day meeting 52% of protein and 59% of kCal needs. Will likely stop TPN in the next few days.  Reduce sensitive SSI frequency to Q8H. Continue NS 30 ml/hr   CBC in am. Monitor TPN labs. F/U tomorrow.  Sierra Hernandez 02/16/2017, 10:35 am

## 2017-02-16 NOTE — Progress Notes (Signed)
Patient ID: Sierra Hernandez, female   DOB: 1990-07-06, 26 y.o.   MRN: [id number]  [facility] Surgery Progress Note  10 Days Post-Op  Subjective: CC- ileus States that she did well yesterday afternoon. Tolerating small amounts soup and crackers. Feels like she could tolerating solid food. Denies any recent n/v. Abdominal pain improving. She does continue to have some intermittent RUQ pain, and LLQ pain with BMs. She had 2 BMs yesterday and is passing more gas today. Ambulating more in halls. WBC trending down, afebrile x24 hours.  Objective: Vital signs in last 24 hours: Temp:  [98 F (36.7 C)-98.3 F (36.8 C)] 98.2 F (36.8 C) (09/21 0611) Pulse Rate:  [80-99] 86 (09/21 0611) Resp:  [16-20] 18 (09/21 0611) BP: (106-110)/(57-72) 106/57 (09/21 0611) SpO2:  [98 %-100 %] 98 % (09/21 0611) Last BM Date: 02/14/17  Intake/Output from previous day: 09/20 0701 - 09/21 0700 In: 2209.2 [P.O.:120; I.V.:1889.2; IV Piggyback:200] Out: 3400 [Urine:3400] Intake/Output this shift: Total I/O In: -  Out: 200 [Urine:200]  PE: Gen: Alert, NAD HEENT: EOM's intact, pupils equal and round Cardio: RRR Pulm: CTAB, effort normal Cardio: RRR Abd: Soft, mild distension (improving), few BS, incisions C/D/I, mild RUQ tenderness with no rebound or guarding Skin: no rashes noted, warm and dry   Lab Results:   Recent Labs  02/15/17 0900 02/16/17 0444  WBC 18.3* 14.3*  HGB 10.6* 9.9*  HCT 32.1* 30.4*  PLT 764* 677*   BMET  Recent Labs  02/15/17 2011 02/16/17 0444  NA 136 137  K 3.9 4.0  CL 102 103  CO2 27 27  GLUCOSE 113* 118*  BUN 7 6  CREATININE 0.57 0.59  CALCIUM 8.3* 8.1*   PT/INR No results for input(s): LABPROT, INR in the last 72 hours. CMP     Component Value Date/Time   NA 137 02/16/2017 0444   K 4.0 02/16/2017 0444   CL 103 02/16/2017 0444   CO2 27 02/16/2017 0444   GLUCOSE 118 (H) 02/16/2017 0444   BUN 6 02/16/2017 0444   CREATININE 0.59 02/16/2017 0444   CALCIUM 8.1 (L) 02/16/2017 0444   PROT 5.2 (L) 02/15/2017 0400   ALBUMIN 2.0 (L) 02/15/2017 0400   AST 265 (H) 02/15/2017 0400   ALT 332 (H) 02/15/2017 0400   ALKPHOS 88 02/15/2017 0400   BILITOT 0.7 02/15/2017 0400   GFRNONAA >60 02/16/2017 0444   GFRAA >60 02/16/2017 0444   Lipase     Component Value Date/Time   LIPASE 45 02/12/2017 0330       Studies/Results: Nm Hepatobiliary Liver Func  Result Date: 02/14/2017 CLINICAL DATA:  Postop cholecystectomy with continued abdominal pain and nausea. Evaluate for biliary leak. EXAM: NUCLEAR MEDICINE HEPATOBILIARY IMAGING TECHNIQUE: Sequential images of the abdomen were obtained out to 60 minutes following intravenous administration of radiopharmaceutical. RADIOPHARMACEUTICALS:  5.1 mCi Tc-41m  Choletec IV COMPARISON:  CT 02/13/2017.  ERCP 02/06/2014. FINDINGS: Initial images demonstrate homogeneous hepatic activity with prompt opacification of the biliary system. There is spontaneous drainage into the small bowel. Transient/ fluctuating activity projecting inferior to the left hepatic lobe is most consistent with reflux of activity into the stomach. There is no persistent or progressive extraluminal activity to suggest a bile leak. IMPRESSION: 1. No evidence of bile leak or biliary obstruction status post cholecystectomy. 2. Transient reflux of duodenal activity into the stomach. Electronically Signed   By: Carey Bullocks M.D.   On: 02/14/2017 14:18    Anti-infectives: Anti-infectives    Start  Dose/Rate Route Frequency Ordered Stop   02/13/17 1615  meropenem (MERREM) 1 g in sodium chloride 0.9 % 100 mL IVPB     1 g 200 mL/hr over 30 Minutes Intravenous Every 8 hours 02/13/17 1605     02/05/17 0830  ceFAZolin (ANCEF) IVPB 2g/100 mL premix     2 g 200 mL/hr over 30 Minutes Intravenous On call to O.R. 02/05/17 0829 02/05/17 0959   02/03/17 2200  metroNIDAZOLE (FLAGYL) IVPB 500 mg  Status:  Discontinued     500 mg 100 mL/hr over 60  Minutes Intravenous Every 8 hours 02/03/17 2127 02/07/17 0603   02/03/17 2200  cefTRIAXone (ROCEPHIN) 2 g in dextrose 5 % 50 mL IVPB  Status:  Discontinued     2 g 100 mL/hr over 30 Minutes Intravenous Every 24 hours 02/03/17 2127 02/07/17 0603       Assessment/Plan IBS - takes miralax almost daily at home  Calculus of bile duct with acute cholecystitis without mention of obstruction S/p lap chole 9/10 Dr. Luisa Hart - POD 11 - s/p ERCP 9/11 - Post-ERCP pancreatitis - lipase WNL (9/17) - WBC trending down 14.3, afebrile last 24 hours - CT scan 9/18 showed diffuse abdominal and pelvic ascites, scattered interloop abdominopelvic fluid collections, and diffuse  peritoneal and mesenteric enhancement - HIDA 9/20 negative for leak  ID - Augmentin 9/21>> (recommend 5 days). Merrem 9/18>>9/21. Rocephin/flagyl 9/8>>9/12 FEN - TPN, low fat diet, Ensure VTE - SCDs, lovenox Foley - none Follow up - Dr. Luisa Hart  Plan - Ileus resolving. Transitioning to low fat diet. Wean TPN today and d/c tomorrow if ok with GI. WBC trending down and patient afebrile x24 hours, will switch antibiotics to Augmentin x5 days. Continue to mobilize. Hopefully home over the weekend.   LOS: 13 days    Franne Forts , [facility] Surgery 02/16/2017, 8:47 AM Pager: [phone] Consults: [phone] Mon-Fri 7:00 am-4:30 pm Sat-Sun 7:00 am-11:30 am

## 2017-02-17 DIAGNOSIS — K650 Generalized (acute) peritonitis: Secondary | ICD-10-CM

## 2017-02-17 DIAGNOSIS — K56600 Partial intestinal obstruction, unspecified as to cause: Secondary | ICD-10-CM

## 2017-02-17 LAB — BASIC METABOLIC PANEL
Anion gap: 9 (ref 5–15)
BUN: 7 mg/dL (ref 6–20)
CHLORIDE: 100 mmol/L — AB (ref 101–111)
CO2: 26 mmol/L (ref 22–32)
CREATININE: 0.55 mg/dL (ref 0.44–1.00)
Calcium: 8.5 mg/dL — ABNORMAL LOW (ref 8.9–10.3)
GFR calc Af Amer: >60 mL/min (ref >60)
GFR calc non Af Amer: >60 mL/min (ref >60)
Glucose, Bld: 117 mg/dL — ABNORMAL HIGH (ref 65–99)
Potassium: 3.8 mmol/L (ref 3.5–5.1)
SODIUM: 135 mmol/L (ref 135–145)

## 2017-02-17 LAB — CBC
HEMATOCRIT: 33.1 % — AB (ref 36.0–46.0)
Hemoglobin: 10.7 g/dL — ABNORMAL LOW (ref 12.0–15.0)
MCH: 28.8 pg (ref 26.0–34.0)
MCHC: 32.3 g/dL (ref 30.0–36.0)
MCV: 89 fL (ref 78.0–100.0)
Platelets: 774 10*3/uL — ABNORMAL HIGH (ref 150–400)
RBC: 3.72 MIL/uL — ABNORMAL LOW (ref 3.87–5.11)
RDW: 13.3 % (ref 11.5–15.5)
WBC: 11.1 10*3/uL — ABNORMAL HIGH (ref 4.0–10.5)

## 2017-02-17 LAB — HEPATIC FUNCTION PANEL
ALK PHOS: 113 U/L (ref 38–126)
ALT: 421 U/L — ABNORMAL HIGH (ref 14–54)
AST: 193 U/L — ABNORMAL HIGH (ref 15–41)
Albumin: 2.5 g/dL — ABNORMAL LOW (ref 3.5–5.0)
BILIRUBIN DIRECT: 0.4 mg/dL (ref 0.1–0.5)
BILIRUBIN INDIRECT: 0.4 mg/dL (ref 0.3–0.9)
BILIRUBIN TOTAL: 0.8 mg/dL (ref 0.3–1.2)
Total Protein: 6 g/dL — ABNORMAL LOW (ref 6.5–8.1)

## 2017-02-17 LAB — CULTURE, BLOOD (SINGLE)
CULTURE: NO GROWTH
SPECIAL REQUESTS: ADEQUATE

## 2017-02-17 LAB — PHOSPHORUS: Phosphorus: 4.3 mg/dL (ref 2.5–4.6)

## 2017-02-17 LAB — GLUCOSE, CAPILLARY
GLUCOSE-CAPILLARY: 118 mg/dL — AB (ref 65–99)
Glucose-Capillary: 122 mg/dL — ABNORMAL HIGH (ref 65–99)

## 2017-02-17 NOTE — Progress Notes (Signed)
General Surgery [facility]  Assessment & Plan: Calculus of bile duct with acute cholecystitis without mention of obstruction S/p lap chole 9/10 Dr. Luisa Hart - s/p ERCP 9/11 - Post-ERCP pancreatitis - lipase WNL (9/17)  - WBC trending down, 11.1 this AM (9/22) - HIDA 9/20 negative for leak  ID - Augmentin 9/21>> (recommend 5 days). Merrem 9/18>>9/21. Rocephin/flagyl 9/8>>9/12 FEN - TPN to discontinue today, low fat diet VTE - SCDs, lovenox Follow up - Dr. Luisa Hart  Plan - Ileus resolving. Transitioning to low fat diet. Wean TPN today and discontinue per GI. Augmentin x5 days. Continue to mobilize. Hopefully home over the weekend.         Velora Heckler, MD, [facility]       Office: [phone]    Chief Complaint: Status post lap chole, post-ERCP pancreatitis  Subjective: Patient up in chair, family at bedside.  Complains of itching in hands and abd wall.  Taking some po, no nausea or emesis.  Objective: Vital signs in last 24 hours: Temp:  [98.4 F (36.9 C)-99 F (37.2 C)] 98.4 F (36.9 C) (09/22 0524) Pulse Rate:  [75-103] 75 (09/22 0524) Resp:  [18] 18 (09/22 0524) BP: (106-112)/(62-73) 112/64 (09/22 0524) SpO2:  [100 %] 100 % (09/21 2221) Last BM Date: 02/17/17  Intake/Output from previous day: 09/21 0701 - 09/22 0700 In: 1334.3 [P.O.:360; I.V.:974.3] Out: 5650 [Urine:5650] Intake/Output this shift: Total I/O In: 240 [P.O.:240] Out: 450 [Urine:450]  Physical Exam: HEENT - sclerae clear, mucous membranes moist Neck - soft Chest - clear bilaterally Cor - RRR Abdomen - protuberant, minimally tender Ext - no edema, non-tender Neuro - alert & oriented, no focal deficits  Lab Results:   Recent Labs  02/16/17 0444 02/17/17 0355  WBC 14.3* 11.1*  HGB 9.9* 10.7*  HCT 30.4* 33.1*  PLT 677* 774*   BMET  Recent Labs  02/16/17 0444 02/17/17 0355  NA 137 135  K 4.0 3.8  CL 103 100*  CO2 27 26   GLUCOSE 118* 117*  BUN 6 7  CREATININE 0.59 0.55  CALCIUM 8.1* 8.5*   PT/INR No results for input(s): LABPROT, INR in the last 72 hours. Comprehensive Metabolic Panel:    Component Value Date/Time   NA 135 02/17/2017 0355   NA 137 02/16/2017 0444   K 3.8 02/17/2017 0355   K 4.0 02/16/2017 0444   CL 100 (L) 02/17/2017 0355   CL 103 02/16/2017 0444   CO2 26 02/17/2017 0355   CO2 27 02/16/2017 0444   BUN 7 02/17/2017 0355   BUN 6 02/16/2017 0444   CREATININE 0.55 02/17/2017 0355   CREATININE 0.59 02/16/2017 0444   GLUCOSE 117 (H) 02/17/2017 0355   GLUCOSE 118 (H) 02/16/2017 0444   CALCIUM 8.5 (L) 02/17/2017 0355   CALCIUM 8.1 (L) 02/16/2017 0444   AST 193 (H) 02/17/2017 0355   AST 265 (H) 02/15/2017 0400   ALT 421 (H) 02/17/2017 0355   ALT 332 (H) 02/15/2017 0400   ALKPHOS 113 02/17/2017 0355   ALKPHOS 88 02/15/2017 0400   BILITOT 0.8 02/17/2017 0355   BILITOT 0.7 02/15/2017 0400   PROT 6.0 (L) 02/17/2017 0355   PROT 5.2 (L) 02/15/2017 0400   ALBUMIN 2.5 (L) 02/17/2017 0355   ALBUMIN 2.0 (L) 02/15/2017 0400    Studies/Results: No results found.    Sierra Hernandez M 02/17/2017  Patient ID: Sierra Hernandez, female   DOB: 10/14/1990, 26 y.o.  MRN: [id number]

## 2017-02-17 NOTE — Progress Notes (Signed)
      Progress Note   Subjective  Patient slept well. Reports tolerating low fat diet. Some mild abdominal and back pain but overall improving. Edema improving. She had a bowel movement this AM.   Objective   Vital signs in last 24 hours: Temp:  [98.4 F (36.9 C)-99 F (37.2 C)] 98.4 F (36.9 C) (09/22 0524) Pulse Rate:  [75-103] 75 (09/22 0524) Resp:  [18] 18 (09/22 0524) BP: (106-112)/(62-73) 112/64 (09/22 0524) SpO2:  [100 %] 100 % (09/21 2221) Last BM Date: 02/16/17 General:    white female in NAD Heart:  Regular rate and rhythm; no murmurs Lungs: Respirations even and unlabored, lungs CTA bilaterally Abdomen:  Soft, mild lower abdominal TTP, mildly distended. Normal bowel sounds. Extremities:  Without edema. Neurologic:  Alert and oriented,  grossly normal neurologically. Psych:  Cooperative. Normal mood and affect.  Intake/Output from previous day: 09/21 0701 - 09/22 0700 In: 1334.3 [P.O.:360; I.V.:974.3] Out: 5650 [Urine:5650] Intake/Output this shift: Total I/O In: -  Out: 200 [Urine:200]  Lab Results:  Recent Labs  02/15/17 0900 02/16/17 0444 02/17/17 0355  WBC 18.3* 14.3* 11.1*  HGB 10.6* 9.9* 10.7*  HCT 32.1* 30.4* 33.1*  PLT 764* 677* 774*   BMET  Recent Labs  02/15/17 2011 02/16/17 0444 02/17/17 0355  NA 136 137 135  K 3.9 4.0 3.8  CL 102 103 100*  CO2 GLUCOSE 113* 118* 117*  BUN CREATININE 0.57 0.59 0.55  CALCIUM 8.3* 8.1* 8.5*   LFT  Recent Labs  02/17/17 0355  PROT 6.0*  ALBUMIN 2.5*  AST 193*  ALT 421*  ALKPHOS 113  BILITOT 0.8  BILIDIR 0.4  IBILI 0.4   PT/INR No results for input(s): LABPROT, INR in the last 72 hours.  Studies/Results: No results found.     Assessment / Plan:   26 y/o female initially admitted with choledocholithiasis / cholelithiasis, underwent cholecystectomy and ERCP, unfortunately developed post-ERCP pancreatitis and peritonitis, ileus. She has had a prolonged hospital  course, has been on TPN since 9/19 but tolerating low fat diet at this time, overall improving. She has a transaminitis which is suspected to be reactive. ALT slightly up but AST downtrending today, continue to trend.    I think we can stop TPN today given she is tolerating low fat diet. Continue oral antibiotic regimen, trend LFTs, perhaps home tomorrow if she continues to do well. Spoke with patient and mother this morning about her course, and plans for recovery. We will see her in 1-2 weeks following discharge. Please call with questions.   Ileene Patrick, MD [facility] Gastroenterology Pager [phone]

## 2017-02-17 NOTE — Progress Notes (Signed)
PHARMACY - ADULT TOTAL PARENTERAL NUTRITION CONSULT NOTE   Pharmacy Consult:  TPN Indication:  Prolonged ileus / Pancreatitis  Patient Measurements: Height:  (162.6 cm) Weight: 130 lb (59 kg) IBW/kg (Calculated) : 54.7 TPN AdjBW (KG): 59 Body mass index is 22.31 kg/m.  Assessment:  65 YOF with a history of cholelithiasis and was scheduled for cholecystectomy on 02/07/17; however, her abdominal pain and emesis brought her into the ED on 02/03/17. Patient underwent cholecystectomy with cholangiogram on 02/05/17 and ERCP on 02/06/17. Post-operatively patient developed pancreatitis and ileus. Repeat CT showed intra-abdominal and pelvic fluid collection and possible bile leak. Pharmacy consulted to manage TPN.  Per patient, she was eating regularly until 3 days PTA and no significant weight loss. During hospitalization, she has been primarily NPO due to abdominal distension and vomiting.  Patient is at risk for refeeding syndrome.  GI: hx IBS / cholelithiasis. Low albumin at 2.5 and prealbumin 7.3. Pepcid is being added to TPN, Miralax. Abdominal pain improved; receiving Ensure BID (drinking one/day); tolerating full liquids; soft stool at 2 am; advancing to low fat diet and seems to be tolerating. Plan to wean TPN off today Endo: no hx DM; BG typically <150; sensitive SSI  Insulin requirements in the past 24 hours: 0 Lytes: wnl. Phos back down to 4.3. CoCa 9.5 Renal: SCr 0.59 stable, BUN wnl, UPO 1.2 L / 24 hr; NS @ 45 mL/hr Pulm: asthma - stable on RA Cards: no hx - BP controlled Hepatobil: LFT still elevated, AST trending down and ALT trending up - Tbili normal  Neuro: scoliosis - PRN Robaxin/tramadol ID: CTX >> Merrem (9/18 >> ) for peritonitis, now afebrile, WBC trending down 21.9>>18.3>>14.3  BCx NGTD x 3.  Best Practices: Lovenox  TPN Access: PICC TPN start date: 02/14/17  Nutritional Goals (from Dietician 9/19) : 1800 - 2000 kCal 85 - 100 gm protein per day  Current Nutrition:   Low fat diet Clinimix 5/15 at 40 mL/hr + IV lipid emulsions Ensure BID  Plan:  Wean off TPN today Can continue current Clinimix bag and let it finish at 1800 tonight or stop early if considering discharge today D/c all TPN labs and orders  Rx will sign off  Enzo Bi, PharmD, [facility] Clinical Pharmacist Pager [phone] 02/17/2017 8:17 AM

## 2017-02-17 NOTE — Progress Notes (Signed)
   Subjective: patient was feeling better this morning. She was tolerating low-fat diet very well, denies any nausea or vomiting. She did had a liquidy bowel movement this morning. Her abdominal pain is improving, continued to have some discomfort on right upper and lower quadrant. She denies any chest pain or shortness of breath. She was able to walk down the hall without any difficulty.  Objective:  Vital signs in last 24 hours: Vitals:   02/16/17 0611 02/16/17 1326 02/16/17 2221 02/17/17 0524  BP: (!) 106/57 106/62 112/73 112/64  Pulse: 86 83 (!) 103 75  Resp: Temp: 98.2 F (36.8 C) 99 F (37.2 C) 98.5 F (36.9 C) 98.4 F (36.9 C)  TempSrc: Oral Oral Oral Oral  SpO2: 98% 100% 100%   Weight:      Height:       Gen. Well-developed young lady, sitting comfortably in chair, in no acute distress. Chest. Decreased breath sound at right lung base.no rhonchi or wheezing. CV. Heart regular rate and rhythm. Abdomen. Mildly distended with some diffuse tenderness, bowel sounds positive. Extremities. No edema, no cyanosis, bowel sounds positive.  Assessment/Plan:   Choledocholithiasis, Cholecystitis, Post-ERCP Pancreatitis, Post-operative Ileus, Malnutrition, Post-operative peritonitis. Patient seems improving. She was tolerating a low-fat diet very well. White cell count trending down. ALT  Increasing, AST trending down. She is currently on oral Augmentin. Decreased breath sound on the right base might be due to some atelectasis and pleural effusion, patient remained completely asymptomatic, if she becomes symptomatic we will do a chest x-ray. -Stop TPN today. -Continue Augmentin for 5 more days.End date 02/21/17. -continue low-fat diet.  Hypokalemia: resolved today. -continue to monitor.  Dispo: Anticipated discharge in approximately 1-2 day(s).   Arnetha Courser, MD 02/17/2017, 11:48 AM Pager: [phone]

## 2017-02-18 LAB — COMPREHENSIVE METABOLIC PANEL
ALK PHOS: 130 U/L — AB (ref 38–126)
ALT: 298 U/L — AB (ref 14–54)
AST: 93 U/L — ABNORMAL HIGH (ref 15–41)
Albumin: 2.7 g/dL — ABNORMAL LOW (ref 3.5–5.0)
Anion gap: 8 (ref 5–15)
BILIRUBIN TOTAL: 1 mg/dL (ref 0.3–1.2)
BUN: 10 mg/dL (ref 6–20)
CALCIUM: 9.1 mg/dL (ref 8.9–10.3)
CHLORIDE: 98 mmol/L — AB (ref 101–111)
CO2: 28 mmol/L (ref 22–32)
CREATININE: 0.61 mg/dL (ref 0.44–1.00)
Glucose, Bld: 104 mg/dL — ABNORMAL HIGH (ref 65–99)
Potassium: 3.9 mmol/L (ref 3.5–5.1)
Sodium: 134 mmol/L — ABNORMAL LOW (ref 135–145)
TOTAL PROTEIN: 6.7 g/dL (ref 6.5–8.1)

## 2017-02-18 LAB — CBC
HCT: 34.5 % — ABNORMAL LOW (ref 36.0–46.0)
Hemoglobin: 11.1 g/dL — ABNORMAL LOW (ref 12.0–15.0)
MCH: 28.5 pg (ref 26.0–34.0)
MCHC: 32.2 g/dL (ref 30.0–36.0)
MCV: 88.7 fL (ref 78.0–100.0)
PLATELETS: 784 10*3/uL — AB (ref 150–400)
RBC: 3.89 MIL/uL (ref 3.87–5.11)
RDW: 13.1 % (ref 11.5–15.5)
WBC: 13.8 10*3/uL — ABNORMAL HIGH (ref 4.0–10.5)

## 2017-02-18 NOTE — Progress Notes (Signed)
12 Days Post-Op   Subjective/Chief Complaint:  Status post lap chole/ post ERCP pancreatitis   Objective: Vital signs in last 24 hours: Temp:  [98 F (36.7 C)-98.7 F (37.1 C)] 98 F (36.7 C) (09/23 0510) Pulse Rate:  [83-97] 85 (09/23 0510) Resp:  [16-18] 18 (09/23 0510) BP: (104-107)/(63-68) 105/65 (09/23 0510) SpO2:  [96 %-100 %] 96 % (09/23 0510) Last BM Date: 02/17/17  Intake/Output from previous day: 09/22 0701 - 09/23 0700 In: 260 [P.O.:240; I.V.:20] Out: 3650 [Urine:3650] Intake/Output this shift: No intake/output data recorded.  HEENT - sclerae clear, mucous membranes moist Neck - soft Chest - clear bilaterally Cor - RRR Abdomen - protuberant, minimally tender Ext - no edema, non-tender Neuro - alert & oriented, no focal deficits  Lab Results:   Recent Labs  02/17/17 0355 02/18/17 0409  WBC 11.1* 13.8*  HGB 10.7* 11.1*  HCT 33.1* 34.5*  PLT 774* 784*   BMET  Recent Labs  02/17/17 0355 02/18/17 0409  NA 135 134*  K 3.8 3.9  CL 100* 98*  CO2 26 28  GLUCOSE 117* 104*  BUN 7 10  CREATININE 0.55 0.61  CALCIUM 8.5* 9.1   PT/INR No results for input(s): LABPROT, INR in the last 72 hours. ABG No results for input(s): PHART, HCO3 in the last 72 hours.  Invalid input(s): PCO2, PO2  Studies/Results: No results found.  Anti-infectives: Anti-infectives    Start     Dose/Rate Route Frequency Ordered Stop   02/16/17 1000  amoxicillin-clavulanate (AUGMENTIN) 875-125 MG per tablet 1 tablet     1 tablet Oral Every 12 hours 02/16/17 0854 02/21/17 0959   02/13/17 1615  meropenem (MERREM) 1 g in sodium chloride 0.9 % 100 mL IVPB  Status:  Discontinued     1 g 200 mL/hr over 30 Minutes Intravenous Every 8 hours 02/13/17 1605 02/16/17 0854   02/05/17 0830  ceFAZolin (ANCEF) IVPB 2g/100 mL premix     2 g 200 mL/hr over 30 Minutes Intravenous On call to O.R. 02/05/17 2130 02/05/17 0959   02/03/17 2200  metroNIDAZOLE (FLAGYL) IVPB 500 mg  Status:   Discontinued     500 mg 100 mL/hr over 60 Minutes Intravenous Every 8 hours 02/03/17 2127 02/07/17 0603   02/03/17 2200  cefTRIAXone (ROCEPHIN) 2 g in dextrose 5 % 50 mL IVPB  Status:  Discontinued     2 g 100 mL/hr over 30 Minutes Intravenous Every 24 hours 02/03/17 2127 02/07/17 0603      Assessment/Plan: Calculus of bile duct with acute cholecystitis without mention of obstruction S/p lap chole 9/10 Dr. Luisa Hart - s/p ERCP 9/11 - Post-ERCP pancreatitis - lipase WNL (9/17)  - WBC slightly elevated today, but afebrile, feels well - HIDA 9/20 negative for leak  ID - Augmentin 9/21>>(recommend 5 days). Merrem 9/18>>9/21. Rocephin/flagyl 9/8>>9/12 FEN - TPN weaned off, low fat diet VTE - SCDs, lovenox Follow up - Dr. Luisa Hart  Plan - Tolerating low fat diet. Wean TPN today and discontinue per GI. Augmentin x5 days. Continue to mobilize. Discharge per primary team.  Follow-up in chart.  LOS: 15 days    Sierra Hernandez K. 02/18/2017

## 2017-02-18 NOTE — Progress Notes (Signed)
   Subjective: patient was feeling better once in this morning. She was complaining of intermittent itchiness, no rash. She was tolerating a low-fat diet well, denies any nausea or vomiting. She had bowel movements yesterday, none since this morning.  Objective:  Vital signs in last 24 hours: Vitals:   02/17/17 0524 02/17/17 1508 02/17/17 2153 02/18/17 0510  BP: 112/64 107/68 104/63 105/65  Pulse: 75 97 83 85  Resp: Temp: 98.4 F (36.9 C) 98 F (36.7 C) 98.7 F (37.1 C) 98 F (36.7 C)  TempSrc: Oral Oral Oral Oral  SpO2:  100% 99% 96%  Weight:      Height:       Gen. Well-developed young lady,  in no acute distress. Chest. Few right basal crackles. CV. Heart regular rate and rhythm. Abdomen. Soft. Mildly distended, mild lower abdominal tenderness, bowel sounds positive. Extremities. No edema, no cyanosis, bowel sounds positive.  Assessment/Plan:  Choledocholithiasis, Cholecystitis, Post-ERCP Pancreatitis, Post-operative Ileus, Malnutrition, Post-operative peritonitis. Clinically seems improving. White cell count slightly up, patient remained afebrile. TPN was discontinued yesterday, tolerating low fat diet very well. -we will broaden up her coverage if she becomes febrile again. -Continue Augmentin till 02/21/2017. -Continue low-fat diet. -Possible discharge later today or tomorrow morning.   Dispo: Anticipated discharge in approximately 0-1 day(s).   Arnetha Courser, MD 02/18/2017, 10:16 AM Pager: [phone]

## 2017-02-18 NOTE — Progress Notes (Signed)
      Progress Note   Subjective  Patient continues to feel improved. TPN stopped, tolerating low fat diet. Ambulating. States pain appears to be less. Passing stool. No fevers. Does have some sporadic pruritus without rash. Had insomnia last night.   Objective   Vital signs in last 24 hours: Temp:  [98 F (36.7 C)-98.7 F (37.1 C)] 98 F (36.7 C) (09/23 0510) Pulse Rate:  [83-97] 85 (09/23 0510) Resp:  [16-18] 18 (09/23 0510) BP: (104-107)/(63-68) 105/65 (09/23 0510) SpO2:  [96 %-100 %] 96 % (09/23 0510) Last BM Date: 02/17/17 General:    white female in NAD Heart:  Regular rate and rhythm; no murmurs Lungs: Respirations even and unlabored, lungs seem clear bilaterally Abdomen:  Soft, mild lower abdominal TTP, nondistended.  Extremities:  Without edema. Neurologic:  Alert and oriented,  grossly normal neurologically. Psych:  Cooperative. Normal mood and affect.  Intake/Output from previous day: 09/22 0701 - 09/23 0700 In: 260 [P.O.:240; I.V.:20] Out: 3650 [Urine:3650] Intake/Output this shift: No intake/output data recorded.  Lab Results:  Recent Labs  02/16/17 0444 02/17/17 0355 02/18/17 0409  WBC 14.3* 11.1* 13.8*  HGB 9.9* 10.7* 11.1*  HCT 30.4* 33.1* 34.5*  PLT 677* 774* 784*   BMET  Recent Labs  02/16/17 0444 02/17/17 0355 02/18/17 0409  NA 137 135 134*  K 4.0 3.8 3.9  CL 103 100* 98*  CO2 GLUCOSE 118* 117* 104*  BUN CREATININE 0.59 0.55 0.61  CALCIUM 8.1* 8.5* 9.1   LFT  Recent Labs  02/17/17 0355 02/18/17 0409  PROT 6.0* 6.7  ALBUMIN 2.5* 2.7*  AST 193* 93*  ALT 421* 298*  ALKPHOS 113 130*  BILITOT 0.8 1.0  BILIDIR 0.4  --   IBILI 0.4  --    PT/INR No results for input(s): LABPROT, INR in the last 72 hours.  Studies/Results: No results found.     Assessment / Plan:   26 y/o female initially admitted with choledocholithiasis / cholelithiasis, underwent cholecystectomy and ERCP, unfortunately developed  post-ERCP pancreatitis and peritonitis, ileus. She has had a prolonged hospital course, has been on TPN, but was stopped yesterday, now tolerating low fat diet. She has a transaminitis which was suspected to be reactive, LAEs have nicely downtrended.    WBC slightly increased today but afebrile and she is generally feeling better. Continue oral antibiotic regimen, perhaps discharge home later today versus tomorrow, will discuss with team.  Answered questions from patient and mother. We will see her in 1-2 weeks following discharge. Please call with questions.   Ileene Patrick, MD [facility] Pager [phone]

## 2017-02-19 ENCOUNTER — Other Ambulatory Visit: Payer: Self-pay | Admitting: Physician Assistant

## 2017-02-19 DIAGNOSIS — K858 Other acute pancreatitis without necrosis or infection: Secondary | ICD-10-CM

## 2017-02-19 DIAGNOSIS — K9189 Other postprocedural complications and disorders of digestive system: Secondary | ICD-10-CM

## 2017-02-19 LAB — COMPREHENSIVE METABOLIC PANEL
ALBUMIN: 2.7 g/dL — AB (ref 3.5–5.0)
ALK PHOS: 138 U/L — AB (ref 38–126)
ALT: 298 U/L — AB (ref 14–54)
AST: 141 U/L — AB (ref 15–41)
Anion gap: 10 (ref 5–15)
BILIRUBIN TOTAL: 0.8 mg/dL (ref 0.3–1.2)
BUN: 10 mg/dL (ref 6–20)
CALCIUM: 8.9 mg/dL (ref 8.9–10.3)
CO2: 26 mmol/L (ref 22–32)
CREATININE: 0.58 mg/dL (ref 0.44–1.00)
Chloride: 98 mmol/L — ABNORMAL LOW (ref 101–111)
GFR calc Af Amer: >60 mL/min (ref >60)
GLUCOSE: 100 mg/dL — AB (ref 65–99)
Potassium: 4.1 mmol/L (ref 3.5–5.1)
Sodium: 134 mmol/L — ABNORMAL LOW (ref 135–145)
TOTAL PROTEIN: 6.7 g/dL (ref 6.5–8.1)

## 2017-02-19 LAB — CBC
HEMATOCRIT: 34.4 % — AB (ref 36.0–46.0)
HEMOGLOBIN: 11.1 g/dL — AB (ref 12.0–15.0)
MCH: 28.8 pg (ref 26.0–34.0)
MCHC: 32.3 g/dL (ref 30.0–36.0)
MCV: 89.1 fL (ref 78.0–100.0)
Platelets: 784 10*3/uL — ABNORMAL HIGH (ref 150–400)
RBC: 3.86 MIL/uL — ABNORMAL LOW (ref 3.87–5.11)
RDW: 13.1 % (ref 11.5–15.5)
WBC: 13.1 10*3/uL — AB (ref 4.0–10.5)

## 2017-02-19 MED ORDER — AMOXICILLIN-POT CLAVULANATE 875-125 MG PO TABS: 1.0000 | ORAL_TABLET | Freq: Two times a day (BID) | ORAL | 0 refills | Status: AC

## 2017-02-19 NOTE — Progress Notes (Signed)
13 Days Post-Op  Subjective: Status post laparoscopic cholecystectomy and subsequent ERCP pancreatitis Still has a little pain but better Tolerating regular diet Having bowel movements Begging to go home  No fever or tachycardia.  WBC still 13,000.  Not sure why. Transaminases still slightly elevated.  Total bilirubin 0.8.    Objective: Vital signs in last 24 hours: Temp:  [98.1 F (36.7 C)-99 F (37.2 C)] 98.1 F (36.7 C) (09/24 0500) Pulse Rate:  [75-79] 75 (09/24 0500) Resp:  [16-17] 17 (09/24 0500) BP: (103-113)/(65-68) 113/68 (09/24 0500) SpO2:  [99 %-100 %] 99 % (09/24 0500) Last BM Date: 02/17/17  Intake/Output from previous day: 09/23 0701 - 09/24 0700 In: 20 [I.V.:20] Out: 1975 [Urine:1975] Intake/Output this shift: No intake/output data recorded.   eyes-sclera clear. Chest - clear bilaterally Cor - RRR Abdomen - does not seem distended.  Bowel sounds present.  Wounds clean. minimally tender Ext - no edema, non-tender Neuro - alert & oriented,    Lab Results:  Results for orders placed or performed during the hospital encounter of 02/03/17 (from the past 24 hour(s))  CBC     Status: Abnormal   Collection Time: 02/19/17  4:17 AM  Result Value Ref Range   WBC 13.1 (H) 4.0 - 10.5 K/uL   RBC 3.86 (L) 3.87 - 5.11 MIL/uL   Hemoglobin 11.1 (L) 12.0 - 15.0 g/dL   HCT 08.6 (L) 57.8 - 46.9 %   MCV 89.1 78.0 - 100.0 fL   MCH 28.8 26.0 - 34.0 pg   MCHC 32.3 30.0 - 36.0 g/dL   RDW 62.9 52.8 - 41.3 %   Platelets 784 (H) 150 - 400 K/uL  Comprehensive metabolic panel     Status: Abnormal   Collection Time: 02/19/17  4:17 AM  Result Value Ref Range   Sodium 134 (L) 135 - 145 mmol/L   Potassium 4.1 3.5 - 5.1 mmol/L   Chloride 98 (L) 101 - 111 mmol/L   CO2 26 22 - 32 mmol/L   Glucose, Bld 100 (H) 65 - 99 mg/dL   BUN 10 6 - 20 mg/dL   Creatinine, Ser 2.44 0.44 - 1.00 mg/dL   Calcium 8.9 8.9 - 01.0 mg/dL   Total Protein 6.7 6.5 - 8.1 g/dL   Albumin 2.7 (L) 3.5 -  5.0 g/dL   AST 272 (H) 15 - 41 U/L   ALT 298 (H) 14 - 54 U/L   Alkaline Phosphatase 138 (H) 38 - 126 U/L   Total Bilirubin 0.8 0.3 - 1.2 mg/dL   GFR calc non Af Amer >60 >60 mL/min   GFR calc Af Amer >60 >60 mL/min   Anion gap 10 5 - 15     Studies/Results: No results found.  Marland Kitchen amoxicillin-clavulanate  1 tablet Oral Q12H  . enoxaparin (LOVENOX) injection  40 mg Subcutaneous Q24H  . feeding supplement (ENSURE ENLIVE)  237 mL Oral BID BM  . polyethylene glycol  17 g Oral Daily  . sodium chloride flush  10-40 mL Intracatheter Q12H     Assessment/Plan: s/p Procedure(s): ENDOSCOPIC RETROGRADE CHOLANGIOPANCREATOGRAPHY (ERCP)  Calculus of bile duct with acute cholecystitis without mention of obstruction S/p lap chole 9/10 Dr. Luisa Hart - s/p ERCP 9/11 - Post-ERCP pancreatitis - lipase WNL (9/17)  - WBC slightly elevated today, but afebrile, feels well - HIDA 9/20 negative for leak  ID - Augmentin 9/21>>(recommend 5 days). Merrem 9/18>>9/21. Rocephin/flagyl 9/8>>9/12 FEN - TPN weaned off, low fat diet VTE - SCDs, lovenox Follow up -  Dr. Luisa Hart  Plan - considering that she is doing very well clinically, I think she may be discharged home today           -Discussed with internal medicine teaching service and they will handle discharge           -Complete 5 days of Augmentin           -Diet and activities discussed.  Low-fat diet Tolerating low fat diet. .           -Discharge per primary team.  Follow-up in chart. Dr. Luisa Hart 10/5.   @  LOS: 16 days    Ryane Konieczny M 02/19/2017  . .prob

## 2017-02-19 NOTE — Progress Notes (Signed)
          Daily Rounding Note  02/19/2017, 8:22 AM  LOS: 16 days   SUBJECTIVE:   Chief complaint: some lingering lower abdominal and right sided pain, discomfort.  Stools soft, formed.  Ready and wants to go home.  Eating the bulk of her low fat meals.        OBJECTIVE:         Vital signs in last 24 hours:    Temp:  [98.1 F (36.7 C)-99 F (37.2 C)] 98.1 F (36.7 C) (09/24 0500) Pulse Rate:  [75-79] 75 (09/24 0500) Resp:  [16-17] 17 (09/24 0500) BP: (103-113)/(65-68) 113/68 (09/24 0500) SpO2:  [99 %-100 %] 99 % (09/24 0500) Last BM Date: 02/17/17 Filed Weights   02/04/17 0600 02/06/17 1217 02/13/17 1900  Weight: 59 kg (130 lb 1.6 oz) 59 kg (130 lb) 55.7 kg (122 lb 12.8 oz)   General: looks well.  Comfortable.     Heart: RRR Chest: clear bil.  No cough or labored breathing Abdomen: soft, slightly protuberant, active BS.  Minor lower abdominal and right upper abdominal  Pain.    Extremities: no CCE Neuro/Psych:  Oriented x 3.  Calm.  No deficits, tremors or weakness.   Intake/Output from previous day: 09/23 0701 - 09/24 0700 In: 20 [I.V.:20] Out: 1975 [Urine:1975]  Intake/Output this shift: No intake/output data recorded.  Lab Results:  Recent Labs  02/17/17 0355 02/18/17 0409 02/19/17 0417  WBC 11.1* 13.8* 13.1*  HGB 10.7* 11.1* 11.1*  HCT 33.1* 34.5* 34.4*  PLT 774* 784* 784*   BMET  Recent Labs  02/17/17 0355 02/18/17 0409 02/19/17 0417  NA 135 134* 134*  K 3.8 3.9 4.1  CL 100* 98* 98*  CO2 _0 GLUCOSE 117* 104* 100*  BUN _1 CREATININE 0.55 0.61 0.58  CALCIUM 8.5* 9.1 8.9   LFT  Recent Labs  02/17/17 0355 02/18/17 0409 02/19/17 0417  PROT 6.0* 6.7 6.7  ALBUMIN 2.5* 2.7* 2.7*  AST 193* 93* 141*  ALT 421* 298* 298*  ALKPHOS 113 130* 138*  BILITOT 0.8 1.0 0.8  BILIDIR 0.4  --   --   IBILI 0.4  --   --    PT/INR No results for input(s): LABPROT, INR in the last 72  hours. Hepatitis Panel No results for input(s): HEPBSAG, HCVAB, HEPAIGM, HEPBIGM in the last 72 hours.  Studies/Results: No results found.  ASSESMENT:   *  Post op peritonitis.  Prolonged recovery but improved.  Afebrile x 4 plus days.  Abx day 6.  Meropenem x 3 days >> Augmentin 9/21-present.  Plan per surgery for 5 total days of Augmentin.   TPN discontinued 9/22.    *  Post ERCP (02/06/17) pancreatitis, pancreas normal appearing on CT 9/18.  Fluctutating LFTs.  Alk phos and AST up.  ALT stable.  T bili normal x 5 days.   *  Lap chole 02/05/17.     PLAN   *  Agree with gen surg rec for discharge.  Pt has fup appt with GI APP on 10/4.  Orders placed for CMET a day or 2 before appt.      Azucena Freed  02/19/2017, 8:22 AM Pager: [phone]

## 2017-02-19 NOTE — Progress Notes (Signed)
Patient discharge teaching given, including activity, diet, follow-up appoints, and medications. Patient verbalized understanding of all discharge instructions. Vitals are stable. Skin is intact except as charted in most recent assessments. Discharge awaiting removal of PICC line.  Pt to be escorted out by NT, to be driven home by family.

## 2017-02-19 NOTE — Progress Notes (Signed)
   Subjective: Patient is feeling well this morning. States she has had some difficulty sleeping over the past couple of nights and some intermittent pruritis, which she attributes to her antibiotic. She is tolerating a low-fat diet well, denies any nausea or vomiting. She had a soft, semi-formed BM yesterday morning. She states she feels ready to go home.   Objective:  Vital signs in last 24 hours: Vitals:   02/18/17 0510 02/18/17 1437 02/18/17 2116 02/19/17 0500  BP: 105/65 103/68 109/65 113/68  Pulse: 85 79 79 75  Resp: Temp: 98 F (36.7 C) 98.4 F (36.9 C) 99 F (37.2 C) 98.1 F (36.7 C)  TempSrc: Oral Oral Oral Oral  SpO2: 96% 100% 99% 99%  Weight:      Height:       Gen. Well-developed young lady,  in no acute distress. Chest. Lung sounds normal.  CV. Heart regular rate and rhythm. Abdomen. Soft. Mildly distended, mild lower abdominal tenderness to palpation, bowel sounds positive. Extremities. No edema, no cyanosis, bowel sounds positive.  Assessment/Plan:  Choledocholithiasis, Cholecystitis, Post-ERCP Pancreatitis, Post-operative Ileus, Malnutrition, Post-operative peritonitis. Clinically improving. WBC remains slightly elevated at 13.1, but is trending down and patient has remained afebrile. Tolerating low fat diet well. -Continue Augmentin till 02/21/2017. -Continue low-fat diet   Dispo: Anticipated discharge in approximately 0-1 day(s).   Beola Cord, MD 02/19/2017, 8:19 AM Pager: [phone]

## 2017-02-23 ENCOUNTER — Telehealth: Payer: Self-pay | Admitting: Internal Medicine

## 2017-02-23 NOTE — Telephone Encounter (Signed)
Left message for patient to call back  

## 2017-02-23 NOTE — Telephone Encounter (Signed)
Patient with recent ERCP for choledocholithiasis on 02/06/17.

## 2017-02-23 NOTE — Telephone Encounter (Signed)
Patient states she would like to speak with nurse about symptoms she is having. Pt saw Dr.Danis and Dr.Perry in hosp but Dr.Perry was supervising doc for ov with Gunnar Fusi on 12/05/16. Pt states her skin has been itching and she doesn't know if it's from medication or not.

## 2017-02-23 NOTE — Telephone Encounter (Signed)
Patient with itching since last week. She was discharged on Augmenting and completed the RX on Wed.  She thought the itching might be related to the augmentin.  Patient denies jaundice, nausea, vomiting, abdominal pain.  Discussed with [name] that performed her ERCP and he ordered benadryl 25 mg as needed for itching.  She is asked to try this over the weekend and she will call back next week if the itching continues.

## 2017-02-26 ENCOUNTER — Telehealth: Payer: Self-pay | Admitting: Internal Medicine

## 2017-02-26 DIAGNOSIS — R7989 Other specified abnormal findings of blood chemistry: Secondary | ICD-10-CM

## 2017-02-26 DIAGNOSIS — R945 Abnormal results of liver function studies: Principal | ICD-10-CM

## 2017-02-26 NOTE — Telephone Encounter (Signed)
Sierra Hernandez, this is [name] primary GI patient. I picked her up in the hospital after he rotated off service. I will forward this note to him, such that he may advise

## 2017-02-26 NOTE — Telephone Encounter (Signed)
Pt was released from the hospital last week. She started having itching on her hands, feet and legs on Friday. She called the office and was told to try Benadryl and call back if still itching this week. Pt calling stating the Benadryl really did not help. Reports she is still itching all over, the intensity is a little less with benadryl. She states her eyes are not yellow, skin looks the same, no nausea or vomiting. Pt states on Friday she started having cramping abdominal pain in the center of her abdomen right above her belly button. States it comes and goes. Pt has been taking zyrtec in the am and benadryl at bedtime but reports she is still having itching problems. Please advise.  Note    Patient with itching since last week. She was discharged on Augmenting and completed the RX on Wed.  She thought the itching might be related to the augmentin.  Patient denies jaundice, nausea, vomiting, abdominal pain.  Discussed with [name] that performed her ERCP and he ordered benadryl 25 mg as needed for itching.  She is asked to try this over the weekend and she will call back next week if the itching continues.

## 2017-02-26 NOTE — Telephone Encounter (Signed)
Sierra Hernandez, Your right. Sorry for the bother. Agree with your recommendation. I will send this along to [facility].

## 2017-02-26 NOTE — Telephone Encounter (Signed)
Dr Danis please advise  

## 2017-02-26 NOTE — Telephone Encounter (Signed)
John,   As it happens, she is your patient from when you were supervising during initial clinic visit. I fielded this call while you were off Friday.  Lfts were normal. Itching seems unlikely to be GI-related, and perhaps a primary care appointment would be appropriate.

## 2017-02-26 NOTE — Telephone Encounter (Signed)
Pt was concerned that last time she had itching her LFT's were elevated. Per Dr. Marina Goodell repeat labs to check LFT's. Pt to come for labs, orders in epic. Pt knows to keep her follow-up appt.

## 2017-02-27 ENCOUNTER — Other Ambulatory Visit (INDEPENDENT_AMBULATORY_CARE_PROVIDER_SITE_OTHER): Payer: Managed Care, Other (non HMO)

## 2017-02-27 DIAGNOSIS — R945 Abnormal results of liver function studies: Secondary | ICD-10-CM | POA: Diagnosis not present

## 2017-02-27 DIAGNOSIS — R7989 Other specified abnormal findings of blood chemistry: Secondary | ICD-10-CM

## 2017-02-27 LAB — COMPREHENSIVE METABOLIC PANEL
ALBUMIN: 3.9 g/dL (ref 3.5–5.2)
ALK PHOS: 111 U/L (ref 39–117)
ALT: 527 U/L — AB (ref 0–35)
AST: 203 U/L — ABNORMAL HIGH (ref 0–37)
BILIRUBIN TOTAL: 1 mg/dL (ref 0.2–1.2)
BUN: 11 mg/dL (ref 6–23)
CO2: 30 mEq/L (ref 19–32)
Calcium: 9.8 mg/dL (ref 8.4–10.5)
Chloride: 99 mEq/L (ref 96–112)
Creatinine, Ser: 0.54 mg/dL (ref 0.40–1.20)
GFR: 146.43 mL/min (ref >60.00)
Glucose, Bld: 84 mg/dL (ref 70–99)
POTASSIUM: 4.2 meq/L (ref 3.5–5.1)
SODIUM: 136 meq/L (ref 135–145)
TOTAL PROTEIN: 7.8 g/dL (ref 6.0–8.3)

## 2017-02-28 ENCOUNTER — Other Ambulatory Visit: Payer: Self-pay

## 2017-02-28 DIAGNOSIS — R945 Abnormal results of liver function studies: Principal | ICD-10-CM

## 2017-02-28 DIAGNOSIS — R7989 Other specified abnormal findings of blood chemistry: Secondary | ICD-10-CM

## 2017-03-01 ENCOUNTER — Other Ambulatory Visit (INDEPENDENT_AMBULATORY_CARE_PROVIDER_SITE_OTHER): Payer: Managed Care, Other (non HMO)

## 2017-03-01 ENCOUNTER — Encounter: Payer: Self-pay | Admitting: Physician Assistant

## 2017-03-01 ENCOUNTER — Ambulatory Visit (INDEPENDENT_AMBULATORY_CARE_PROVIDER_SITE_OTHER): Payer: Managed Care, Other (non HMO) | Admitting: Physician Assistant

## 2017-03-01 VITALS — BP 90/48 | HR 80 | Ht 64.0 in | Wt 117.6 lb

## 2017-03-01 DIAGNOSIS — D72829 Elevated white blood cell count, unspecified: Secondary | ICD-10-CM

## 2017-03-01 DIAGNOSIS — K858 Other acute pancreatitis without necrosis or infection: Secondary | ICD-10-CM | POA: Diagnosis not present

## 2017-03-01 DIAGNOSIS — R109 Unspecified abdominal pain: Secondary | ICD-10-CM | POA: Diagnosis not present

## 2017-03-01 DIAGNOSIS — K859 Acute pancreatitis without necrosis or infection, unspecified: Secondary | ICD-10-CM

## 2017-03-01 DIAGNOSIS — R945 Abnormal results of liver function studies: Secondary | ICD-10-CM

## 2017-03-01 DIAGNOSIS — K588 Other irritable bowel syndrome: Secondary | ICD-10-CM | POA: Diagnosis not present

## 2017-03-01 DIAGNOSIS — R7989 Other specified abnormal findings of blood chemistry: Secondary | ICD-10-CM

## 2017-03-01 LAB — CBC WITH DIFFERENTIAL/PLATELET
BASOS ABS: 0.1 10*3/uL (ref 0.0–0.1)
Basophils Relative: 1 % (ref 0.0–3.0)
Eosinophils Absolute: 0.2 10*3/uL (ref 0.0–0.7)
Eosinophils Relative: 2.9 % (ref 0.0–5.0)
HCT: 34.3 % — ABNORMAL LOW (ref 36.0–46.0)
HEMOGLOBIN: 11.3 g/dL — AB (ref 12.0–15.0)
LYMPHS ABS: 1.9 10*3/uL (ref 0.7–4.0)
Lymphocytes Relative: 31.2 % (ref 12.0–46.0)
MCHC: 33.1 g/dL (ref 30.0–36.0)
MCV: 89.9 fl (ref 78.0–100.0)
MONO ABS: 0.5 10*3/uL (ref 0.1–1.0)
MONOS PCT: 8.6 % (ref 3.0–12.0)
NEUTROS PCT: 56.3 % (ref 43.0–77.0)
Neutro Abs: 3.5 10*3/uL (ref 1.4–7.7)
Platelets: 460 10*3/uL — ABNORMAL HIGH (ref 150.0–400.0)
RBC: 3.82 Mil/uL — AB (ref 3.87–5.11)
RDW: 13.5 % (ref 11.5–15.5)
WBC: 6.2 10*3/uL (ref 4.0–10.5)

## 2017-03-01 MED ORDER — HYOSCYAMINE SULFATE 0.125 MG SL SUBL: 0.1250 mg | SUBLINGUAL_TABLET | SUBLINGUAL | 1 refills | Status: DC | PRN

## 2017-03-01 NOTE — Patient Instructions (Signed)
Please purchase the following medications over the counter and take as directed:  Align probiotic once daily  We have sent the following medications to your pharmacy for you to pick up at your convenience: Hyoscyamine 0.125 mg every 4-6 hours as needed.   Your physician has requested that you go to the basement for the following lab work before leaving today: CBC

## 2017-03-01 NOTE — Progress Notes (Addendum)
Chief Complaint: Hospital follow-up for post-ERCP pancreatitis  HPI:  Sierra Hernandez is a 26 year old Caucasian female with a past medical history as listed below including a complicated post ERCP pancreatitis for which she was in the hospital. She is here today for follow-up.   Patient was admitted to the hospital 02/03/17 and discharged 02/19/17. She had cholelithiasis with choledocholithiasis and underwent ERCP with complication of post-ERCP pancreatitis and peritonitis. The patient was treated with IV fluids, bowel rest and antiemetics as well as pain control. Patient also experienced edema due to her IVF including uncomfortable labial swelling. She later developed a fever (T maximum 102). Patient had a CT done and pelvis performed to rule out necrosis or other problems. This scan showed diffuse abdominal pelvic ascites with scattered interloop abdominopelvic fluid collections and diffuse peritoneal and mesenteric enhancement compatible with postoperative peritonitis possibly related to a bile leak. Meropenem was started and a hepatobiliary scan was ordered. This showed no evidence of bile leak. Patient was started on TPN due to 10-11 days of minimal oral intake and recurrence of ileus on CT. After couple of days on this therapy the patient's white count began to trend down, her fever stopped spiking and she clinically improved. Meropenem was transitioned to Augmentin and her diet was advanced as tolerated to a low-fat heart healthy diet. She was discharged on Augmentin.    Patient called our office after discharge and discussed symptoms of itching on her feet and hands, thought possibly related to the Augmentin. She stopped the Augmentin on Wednesday 02/21/17. She was also using Benadryl when necessary.    Today, the patient presents to clinic accompanied by her mother who is still very concerned. She brings with her list of questions which she wishes to go over. Initially, the patient discusses a cramping  pain which lasts 5-15 seconds and occurs off and on throughout the day. This is typically in her epigastrium. This does not seem to be related to eating or positioning. In fact, on Monday this was so painful that mostly she just laid around on the couch. Since that day she has felt somewhat better from this cramping although it does continue to occur. Patient is now's trying to increase her diet slowly from just low fat.     She describes her stools are slightly looser than normal but are still formed. Patient is using MiraLAX a half a dose in the morning and a half a dose at night. She discusses a history of IBS diagnosed when she was young child and having to use MiraLAX in the past. Patient does describe a lower abdominal fullness/bloating which she is noted from.   Patient also discusses itching on her hands and feet. Apparently, this is worse at night if she has to get up to the bathroom. She did stop her Augmentin Wednesday of last week when she was experiencing the worst of her itching and since then things are somewhat better. She is continuing Benadryl though she does not see much relief with this.   Patient also describes burping a lot since getting out of the hospital. She tends to burp after eating almost anything. She does not remember doing this before. She denies any reflux or burning sensation in her epigastrium.    Patient's mother is concerned regarding patient's white count, this was still elevated at time of discharge and she would like to make sure that this is better at this time. Patient's mother also has multiple other questions regarding IBS symptoms,  imaging done in the hospital, the use of pancreatic enzymes and others.    Patient's social history is positive for trying to return to work next Monday. She does work as a Sport and exercise psychologist. Patient tells me she has paperwork which needed to be signed but forgot to bring this with her.   Patient denies continued fevers, chills, blood in her  stool, melena, continued weight loss, abdominal pain, nausea, vomiting, heartburn, reflux or symptoms that awaken her at night.  Past Medical History:  Diagnosis Date  . Asthma   . Elevated LFTs   . Gallstones   . IBS (irritable bowel syndrome)   . Scoliosis     Past Surgical History:  Procedure Laterality Date  . CHOLECYSTECTOMY N/A 02/05/2017   Procedure: LAPAROSCOPIC CHOLECYSTECTOMY WITH INTRAOPERATIVE CHOLANGIOGRAM;  Surgeon: Harriette Bouillon, MD;  Location: [facility] OR;  Service: General;  Laterality: N/A;  . ERCP N/A 02/06/2017   Procedure: ENDOSCOPIC RETROGRADE CHOLANGIOPANCREATOGRAPHY (ERCP);  Surgeon: Sherrilyn Rist, MD;  Location: [facility] ENDOSCOPY;  Service: Gastroenterology;  Laterality: N/A;  . MOLE REMOVAL     x 2 on back    Current Outpatient Prescriptions  Medication Sig Dispense Refill  . cetirizine (ZYRTEC) 10 MG tablet Take 10 mg by mouth at bedtime.     Marland Kitchen EPINEPHrine (EPIPEN 2-PAK) 0.3 mg/0.3 mL IJ SOAJ injection Inject 0.3 mg into the muscle once as needed (severe allergic reaction).    . fluticasone (FLONASE) 50 MCG/ACT nasal spray Place 1 spray into both nostrils daily as needed (seasonal allergies).     Marland Kitchen ibuprofen (ADVIL,MOTRIN) 200 MG tablet Take 200 mg by mouth 2 (two) times daily as needed for headache (pain).    . Multiple Vitamin (MULTIVITAMIN WITH MINERALS) TABS tablet Take 1 tablet by mouth daily.    . naproxen sodium (ALEVE) 220 MG tablet Take 220-440 mg by mouth 2 (two) times daily as needed (pain/headache).     . norgestimate-ethinyl estradiol (ORTHO-CYCLEN,SPRINTEC,PREVIFEM) 0.25-35 MG-MCG tablet Take 1 tablet by mouth at bedtime.     . polyethylene glycol (MIRALAX / GLYCOLAX) packet Take 8.5 g by mouth daily as needed (constipation). Mix in 8 oz liquid and drink    . hyoscyamine (LEVSIN SL) 0.125 MG SL tablet Place 1 tablet (0.125 mg total) under the tongue every 4 (four) hours as needed. 30 tablet 1   No current facility-administered medications for this  visit.     Allergies as of 03/01/2017 - Review Complete 03/01/2017  Allergen Reaction Noted  . Augmentin [amoxicillin-pot clavulanate]  03/01/2017  . Minocycline Swelling and Other (See Comments) 12/05/2016    Family History  Problem Relation Age of Onset  . Melanoma Mother   . Hyperlipidemia Father   . Heart disease Maternal Grandmother   . Arthritis Maternal Grandmother   . Gallstones Maternal Grandfather   . Arthritis Paternal Grandmother   . Thyroid cancer Paternal Grandfather   . Colon cancer Neg Hx   . Stomach cancer Neg Hx   . Pancreatic cancer Neg Hx   . Liver disease Neg Hx     Social History   Social History  . Marital status: Single    Spouse name: N/A  . Number of children: N/A  . Years of education: N/A   Occupational History  . Not on file.   Social History Main Topics  . Smoking status: Never Smoker  . Smokeless tobacco: Never Used  . Alcohol use Yes     Comment: ~2x per month  . Drug use:  No  . Sexual activity: Not on file   Other Topics Concern  . Not on file   Social History Narrative  . No narrative on file    Review of Systems:    Constitutional: No weight loss, fever or chills Cardiovascular: No chest pain Respiratory: No SOB  Gastrointestinal: See HPI and otherwise negative   Physical Exam:  Vital signs: BP (!) 90/48   Pulse 80   Ht 5\' 4"  (1.626 m)   Wt 117 lb 9.6 oz (53.3 kg)   LMP 02/09/2017   BMI 20.19 kg/m   Constitutional:   Pleasant Caucasian female appears to be in NAD, Well developed, Well nourished, alert and cooperative Head:  Normocephalic and atraumatic. Eyes:   PEERL, EOMI. No icterus. Conjunctiva pink. Ears:  Normal auditory acuity. Neck:  Supple Throat: Oral cavity and pharynx without inflammation, swelling or lesion.  Respiratory: Respirations even and unlabored. Lungs clear to auscultation bilaterally.   No wheezes, crackles, or rhonchi.  Cardiovascular: Normal S1, S2. No MRG. Regular rate and rhythm. No  peripheral edema, cyanosis or pallor.  Gastrointestinal:  Soft, nondistended, nontender. No rebound or guarding. Normal bowel sounds. No appreciable masses or hepatomegaly. Rectal:  Not performed.  Msk:  Symmetrical without gross deformities. Without edema, no deformity or joint abnormality.  Neurologic:  Alert and  oriented x4;  grossly normal neurologically.  Skin:   Dry and intact without significant lesions or rashes. Psychiatric: Demonstrates good judgement and reason without abnormal affect or behaviors.  MOST RECENT LABS AND IMAGING: CBC    Component Value Date/Time   WBC 6.2 03/01/2017 1044   RBC 3.82 (L) 03/01/2017 1044   HGB 11.3 (L) 03/01/2017 1044   HCT 34.3 (L) 03/01/2017 1044   PLT 460.0 (H) 03/01/2017 1044   MCV 89.9 03/01/2017 1044   MCH 28.8 02/19/2017 0417   MCHC 33.1 03/01/2017 1044   RDW 13.5 03/01/2017 1044   LYMPHSABS 1.9 03/01/2017 1044   MONOABS 0.5 03/01/2017 1044   EOSABS 0.2 03/01/2017 1044   BASOSABS 0.1 03/01/2017 1044    CMP     Component Value Date/Time   NA 136 02/27/2017 1354   K 4.2 02/27/2017 1354   CL 99 02/27/2017 1354   CO2 30 02/27/2017 1354   GLUCOSE 84 02/27/2017 1354   BUN 11 02/27/2017 1354   CREATININE 0.54 02/27/2017 1354   CALCIUM 9.8 02/27/2017 1354   PROT 7.8 02/27/2017 1354   ALBUMIN 3.9 02/27/2017 1354   AST 203 (H) 02/27/2017 1354   ALT 527 (H) 02/27/2017 1354   ALKPHOS 111 02/27/2017 1354   BILITOT 1.0 02/27/2017 1354   GFRNONAA >60 02/19/2017 0417   GFRAA >60 02/19/2017 0417   EXAM: CT ABDOMEN AND PELVIS WITH CONTRAST  TECHNIQUE: Multidetector CT imaging of the abdomen and pelvis was performed using the standard protocol following bolus administration of intravenous contrast.  CONTRAST:  30mL ISOVUE-300 IOPAMIDOL (ISOVUE-300) INJECTION 61%, ISOVUE-300 IOPAMIDOL (ISOVUE-300) INJECTION 61%  COMPARISON:  02/04/2017, 02/05/2017  FINDINGS: Lower chest: Small pleural effusions bilaterally with left  basilar atelectasis. Normal heart size. No pericardial effusion.  Hepatobiliary: Cholecystectomy noted. Mild diffuse biliary prominence without obstruction. Focal fatty infiltration of the liver along the falciform ligament. No other focal hepatic abnormality.  Small amount of upper abdominal ascites about the anterior liver and spleen. Diffuse edema throughout the mesentery and peritoneum. Small scattered interloop fluid collections throughout the abdomen and pelvis, 1 measuring 2.2 cm in the right abdomen, image 50. Scattered anterior abdominal locules  of free air related to the recent surgery.  In the lower abdomen and extending into the pelvis, there is ill-defined peripherally enhancing fluid along the right pericolic gutter and psoas muscle roughly measuring 2 x 6.7 cm, image 60. Difficult to exclude developing abscess.  Further into the pelvis, there is free fluid superior to the bladder and dependent cul-de-sac fluid as well. Diffuse peritoneal enhancement noted throughout the pelvis.  The appearance is concerning for postoperative diffuse peritonitis with areas of fluid collections concerning for developing abscess throughout the abdomen and pelvis. Fluid over the liver raises the concern for a bile leak / bile peritonitis. Consider further evaluation with nuclear medicine hepatobiliary scan.  Pancreas: Unremarkable. No pancreatic ductal dilatation or surrounding inflammatory changes.  Spleen: Normal in size without focal abnormality.  Adrenals/Urinary Tract: Adrenal glands are unremarkable. Kidneys are normal, without renal calculi, focal lesion, or hydronephrosis. Bladder is unremarkable.  Stomach/Bowel: Mild distention of the small bowel with scattered air-fluid levels. Small bowel loops are dilated up to 4.3 cm in the left upper quadrant. Distal small bowel and colon are also fluid and air filled. Appearance is suggestive of postoperative ileus  rather than developing obstruction.  Vascular/Lymphatic: No significant vascular findings are present. No enlarged abdominal or pelvic lymph nodes.  Reproductive: Uterus and bilateral adnexa are unremarkable.  Other: No hernia evident. Mild anasarca of the lower abdomen and hip regions.  Musculoskeletal: No acute osseous finding.  IMPRESSION: Status post cholecystectomy. Diffuse abdominal and pelvic ascites, scattered interloop abdominopelvic fluid collections, and diffuse peritoneal and mesenteric enhancement. Findings compatible with postoperative peritonitis possibly related to a bile leak. Consider further evaluation with hepatobiliary scan.  Small scattered locules of free air, compatible with a recent laparoscopic surgery.  Nonspecific small bowel dilatation, favor postoperative ileus over obstruction   Electronically Signed   By: Judie Petit.  Shick M.D.   On: 02/13/2017 14:31  EXAM: NUCLEAR MEDICINE HEPATOBILIARY IMAGING  TECHNIQUE: Sequential images of the abdomen were obtained out to 60 minutes following intravenous administration of radiopharmaceutical.  RADIOPHARMACEUTICALS:  5.1 mCi Tc-71m  Choletec IV  COMPARISON:  CT 02/13/2017.  ERCP 02/06/2014.  FINDINGS: Initial images demonstrate homogeneous hepatic activity with prompt opacification of the biliary system. There is spontaneous drainage into the small bowel. Transient/ fluctuating activity projecting inferior to the left hepatic lobe is most consistent with reflux of activity into the stomach. There is no persistent or progressive extraluminal activity to suggest a bile leak.  IMPRESSION: 1. No evidence of bile leak or biliary obstruction status post cholecystectomy. 2. Transient reflux of duodenal activity into the stomach.   Electronically Signed   By: Carey Bullocks M.D.   On: 02/14/2017 14:18   Assessment: 1. Post ERCP Pancreatitis: Patient is doing well today after  pancreatitis, no further fluid buildup, no nausea or vomiting, occasional epigastric pains which is thought related to the intestinal cramping rather than complications from pancreatitis 2. Elevated LFTs: Labs yesterday continue to show an elevated AST and ALT, 203 and 527 respectively, this is increased from 10 days ago in the hospital when AST was 141 and ALT was 298, this is suspected to be a drug reaction after recent Meropenem and Augmentin, we will recheck in one week 3. IBS: The patient does maintain regular bowel movements on MiraLAX half dose a.m. and p.m., does stay somewhat bloated in her lower abdomen throughout the day 4. Leukocytosis: White count 13.8 11 days ago and 13.1 10 days ago, this has not been rechecked since patient left  the hospital though I suspect is normal  Plan: 1. Spent a long time answering the patient and her mother's questions today. 2. Will recheck CBC as mother is concerned about white count. 3. Ordered Hyoscyamine 0.125 mg sublingual tabs every 4-6 hours as needed for abdominal cramping 4. Recommend the patient start a daily probiotic such as Align. She should take this once daily for least 2 months. Provided her with a coupon. 5. Spent time going over the patient's labs and imaging with the patient and her mother. 6. Discussed elevated liver enzymes today. This is thought to be a drug reaction. We will recheck this in a week, if they are still elevated at that time will discuss further imaging versus labs 7. Discussed IBS with the patient and her mother, apparently the patient has a history of this long ago and they are questioning if anything else needs to be done for this in the future. Recommended that we follow-up with this complaint in 3-4 months when patient is feeling better after acute episode of pancreatitis and hospitalization. It may be that she is experiencing some postinfectious IBS symptoms at this time which may be relieved over the next few months. 8.  Offered a lot of reassurance. 9. Patient has follow-up with surgery tomorrow. 10. Recommend the patient use MiraLAX a full dose in the morning to see how this makes her feel, it may be that this will help to completely empty her bowels and get rid of her lower abdominal fullness 11. Patient to follow with Dr. Marina Goodell only in 4 weeks.  Over an hour was spent in consultation and coordination of care for this patient today. Greater than 50% of this was spent in counseling and education.  Hyacinth Meeker, PA-C [facility] 03/01/2017, 11:33 AM  Cc: No ref. provider found   GI ATTENDING  Case reviewed with her GI physician assistant. Ongoing supportive care and trending of liver tests.  [name]. Eda Keys., M.D. [facility] Division of Gastroenterology

## 2017-03-06 ENCOUNTER — Other Ambulatory Visit (INDEPENDENT_AMBULATORY_CARE_PROVIDER_SITE_OTHER): Payer: Managed Care, Other (non HMO)

## 2017-03-06 ENCOUNTER — Other Ambulatory Visit: Payer: Self-pay

## 2017-03-06 DIAGNOSIS — R945 Abnormal results of liver function studies: Secondary | ICD-10-CM

## 2017-03-06 DIAGNOSIS — R7989 Other specified abnormal findings of blood chemistry: Secondary | ICD-10-CM

## 2017-03-06 LAB — HEPATIC FUNCTION PANEL
ALT: 296 U/L — AB (ref 0–35)
AST: 86 U/L — AB (ref 0–37)
Albumin: 3.8 g/dL (ref 3.5–5.2)
Alkaline Phosphatase: 75 U/L (ref 39–117)
BILIRUBIN DIRECT: 0.2 mg/dL (ref 0.0–0.3)
TOTAL PROTEIN: 7.2 g/dL (ref 6.0–8.3)
Total Bilirubin: 0.7 mg/dL (ref 0.2–1.2)

## 2017-03-20 ENCOUNTER — Other Ambulatory Visit (INDEPENDENT_AMBULATORY_CARE_PROVIDER_SITE_OTHER): Payer: Managed Care, Other (non HMO)

## 2017-03-20 DIAGNOSIS — R945 Abnormal results of liver function studies: Secondary | ICD-10-CM

## 2017-03-20 DIAGNOSIS — R7989 Other specified abnormal findings of blood chemistry: Secondary | ICD-10-CM

## 2017-03-20 LAB — HEPATIC FUNCTION PANEL
ALBUMIN: 4.1 g/dL (ref 3.5–5.2)
ALK PHOS: 50 U/L (ref 39–117)
ALT: 29 U/L (ref 0–35)
AST: 18 U/L (ref 0–37)
Bilirubin, Direct: 0.2 mg/dL (ref 0.0–0.3)
Total Bilirubin: 0.5 mg/dL (ref 0.2–1.2)
Total Protein: 7.2 g/dL (ref 6.0–8.3)

## 2017-04-02 ENCOUNTER — Encounter: Payer: Self-pay | Admitting: Internal Medicine

## 2017-04-02 ENCOUNTER — Ambulatory Visit (INDEPENDENT_AMBULATORY_CARE_PROVIDER_SITE_OTHER): Payer: Managed Care, Other (non HMO) | Admitting: Internal Medicine

## 2017-04-02 VITALS — BP 98/58 | HR 60 | Ht 64.0 in | Wt 124.0 lb

## 2017-04-02 DIAGNOSIS — R945 Abnormal results of liver function studies: Secondary | ICD-10-CM

## 2017-04-02 DIAGNOSIS — R7989 Other specified abnormal findings of blood chemistry: Secondary | ICD-10-CM

## 2017-04-02 DIAGNOSIS — K859 Acute pancreatitis without necrosis or infection, unspecified: Secondary | ICD-10-CM | POA: Diagnosis not present

## 2017-04-02 NOTE — Progress Notes (Signed)
HISTORY OF PRESENT ILLNESS:  Sierra Hernandez is a 26 y.o. female who presents today for follow-up. She was hospitalized 02/03/2017 for symptomatic cholelithiasis and choledocholithiasis. She subsequently underwent laparoscopic cholecystectomy (Dr. Luisa Hartornett) and was found to have a positive IOC. Subsequent ERCP with sphincterotomy and stone removal (Dr. Myrtie Neitheranis). She developed post-ERCP pancreatitis and issues with anasarca, intra-abdominal fluid collection, fevers, and leukocytosis. Eventually discharged 02/19/2017. Seen in follow-up in this office by the GI physician assistant 03/01/2017. Since that time she has had surgical follow-up. Previous problems with abnormal liver tests have resolved. At this time she is feeling much better. Improved appetite with weight gain. No problems with fevers. She is exercising and back at work. Except for some fatigue she is feeling fine. She does report once or twice per week experiencing some postprandial abdominal discomfort which other resolve spontaneously or response to sublingual Levsin and less than 30 minutes. She inquires about speaking to a nutritional specialist  REVIEW OF SYSTEMS:  All non-GI ROS negative except for allergies, menstrual cramps  Past Medical History:  Diagnosis Date  . Asthma   . Elevated LFTs   . Gallstones   . IBS (irritable bowel syndrome)   . Scoliosis     Past Surgical History:  Procedure Laterality Date  . MOLE REMOVAL     x 2 on back    Social History Sierra Fraisemanda Wahlberg  reports that  has never smoked. she has never used smokeless tobacco. She reports that she drinks alcohol. She reports that she does not use drugs.  family history includes Arthritis in her maternal grandmother and paternal grandmother; Gallstones in her maternal grandfather; Heart disease in her maternal grandmother; Hyperlipidemia in her father; Melanoma in her mother; Thyroid cancer in her paternal grandfather.  Allergies  Allergen Reactions  .  Augmentin [Amoxicillin-Pot Clavulanate]     Itching ? If related to complications with gallbladder or with augmentin  . Minocycline Swelling and Other (See Comments)    Joint pain and swelling, exhaustion       PHYSICAL EXAMINATION: Vital signs: BP (!) 98/58   Pulse 60   Ht 5\' 4"  (1.626 m)   Wt 124 lb (56.2 kg)   BMI 21.28 kg/m   Constitutional: generally well-appearing, no acute distress Psychiatric: alert and oriented x3, cooperative Eyes: extraocular movements intact, anicteric, conjunctiva pink Mouth: oral pharynx moist, no lesions Neck: supple no lymphadenopathy Cardiovascular: heart regular rate and rhythm, no murmur Lungs: clear to auscultation bilaterally Abdomen: soft, nontender, nondistended, no obvious ascites, no peritoneal signs, normal bowel sounds, no organomegaly Rectal: Omitted Extremities: no clubbing, cyanosis, or lower extremity edema bilaterally Skin: no lesions on visible extremities Neuro: No focal deficits. Cranial nerves intact  ASSESSMENT:  #1. Cholelithiasis and choledocholithiasis status post microscopic cholecystectomy (Dr. Luisa Hartornett) and ERCP with sphincterotomy and stone removal (Dr. Myrtie Neitheranis) #2. Post ERCP pancreatitis. Recovered #3. Transient elevation of liver tests felt to be reactive. Resolved #4. Patient wants to speak to nutritionist #5. Transient upper abdominal discomfort most likely spasm  PLAN:  #1. Name of nutritional specialist provided by nursing staff #2. Antispasmodic as needed. Reassurance #3. GI follow-up as needed  15 minutes spent face-to-face with the patient. Greater than 50% a time use for counseling regarding her recovery, assessment of abdominal complaints, and coordinating nutritional reference

## 2017-04-02 NOTE — Patient Instructions (Signed)
[facility]   Please follow up as needed

## 2017-05-02 ENCOUNTER — Other Ambulatory Visit: Payer: Self-pay

## 2017-05-02 ENCOUNTER — Telehealth: Payer: Self-pay | Admitting: Internal Medicine

## 2017-05-02 ENCOUNTER — Other Ambulatory Visit (INDEPENDENT_AMBULATORY_CARE_PROVIDER_SITE_OTHER): Payer: Managed Care, Other (non HMO)

## 2017-05-02 DIAGNOSIS — R945 Abnormal results of liver function studies: Secondary | ICD-10-CM

## 2017-05-02 DIAGNOSIS — R1013 Epigastric pain: Secondary | ICD-10-CM

## 2017-05-02 LAB — COMPREHENSIVE METABOLIC PANEL
ALBUMIN: 4.5 g/dL (ref 3.5–5.2)
ALK PHOS: 55 U/L (ref 39–117)
ALT: 45 U/L — AB (ref 0–35)
AST: 60 U/L — AB (ref 0–37)
BILIRUBIN TOTAL: 1.3 mg/dL — AB (ref 0.2–1.2)
BUN: 11 mg/dL (ref 6–23)
CO2: 27 mEq/L (ref 19–32)
CREATININE: 0.77 mg/dL (ref 0.40–1.20)
Calcium: 9.4 mg/dL (ref 8.4–10.5)
Chloride: 102 mEq/L (ref 96–112)
GFR: 97.09 mL/min (ref >60.00)
Glucose, Bld: 102 mg/dL — ABNORMAL HIGH (ref 70–99)
Potassium: 3.9 mEq/L (ref 3.5–5.1)
SODIUM: 138 meq/L (ref 135–145)
TOTAL PROTEIN: 7.7 g/dL (ref 6.0–8.3)

## 2017-05-02 LAB — CBC WITH DIFFERENTIAL/PLATELET
BASOS PCT: 0.3 % (ref 0.0–3.0)
Basophils Absolute: 0 10*3/uL (ref 0.0–0.1)
EOS ABS: 0.1 10*3/uL (ref 0.0–0.7)
Eosinophils Relative: 0.7 % (ref 0.0–5.0)
HCT: 40 % (ref 36.0–46.0)
HEMOGLOBIN: 13.3 g/dL (ref 12.0–15.0)
Lymphocytes Relative: 15.5 % (ref 12.0–46.0)
Lymphs Abs: 2 10*3/uL (ref 0.7–4.0)
MCHC: 33.1 g/dL (ref 30.0–36.0)
MCV: 89.8 fl (ref 78.0–100.0)
Monocytes Absolute: 0.9 10*3/uL (ref 0.1–1.0)
Monocytes Relative: 6.8 % (ref 3.0–12.0)
Neutro Abs: 9.7 10*3/uL — ABNORMAL HIGH (ref 1.4–7.7)
Neutrophils Relative %: 76.7 % (ref 43.0–77.0)
Platelets: 339 10*3/uL (ref 150.0–400.0)
RBC: 4.46 Mil/uL (ref 3.87–5.11)
RDW: 12.9 % (ref 11.5–15.5)
WBC: 12.6 10*3/uL — AB (ref 4.0–10.5)

## 2017-05-02 LAB — LIPASE: Lipase: 29 U/L (ref 11.0–59.0)

## 2017-05-02 MED ORDER — ONDANSETRON HCL 4 MG PO TABS: 4.0000 mg | ORAL_TABLET | Freq: Three times a day (TID) | ORAL | 1 refills | Status: DC | PRN

## 2017-05-02 MED ORDER — CIPROFLOXACIN HCL 500 MG PO TABS: 500.0000 mg | ORAL_TABLET | Freq: Two times a day (BID) | ORAL | 0 refills | Status: DC

## 2017-05-02 NOTE — Telephone Encounter (Signed)
Pt states she was fine this am when she got up. She ate breakfast this morning and left for work, states she started having terrible pain under her sternum. Describes the pain as what she had when she was having a gallbladder attack, hers was removed in September. Pt states she went home to lay down and felt very nauseated and has a loose stool. Pt wants to know what Dr. Marina GoodellPerry recommends. Please advise.

## 2017-05-02 NOTE — Telephone Encounter (Signed)
Pt wants to know what Dr. Marina GoodellPerry recommends she take for pain, states that she is hurting now but she just vomited so she isn't sure if that made the pain more intense. Script has been sent in for zofran and cipro. Please advise regarding something for discomfort.

## 2017-05-02 NOTE — Telephone Encounter (Signed)
Not sure what this is from but that she check CBC, comprehensive metabolic panel, lipase. Also, happy to prescribe Zofran for nausea if she wishes

## 2017-05-02 NOTE — Telephone Encounter (Signed)
Pt aware, lab orders in epic. Pt states the nausea is not that bad, does not want Zofran at the moment.

## 2017-05-03 ENCOUNTER — Other Ambulatory Visit: Payer: Self-pay | Admitting: Internal Medicine

## 2017-05-03 ENCOUNTER — Other Ambulatory Visit: Payer: Self-pay

## 2017-05-03 DIAGNOSIS — R945 Abnormal results of liver function studies: Secondary | ICD-10-CM

## 2017-05-03 MED ORDER — TRAMADOL HCL 50 MG PO TABS: 50.0000 mg | ORAL_TABLET | Freq: Four times a day (QID) | ORAL | 0 refills | Status: DC | PRN

## 2017-05-03 NOTE — Telephone Encounter (Signed)
Pt is aware, script sent to pharmacy. Pt states she is at work today and feels some better but she did want Dr. Marina GoodellPerry to know that her palms are itchy and her urine is an orange color this morning. Dr. Marina GoodellPerry notified.

## 2017-05-03 NOTE — Telephone Encounter (Signed)
noted 

## 2017-05-03 NOTE — Telephone Encounter (Signed)
She knows to go to the ER if pain is severe. She would like to try tramadol. Please advise.

## 2017-05-03 NOTE — Telephone Encounter (Signed)
Tramadol 50 mg every 4-6 hours as needed for severe pain. Dispensed 20. Let her know that this may cause nausea on an empty stomach

## 2017-05-03 NOTE — Telephone Encounter (Signed)
What does she want to try/ tolerate? Tramadol ? Vicodin? If her pain and symptoms become severe, she should go to the hospital. Await MRCP

## 2017-05-05 ENCOUNTER — Ambulatory Visit (HOSPITAL_COMMUNITY)
Admission: RE | Admit: 2017-05-05 | Discharge: 2017-05-05 | Disposition: A | Discharge status: Home / Self Care | Payer: Managed Care, Other (non HMO) | Source: Ambulatory Visit | Attending: Internal Medicine | Admitting: Internal Medicine

## 2017-05-05 DIAGNOSIS — R945 Abnormal results of liver function studies: Secondary | ICD-10-CM | POA: Insufficient documentation

## 2017-05-05 MED ORDER — GADOBENATE DIMEGLUMINE 529 MG/ML IV SOLN
15.0000 mL | Freq: Once | INTRAVENOUS | Status: AC | PRN
  Administered 2017-05-05: 12 mL via INTRAVENOUS

## 2017-05-08 ENCOUNTER — Encounter: Payer: Managed Care, Other (non HMO) | Attending: Internal Medicine | Admitting: Dietician

## 2017-05-08 ENCOUNTER — Encounter: Payer: Self-pay | Admitting: Dietician

## 2017-05-08 DIAGNOSIS — K858 Other acute pancreatitis without necrosis or infection: Secondary | ICD-10-CM

## 2017-05-08 DIAGNOSIS — Z713 Dietary counseling and surveillance: Secondary | ICD-10-CM | POA: Diagnosis not present

## 2017-05-08 DIAGNOSIS — Z9049 Acquired absence of other specified parts of digestive tract: Secondary | ICD-10-CM | POA: Diagnosis not present

## 2017-05-08 DIAGNOSIS — E43 Unspecified severe protein-calorie malnutrition: Secondary | ICD-10-CM

## 2017-05-08 DIAGNOSIS — K581 Irritable bowel syndrome with constipation: Secondary | ICD-10-CM | POA: Diagnosis not present

## 2017-05-08 NOTE — Patient Instructions (Signed)
Choose my WhoisBlogging[employer]chplate.gov  for meal ideas to increase variety.  Hello fresh or Blue apron etc. Could also be choices.  Continue to be mindful about your fat intake (avoid excessive). Avoid alcohol. Consider ways to increase your protein intake by choosing small amounts with each meal and snack. Continue to limit/avoid garlic. Continue 3 meals 2-3 snacks daily. Eat when you are hungry.  Add to the meal if you are hungry after you eat. Continue a probiotic. Drink enough water.

## 2017-05-08 NOTE — Progress Notes (Signed)
Medical Nutrition Therapy:  Appt start time: 1700 end time:  1800.   Assessment:  Primary concerns today: Patient is here today alone.  She would like to get a professional opinion related to her nutrition post gallbladder removal, ERCP and subsiquent pancreatitis with 11 days NPO and 2-3 days TPN prior to oral intake per patient.  She lost 13 lbs during this hospital stay and has since regained the weight.  The surgery was in September.  Her UBW is 125-132 lbs.  She notes that her hair is falling out more currently.  Labs includes AST/ALT 60/45 05/02/17 which are slightly elevated.  She reports MRI this weekend showed another bile duct stone.    Brief nutrition focused exam completed.  Tongue-normal, nails, hair normal.  Noted that she had a history of protein calorie malnutrition during hospitalization.  This has improved with good nutrition and regaining of weight post op.  Her diet lacks variety but recently tried [employer].  It is low to moderate in fat.  She notes that when she eats fast food or out to eat that she has increased nausea and pain.  Garlic also causes increased nausea.    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  She was on Murelax daily through high school, stopped during college and has resumed this since her surgery.   Patient lives alone.  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  Preferred Learning Style:   No preference indicated   Learning Readiness:   Ready  Change in progress   MEDICATIONS: see list to include probiotic, Magnesium, MVI   DIETARY INTAKE:  Eats out occasionally ([employer] or other but generally not fast food).  Greasy foods "don't set well".  Foods with increased garlic- causes nausea.    24-hr recall:  B ( AM): yogurt, cereal OR oatmeal, banana Snk ( AM): Lance crackers  L ( PM): fruit, sandwich (ham and cheese, butter on sourdough or oatmeal bread) Snk ( PM): none or fruit D ( PM): chicken, sweet  potato, pasta or potato, vegetables (fresh with small amount butter or olive oil), occasional italian ice OR omelet (cheese, veges, ham) Snk ( PM): none Beverages: coffee (black), occasional tea or hot chocolate, water, 2 glasses wine twice per month  Usual physical activity: Pilate's class run by Physical Therapists OR Body Pump or Cycle or [facility] routinely prior to surgery and is now slowly getting back into these.  She runs for 30 minutes about once per week.  Estimated energy needs: 1800 calories 75-85 g protein 40 grams fat  Progress Towards Goal(s):  In progress.   Nutritional Diagnosis:  NB-1.1 Food and nutrition-related knowledge deficit As related to balance of protein, carbohydrate, fat.  As evidenced by diet hx .    Intervention:  Nutrition education related to balanced nutrition, adequate protein and calories.  Discussed need for vitamin D in the winter (milk, fatty fish) and to discuss supplementation with MD.  Discussed need to eat when hungry even if she finished a meal.  Discussed adding protein throughout the day with all meals and snacks as well as protein sources.  Recommended avoiding alcohol at this time although she is a light occasional drinker.  Encouraged her to drink more fluids to maintain hydration. Discussed fat sources and continue to be mindful of excessive fat.  Choose my Whois[employer]logging[employer]chplate.gov  for meal ideas to increase variety.  Hello fresh or Blue apron etc. Could also be choices.  Continue to be mindful about your fat intake (avoid excessive). Avoid alcohol. Consider ways to increase your protein intake by choosing small amounts with each meal and snack. Continue to limit/avoid garlic. Continue 3 meals 2-3 snacks daily. Eat when you are hungry.  Add to the meal if you are hungry after you eat. Continue a probiotic. Drink enough water.   Teaching Method Utilized:  Visual Auditory Hands on  Handouts given during visit include:  Snack  list  Meal plan card  Hello fresh coupon  Barriers to learning/adherence to lifestyle change: none  Demonstrated degree of understanding via:  Teach Back   Monitoring/Evaluation:  Dietary intake, exercise, and body weight prn.

## 2017-05-09 ENCOUNTER — Other Ambulatory Visit: Payer: Self-pay

## 2017-05-09 ENCOUNTER — Telehealth: Payer: Self-pay | Admitting: Internal Medicine

## 2017-05-09 DIAGNOSIS — K8040 Calculus of bile duct with cholecystitis, unspecified, without obstruction: Secondary | ICD-10-CM

## 2017-05-09 NOTE — Telephone Encounter (Signed)
See result note.  

## 2017-05-10 ENCOUNTER — Other Ambulatory Visit (INDEPENDENT_AMBULATORY_CARE_PROVIDER_SITE_OTHER): Payer: Managed Care, Other (non HMO)

## 2017-05-10 DIAGNOSIS — K8040 Calculus of bile duct with cholecystitis, unspecified, without obstruction: Secondary | ICD-10-CM

## 2017-05-10 LAB — CBC WITH DIFFERENTIAL/PLATELET
BASOS PCT: 0.7 % (ref 0.0–3.0)
Basophils Absolute: 0.1 10*3/uL (ref 0.0–0.1)
EOS PCT: 3 % (ref 0.0–5.0)
Eosinophils Absolute: 0.2 10*3/uL (ref 0.0–0.7)
HCT: 41.5 % (ref 36.0–46.0)
HEMOGLOBIN: 13.6 g/dL (ref 12.0–15.0)
Lymphocytes Relative: 43 % (ref 12.0–46.0)
Lymphs Abs: 3.3 10*3/uL (ref 0.7–4.0)
MCHC: 32.8 g/dL (ref 30.0–36.0)
MCV: 91.2 fl (ref 78.0–100.0)
MONO ABS: 0.7 10*3/uL (ref 0.1–1.0)
Monocytes Relative: 8.5 % (ref 3.0–12.0)
Neutro Abs: 3.4 10*3/uL (ref 1.4–7.7)
Neutrophils Relative %: 44.8 % (ref 43.0–77.0)
Platelets: 385 10*3/uL (ref 150.0–400.0)
RBC: 4.55 Mil/uL (ref 3.87–5.11)
RDW: 12.7 % (ref 11.5–15.5)
WBC: 7.7 10*3/uL (ref 4.0–10.5)

## 2017-05-10 LAB — COMPREHENSIVE METABOLIC PANEL
ALBUMIN: 4.3 g/dL (ref 3.5–5.2)
ALK PHOS: 62 U/L (ref 39–117)
ALT: 112 U/L — ABNORMAL HIGH (ref 0–35)
AST: 32 U/L (ref 0–37)
BUN: 15 mg/dL (ref 6–23)
CALCIUM: 9.3 mg/dL (ref 8.4–10.5)
CHLORIDE: 103 meq/L (ref 96–112)
CO2: 29 mEq/L (ref 19–32)
Creatinine, Ser: 0.82 mg/dL (ref 0.40–1.20)
GFR: 90.27 mL/min (ref >60.00)
Glucose, Bld: 53 mg/dL — ABNORMAL LOW (ref 70–99)
POTASSIUM: 4.1 meq/L (ref 3.5–5.1)
SODIUM: 140 meq/L (ref 135–145)
TOTAL PROTEIN: 7.5 g/dL (ref 6.0–8.3)
Total Bilirubin: 0.7 mg/dL (ref 0.2–1.2)

## 2017-05-10 LAB — LIPASE: Lipase: 21 U/L (ref 11.0–59.0)

## 2017-05-10 NOTE — Telephone Encounter (Signed)
See result note.  

## 2017-05-10 NOTE — Telephone Encounter (Signed)
Pt returning call about lab results from yesterday 12.12.18.

## 2017-05-14 ENCOUNTER — Telehealth: Payer: Self-pay | Admitting: Internal Medicine

## 2017-05-14 NOTE — Telephone Encounter (Signed)
No. I do not related to that issue. She could consult with the dermatologist. I appreciate her checking with us

## 2017-05-14 NOTE — Telephone Encounter (Signed)
Spoke with patient and she is aware  

## 2017-05-14 NOTE — Telephone Encounter (Signed)
Pt states she is losing a lot of hair, she is wanting to know if this could be from her pancreatitis. Please advise.

## 2017-06-22 ENCOUNTER — Encounter: Payer: Self-pay | Admitting: Internal Medicine

## 2017-06-22 ENCOUNTER — Ambulatory Visit (INDEPENDENT_AMBULATORY_CARE_PROVIDER_SITE_OTHER): Payer: Managed Care, Other (non HMO) | Admitting: Internal Medicine

## 2017-06-22 VITALS — BP 112/62 | HR 70 | Ht 64.0 in | Wt 130.0 lb

## 2017-06-22 DIAGNOSIS — K859 Acute pancreatitis without necrosis or infection, unspecified: Secondary | ICD-10-CM

## 2017-06-22 DIAGNOSIS — R1013 Epigastric pain: Secondary | ICD-10-CM | POA: Diagnosis not present

## 2017-06-22 DIAGNOSIS — K8050 Calculus of bile duct without cholangitis or cholecystitis without obstruction: Secondary | ICD-10-CM

## 2017-06-22 DIAGNOSIS — R945 Abnormal results of liver function studies: Secondary | ICD-10-CM | POA: Diagnosis not present

## 2017-06-22 DIAGNOSIS — R7989 Other specified abnormal findings of blood chemistry: Secondary | ICD-10-CM

## 2017-06-22 NOTE — Patient Instructions (Signed)
If you are age 26 or older, your body mass index should be between 23-30. Your Body mass index is 22.31 kg/m. If this is out of the aforementioned range listed, please consider follow up with your Primary Care Provider.  If you are age 364 or younger, your body mass index should be between 19-25. Your Body mass index is 22.31 kg/m. If this is out of the aformentioned range listed, please consider follow up with your Primary Care Provider.   Please follow up with Dr. Marina GoodellPerry as needed.  Thank you.

## 2017-06-22 NOTE — Progress Notes (Signed)
HISTORY OF PRESENT ILLNESS:  Sierra Fraisemanda Chunn is a [age] y.o. female with a history of symptomatic cholelithiasis and choledocholithiasis which she underwent microscopic cholecystectomy (Dr. Luisa Hartornett) and ERCP with sphincterotomy and stone removal (Dr. Myrtie Neitheranis) September 2018. Her course was complicated by post-ERCP pancreatitis and significant intra-abdominal fluid collection with fevers and leukocytosis. Eventually discharged after 2 week hospital stay. I last saw her 04/02/2017. At that time she was doing well with normalized liver tests and no abdominal complaints. She subsequently contacted the office with complaints of abdominal pain reminiscent of her initial presentation. Slightly elevated transaminases at that time. MRCP ordered and suggested possible 3 mm distal bile duct stone with normal bile duct diameter. Patient subsequently had resolution of pain. Liver tests improved. No problems over the past 5 weeks. No complaints. Her for routine checkup  REVIEW OF SYSTEMS:  All non-GI ROS negative upon comprehensive review  Past Medical History:  Diagnosis Date  . Asthma   . Elevated LFTs   . Gallstones   . IBS (irritable bowel syndrome)   . Pancreatitis 01/2017  . Scoliosis     Past Surgical History:  Procedure Laterality Date  . CHOLECYSTECTOMY N/A 02/05/2017   Procedure: LAPAROSCOPIC CHOLECYSTECTOMY WITH INTRAOPERATIVE CHOLANGIOGRAM;  Surgeon: Harriette Bouillonornett, Thomas, MD;  Location: [facility] OR;  Service: General;  Laterality: N/A;  . ERCP N/A 02/06/2017   Procedure: ENDOSCOPIC RETROGRADE CHOLANGIOPANCREATOGRAPHY (ERCP);  Surgeon: Sherrilyn Ristanis, Henry L III, MD;  [facility];  Service: Gastroenterology;  Laterality: N/A;  . MOLE REMOVAL     x 2 on back    Social History Sierra Hernandez  reports that  has never smoked. she has never used smokeless tobacco. She reports that she drinks alcohol. She reports that she does not use drugs.  family history includes Arthritis in her maternal grandmother and  paternal grandmother; Gallstones in her maternal grandfather; Heart disease in her maternal grandmother; Hyperlipidemia in her father; Melanoma in her mother; Thyroid cancer in her paternal grandfather.  Allergies  Allergen Reactions  . Augmentin [Amoxicillin-Pot Clavulanate]     Itching ? If related to complications with gallbladder or with augmentin  . Minocycline Swelling and Other (See Comments)    Joint pain and swelling, exhaustion       PHYSICAL EXAMINATION: Vital signs: BP 112/62   Pulse 70   Ht 5\' 4"  (1.626 m)   Wt 130 lb (59 kg)   BMI 22.31 kg/m   Constitutional: generally well-appearing, no acute distress Psychiatric: alert and oriented x3, cooperative Eyes: extraocular movements intact, anicteric, conjunctiva pink Mouth: oral pharynx moist, no lesions Neck: supple no lymphadenopathy Cardiovascular: heart regular rate and rhythm, no murmur Lungs: clear to auscultation bilaterally Abdomen: soft, nontender, nondistended, no obvious ascites, no peritoneal signs, normal bowel sounds, no organomegaly Rectal: Omitted clubbing, cyanosis, or Extremities: no lower extremity edema bilaterally Skin: no lesions on visible extremities Neuro: No focal deficits.   ASSESSMENT:  #1. History of symptomatic cholelithiasis and choledocholithiasis status post microscopic cholecystectomy, ERCP with sphincterotomy #2. History of post ERCP pancreatitis as above #3. Recent problems with abdominal pain and transient elevation of liver test likely secondary to retained diminutive choledocholithe. As she has a biliary she dry me, I suspect that she passed the stone fragment. No further problems since. I reviewed with her in detail her interval laboratories and imaging results   PLAN:  #1. Expectant management. Should the patient have recurrent problems with biliary type pain and elevated liver tests that would recommend ERC with stone extraction. Otherwise, follow-up  as needed

## 2018-03-11 IMAGING — MR MR 3D RECON AT SCANNER
18 of 22 series · 18 of 22 positions shown · IV contrast (Yes)
Comparison: MRI 02/04/2017, HIDA scan 02/04/2017, CT 02/03/2017

CLINICAL DATA: Abnormal liver function tests. RIGHT upper quadrant
pain. Patient status post laparoscopic cholecystectomy and ERCP for
stone removal. Post ERCP pancreatitis ensued.

EXAM:
MRI ABDOMEN WITHOUT AND WITH CONTRAST (INCLUDING MRCP)
TECHNIQUE: Multiplanar multisequence MR imaging of the abdomen was performed
both before and after the administration of intravenous contrast.
Heavily T2-weighted images of the biliary and pancreatic ducts were
obtained, and three-dimensional MRCP images were rendered by post
processing.
CONTRAST:  12mL MULTIHANCE GADOBENATE DIMEGLUMINE 529 MG/ML IV SOLN

[Series 3: T2 fat-sat · axial · 5.0mm · 0.78mm/px · 1 of 58 slices shown]
[im 1/58]
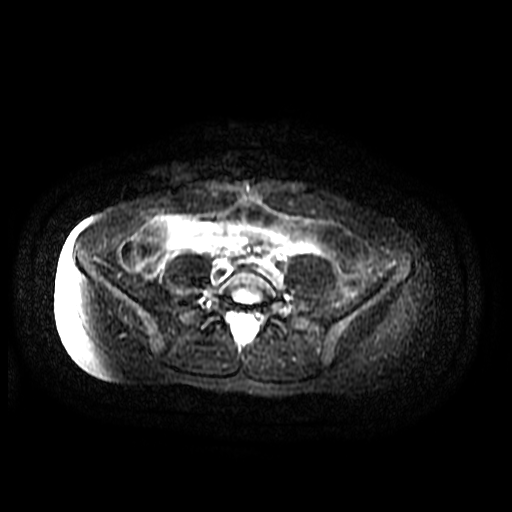

[Series 4: MRCP · coronal · 1.6mm · 0.62mm/px · 1 of 116 slices shown (1 of 2)]
[im 1/116]
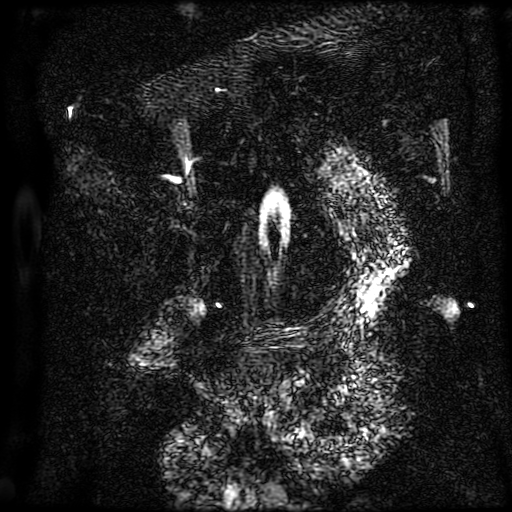

[Series 6: DWI b500 · axial · 6.0mm · 1.48mm/px · 1 of 76 slices shown]
[im 1/76]
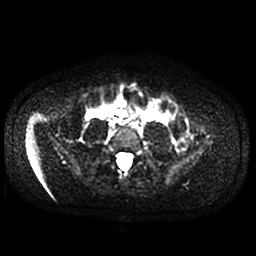

[Series 8: ax dualecho · axial · 5.0mm · 0.78mm/px · 1 of 104 slices shown]
[im 1/104]
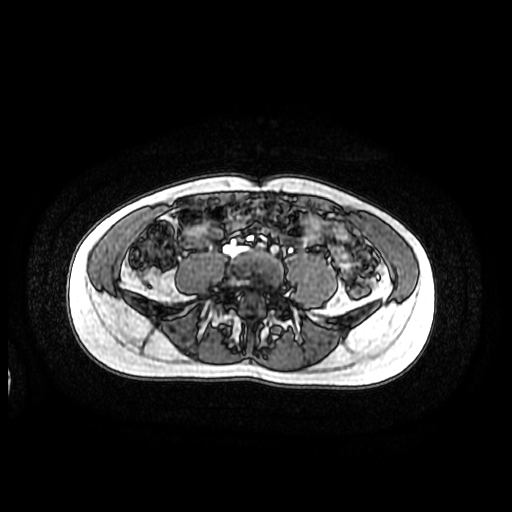

[Series 9: bSSFP fat-sat · coronal · 5.0mm · 0.70mm/px · 1 of 33 slices shown]
[im 1/33]
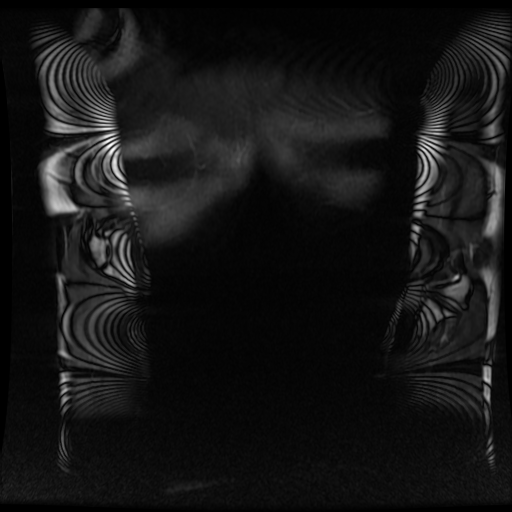

[Series 10: MRCP · coronal · 40.0mm · 0.70mm/px · 1 of 9 slices shown (2 of 2)]
[im 1/9]
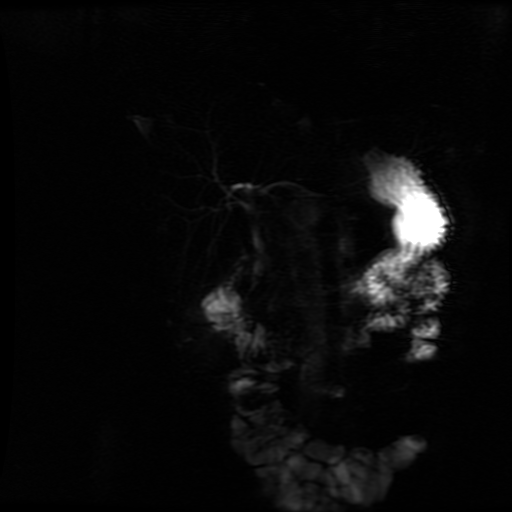

[Series 11: T2 · axial · 5.0mm · 0.78mm/px · 1 of 52 slices shown]
[im 1/52]
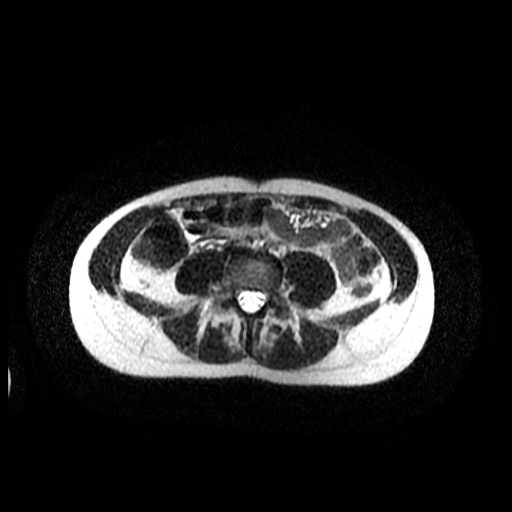

[Series 400: reformatted · axial · 1.6mm · 0.62mm/px · 1 of 93 slices shown]
[im 1/93]
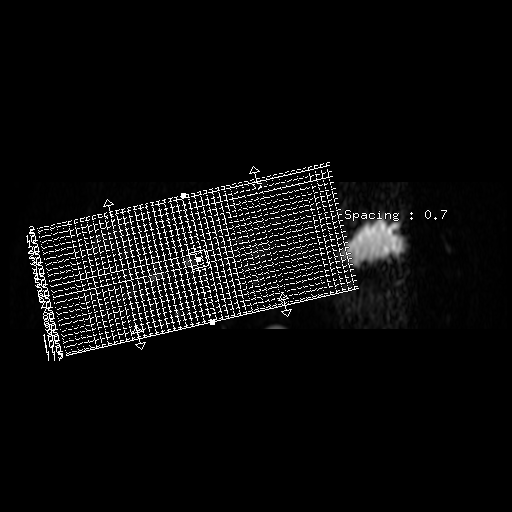

[Series 600: DWI · axial · 6.0mm · 1.48mm/px · 1 of 38 slices shown]
[im 1/38]
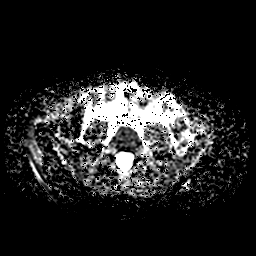

[Series 1200: T1 dynamic · axial · 5.0mm · 0.78mm/px · 1 of 108 slices shown (1 of 5)]
[im 1/108]
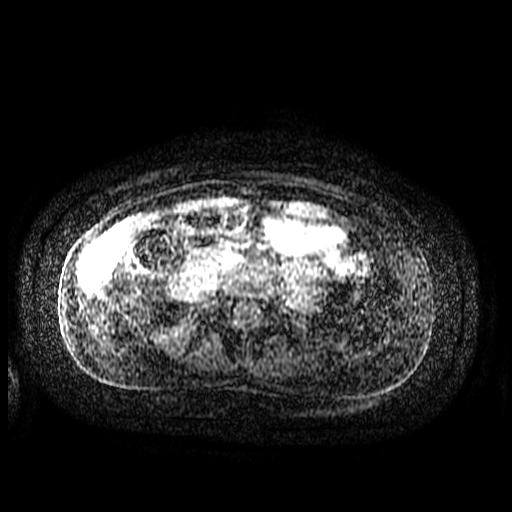

[Series 1201: T1 dynamic · axial · 5.0mm · 0.78mm/px · 1 of 108 slices shown (2 of 5)]
[im 1/108]
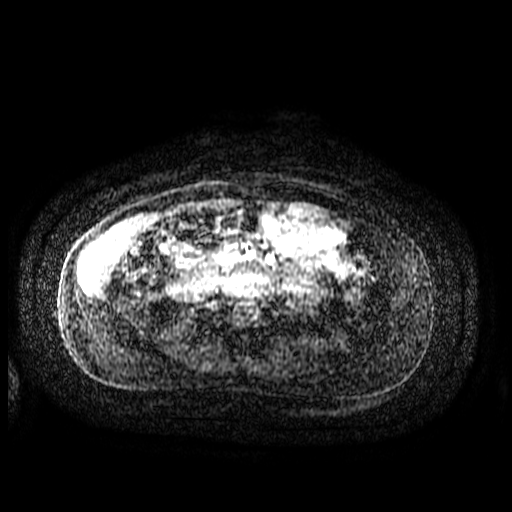

[Series 1203: T1 dynamic · axial · 5.0mm · 0.78mm/px · 1 of 108 slices shown (3 of 5)]
[im 1/108]
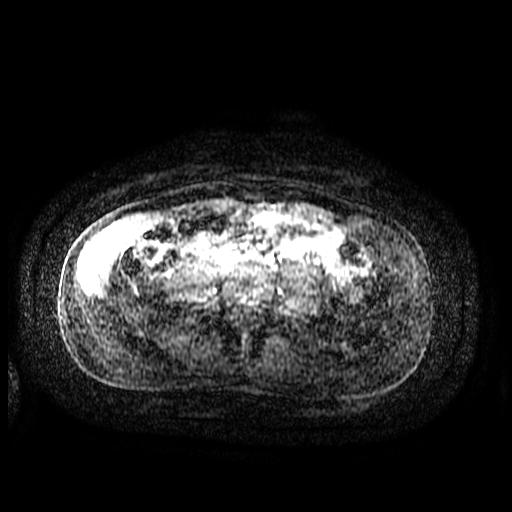

[Series 1204: T1 dynamic · axial · 5.0mm · 0.78mm/px · 1 of 108 slices shown (4 of 5)]
[im 1/108]
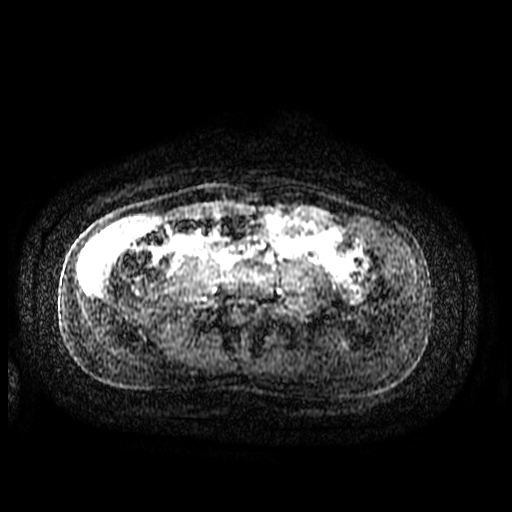

[Series 1205: T1 dynamic · axial · 5.0mm · 0.78mm/px · 1 of 108 slices shown (5 of 5)]
[im 1/108]
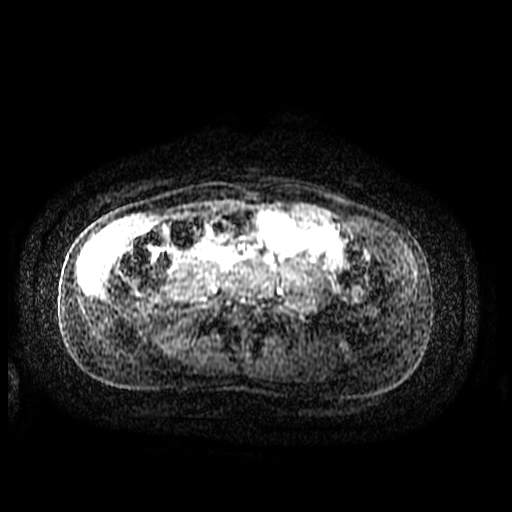

[((id)/(id)/1)-((id)/(id)/1) · axial · 5.0mm · 0.78mm/px · 1 of 108 slices shown (1 of 4)]
[im 1/108]
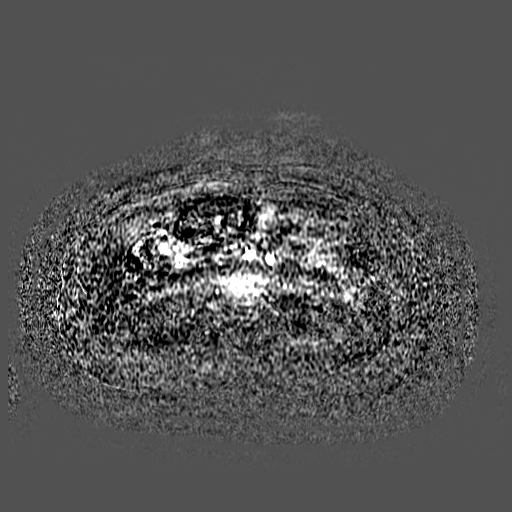

[((id)/(id)/1)-((id)/(id)/1) · axial · 5.0mm · 0.78mm/px · 1 of 108 slices shown (2 of 4)]
[im 1/108]
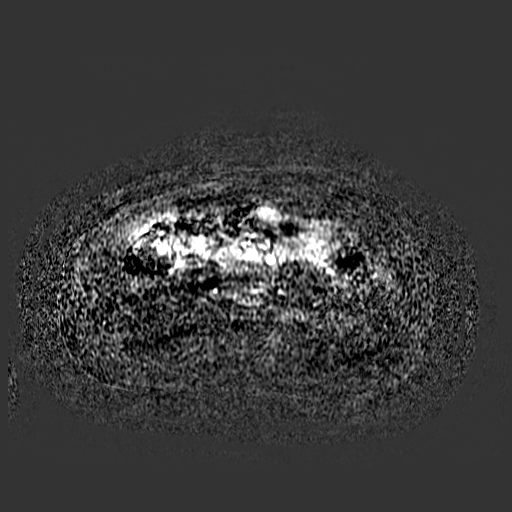

[((id)/(id)/1)-((id)/(id)/1) · axial · 5.0mm · 0.78mm/px · 1 of 108 slices shown (3 of 4)]
[im 1/108]
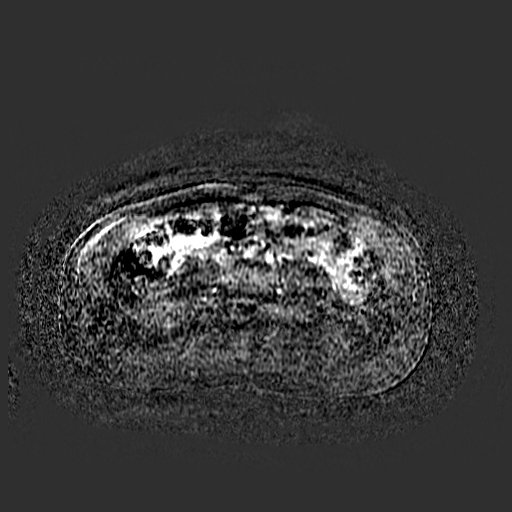

[((id)/(id)/1)-((id)/(id)/1) · axial · 5.0mm · 0.78mm/px · 1 of 108 slices shown (4 of 4)]
[im 1/108]
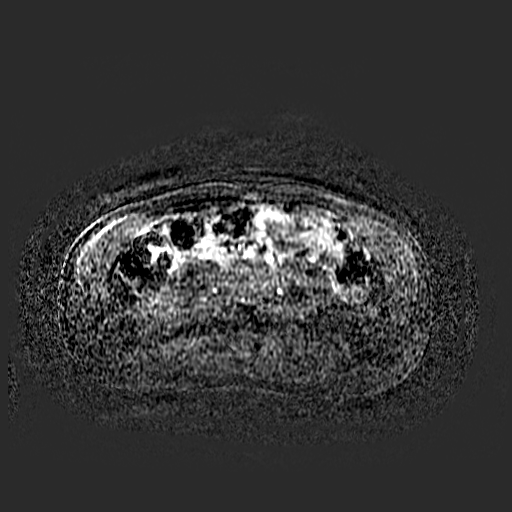

[18 of 22 positions shown; findings below may reference images not displayed]

FINDINGS: Lower chest:  Lung bases are clear.

Hepatobiliary: No focal hepatic lesion. The intrahepatic bile ducts
are normal. The common hepatic duct is normal. The common bile duct
is normal caliber at 4 mm. Postcholecystectomy. No abnormal fluid
collections in the porta hepatis or gallbladder fossa.

At the junction of the common bile duct and pancreatic duct there is
a 3 mm hypointense lesion on the heavily T2 weighted MRCP series
(image 58, series 4). This lesion appears to be also present on the
coronal fat sat FIESTA series (image 24, series 9). Question if this
lesion is present on the axial T2 series (image 35, series 11).

On the delayed axial postcontrast imaging (image 73, series 3859) as
well as the subtracted series (series 2225 and 3859), there is a rim
of enhancement around this distal common bile duct.

Pancreas: There is no evidence of pancreatic duct dilatation. No
pancreatic inflammation. Normal pancreatic ductal anatomy.

Spleen: Normal spleen

Adrenals/urinary tract: Adrenal glands and kidneys normal

Stomach/Bowel: Stomach and limited of the small bowel is
unremarkable

Vascular/Lymphatic: Abdominal aortic normal caliber. No
retroperitoneal periportal lymphadenopathy.

Musculoskeletal: No aggressive osseous lesion
IMPRESSION: 1. Suspicion of small retained stone within the distal common bile
duct at the confluence of the common bile duct and pancreatic duct.
2. Mild rim enhancement of the most distal common bile duct leading
up the ampulla could represent focal cholangitis.
3. No intrahepatic or extrahepatic biliary duct dilatation.
4. No evidence of acute pancreatitis.
5. Normal liver parenchyma.
These results will be called to the ordering clinician or
representative by the Radiologist Assistant, and communication
documented in the PACS or zVision Dashboard.

## 2018-12-13 ENCOUNTER — Emergency Department (HOSPITAL_COMMUNITY)
Admission: EM | Admit: 2018-12-13 | Discharge: 2018-12-14 | Disposition: A | Discharge status: Home / Self Care | Payer: Managed Care, Other (non HMO) | Attending: Emergency Medicine | Admitting: Emergency Medicine

## 2018-12-13 ENCOUNTER — Other Ambulatory Visit: Payer: Self-pay

## 2018-12-13 ENCOUNTER — Telehealth: Payer: Self-pay | Admitting: Internal Medicine

## 2018-12-13 ENCOUNTER — Emergency Department (HOSPITAL_COMMUNITY): Payer: Managed Care, Other (non HMO)

## 2018-12-13 DIAGNOSIS — J45909 Unspecified asthma, uncomplicated: Secondary | ICD-10-CM | POA: Insufficient documentation

## 2018-12-13 DIAGNOSIS — R1011 Right upper quadrant pain: Secondary | ICD-10-CM | POA: Diagnosis present

## 2018-12-13 DIAGNOSIS — R748 Abnormal levels of other serum enzymes: Secondary | ICD-10-CM | POA: Diagnosis not present

## 2018-12-13 DIAGNOSIS — R1013 Epigastric pain: Secondary | ICD-10-CM | POA: Diagnosis not present

## 2018-12-13 DIAGNOSIS — Z79899 Other long term (current) drug therapy: Secondary | ICD-10-CM | POA: Diagnosis not present

## 2018-12-13 LAB — COMPREHENSIVE METABOLIC PANEL
ALT: 117 U/L — ABNORMAL HIGH (ref 0–44)
AST: 167 U/L — ABNORMAL HIGH (ref 15–41)
Albumin: 4.1 g/dL (ref 3.5–5.0)
Alkaline Phosphatase: 67 U/L (ref 38–126)
Anion gap: 9 (ref 5–15)
BUN: 16 mg/dL (ref 6–20)
CO2: 26 mmol/L (ref 22–32)
Calcium: 9.2 mg/dL (ref 8.9–10.3)
Chloride: 102 mmol/L (ref 98–111)
Creatinine, Ser: 0.84 mg/dL (ref 0.44–1.00)
GFR calc Af Amer: >60 mL/min (ref >60)
GFR calc non Af Amer: >60 mL/min (ref >60)
Glucose, Bld: 118 mg/dL — ABNORMAL HIGH (ref 70–99)
Potassium: 4.1 mmol/L (ref 3.5–5.1)
Sodium: 137 mmol/L (ref 135–145)
Total Bilirubin: 0.8 mg/dL (ref 0.3–1.2)
Total Protein: 7.2 g/dL (ref 6.5–8.1)

## 2018-12-13 LAB — URINALYSIS, ROUTINE W REFLEX MICROSCOPIC
Bilirubin Urine: NEGATIVE
Glucose, UA: NEGATIVE mg/dL
Hgb urine dipstick: NEGATIVE
Ketones, ur: 5 mg/dL — AB
Leukocytes,Ua: NEGATIVE
Nitrite: NEGATIVE
Protein, ur: 30 mg/dL — AB
Specific Gravity, Urine: 1.026 (ref 1.005–1.030)
pH: 6 (ref 5.0–8.0)

## 2018-12-13 LAB — CBC
HCT: 41.9 % (ref 36.0–46.0)
Hemoglobin: 13.7 g/dL (ref 12.0–15.0)
MCH: 29.5 pg (ref 26.0–34.0)
MCHC: 32.7 g/dL (ref 30.0–36.0)
MCV: 90.3 fL (ref 80.0–100.0)
Platelets: 340 10*3/uL (ref 150–400)
RBC: 4.64 MIL/uL (ref 3.87–5.11)
RDW: 11.9 % (ref 11.5–15.5)
WBC: 18.9 10*3/uL — ABNORMAL HIGH (ref 4.0–10.5)
nRBC: 0 % (ref 0.0–0.2)

## 2018-12-13 LAB — I-STAT BETA HCG BLOOD, ED (MC, WL, AP ONLY): I-stat hCG, quantitative: <5 m[IU]/mL (ref <5)

## 2018-12-13 LAB — LIPASE, BLOOD: Lipase: 34 U/L (ref 11–51)

## 2018-12-13 MED ORDER — SODIUM CHLORIDE 0.9% FLUSH: 3.0000 mL | Freq: Once | INTRAVENOUS | Status: DC

## 2018-12-13 MED ORDER — SODIUM CHLORIDE 0.9 % IV BOLUS
1000.0000 mL | Freq: Once | INTRAVENOUS | Status: AC
  Administered 2018-12-13: 1000 mL via INTRAVENOUS

## 2018-12-13 MED ORDER — ONDANSETRON 4 MG PO TBDP
4.0000 mg | ORAL_TABLET | Freq: Once | ORAL | Status: AC | PRN
  Administered 2018-12-13: 4 mg via ORAL
  Filled 2018-12-13: qty 1

## 2018-12-13 NOTE — ED Provider Notes (Signed)
[facility]ENC[facility] Provider Note   CSN: [id number] Arrival date & time: 12/13/18  1910    History   Chief Complaint Chief Complaint  Patient presents with  . Abdominal Pain    HPI Sierra Hernandez is a 28 y.o. female with a hx of cholecystectomy complicated by pancreatitis (2018 by Dr. Luisa Hartornett) presents to the Emergency Department complaining of gradual, persistent, progressively worsening epigastric and RUQ abd pain onset around 4:30PM. Pt reports pain was 8/10 at the time and without radiation, now a 1/10.  Pt states pain was the same as previous episode of cholecystitis.  Pt with associated nausea and vomiting x3.  Emesis reported to be NBNB. Pt denies fever, chills, headache, neck pain, chest pain, SOB, weakness, dizziness, syncope, urinary or vaginal symptoms.       The history is provided by the patient and medical records. No language interpreter was used.    Past Medical History:  Diagnosis Date  . Asthma   . Elevated LFTs   . Gallstones   . IBS (irritable bowel syndrome)   . Pancreatitis 01/2017  . Scoliosis     Patient Active Problem List   Diagnosis Date Noted  . Intraabdominal fluid collection   . Abnormal liver function tests   . Protein-calorie malnutrition, severe 02/14/2017  . Abdominal distention   . Abdominal fluid collection   . Acute pancreatitis 02/08/2017  . Cholelithiasis with choledocholithiasis     Past Surgical History:  Procedure Laterality Date  . CHOLECYSTECTOMY N/A 02/05/2017   Procedure: LAPAROSCOPIC CHOLECYSTECTOMY WITH INTRAOPERATIVE CHOLANGIOGRAM;  Surgeon: Harriette Bouillonornett, Thomas, MD;  Location: [facility] OR;  Service: General;  Laterality: N/A;  . ERCP N/A 02/06/2017   Procedure: ENDOSCOPIC RETROGRADE CHOLANGIOPANCREATOGRAPHY (ERCP);  Surgeon: Sherrilyn Ristanis, Henry L III, MD;  [facility];  Service: Gastroenterology;  Laterality: N/A;  . MOLE REMOVAL     x 2 on back     OB History   No obstetric history on  file.      Home Medications    Prior to Admission medications   Medication Sig Start Date End Date Taking? Authorizing Provider  cetirizine (ZYRTEC) 10 MG tablet Take 10 mg by mouth at bedtime.     [provider]  EPINEPHrine (EPIPEN 2-PAK) 0.3 mg/0.3 mL IJ SOAJ injection Inject 0.3 mg into the muscle once as needed (severe allergic reaction).    [provider]  ibuprofen (ADVIL,MOTRIN) 200 MG tablet Take 200 mg by mouth 2 (two) times daily as needed for headache (pain).    [provider]  magnesium 30 MG tablet Take 30 mg by mouth 2 (two) times daily.    [provider]  Multiple Vitamin (MULTIVITAMIN WITH MINERALS) TABS tablet Take 1 tablet by mouth daily.    [provider]  naproxen sodium (ALEVE) 220 MG tablet Take 220-440 mg by mouth 2 (two) times daily as needed (pain/headache).     [provider]  norgestimate-ethinyl estradiol (ORTHO-CYCLEN,SPRINTEC,PREVIFEM) 0.25-35 MG-MCG tablet Take 1 tablet by mouth at bedtime.     [provider]  ondansetron (ZOFRAN ODT) 4 MG disintegrating tablet 4mg  ODT q4 hours prn nausea/vomit 12/14/18   Havilah Topor, Dahlia ClientHannah, PA-C  ondansetron (ZOFRAN) 4 MG tablet Take 1 tablet (4 mg total) by mouth every 8 (eight) hours as needed for nausea or vomiting. 05/02/17   Hilarie FredricksonPerry, John N, MD  polyethylene glycol [facility](MIRALAX / Ethelene HalGLYCOLAX) packet Take 17 g daily as needed by mouth (constipation). Mix in 8 oz liquid and drink  [provider]  Probiotic Product (ALIGN) 4 MG CAPS Take 1 capsule daily by mouth.    [provider]    Family History Family History  Problem Relation Age of Onset  . Melanoma Mother   . Hyperlipidemia Father   . Heart disease Maternal Grandmother   . Arthritis Maternal Grandmother   . Gallstones Maternal Grandfather   . Arthritis Paternal Grandmother   . Thyroid cancer Paternal Grandfather   . Colon cancer Neg Hx   . Stomach cancer Neg Hx   . Pancreatic  cancer Neg Hx   . Liver disease Neg Hx     Social History Social History   Tobacco Use  . Smoking status: Never Smoker  . Smokeless tobacco: Never Used  Substance Use Topics  . Alcohol use: Yes    Comment: ~2x per month  . Drug use: No     Allergies   Augmentin [amoxicillin-pot clavulanate] and Minocycline   Review of Systems Review of Systems  Constitutional: Negative for appetite change, diaphoresis, fatigue, fever and unexpected weight change.  HENT: Negative for mouth sores.   Eyes: Negative for visual disturbance.  Respiratory: Negative for cough, chest tightness, shortness of breath and wheezing.   Cardiovascular: Negative for chest pain.  Gastrointestinal: Positive for abdominal pain, nausea and vomiting. Negative for constipation and diarrhea.  Endocrine: Negative for polydipsia, polyphagia and polyuria.  Genitourinary: Negative for dysuria, frequency, hematuria and urgency.  Musculoskeletal: Negative for back pain and neck stiffness.  Skin: Negative for rash.  Allergic/Immunologic: Negative for immunocompromised state.  Neurological: Negative for syncope, light-headedness and headaches.  Hematological: Does not bruise/bleed easily.  Psychiatric/Behavioral: Negative for sleep disturbance. The patient is not nervous/anxious.      Physical Exam Updated Vital Signs BP 135/65 (BP Location: Right Arm)   Pulse 68   Temp 98 F (36.7 C) (Oral)   Resp 16   Ht 5\' 4"  (1.626 m)   Wt 59.9 kg   SpO2 100%   BMI 22.66 kg/m   Physical Exam Vitals signs and nursing note reviewed.  Constitutional:      General: She is not in acute distress.    Appearance: She is not diaphoretic.  HENT:     Head: Normocephalic.  Eyes:     General: No scleral icterus.    Conjunctiva/sclera: Conjunctivae normal.  Neck:     Musculoskeletal: Normal range of motion.  Cardiovascular:     Rate and Rhythm: Normal rate and regular rhythm.     Pulses: Normal pulses.          Radial  pulses are 2+ on the right side and 2+ on the left side.  Pulmonary:     Effort: No tachypnea, accessory muscle usage, prolonged expiration, respiratory distress or retractions.     Breath sounds: No stridor.     Comments: Equal chest rise. No increased work of breathing. Abdominal:     General: There is no distension.     Palpations: Abdomen is soft.     Tenderness: There is no abdominal tenderness. There is no guarding or rebound.  Musculoskeletal:     Comments: Moves all extremities equally and without difficulty.  Skin:    General: Skin is warm and dry.     Capillary Refill: Capillary refill takes less than 2 seconds.  Neurological:     Mental Status: She is alert.     GCS: GCS eye subscore is 4. GCS verbal subscore is 5. GCS motor subscore is 6.  Comments: Speech is clear and goal oriented.  Psychiatric:        Mood and Affect: Mood normal.      ED Treatments / Results  Labs (all labs ordered are listed, but only abnormal results are displayed) Labs Reviewed  COMPREHENSIVE METABOLIC PANEL - Abnormal; Notable for the following components:      Result Value   Glucose, Bld 118 (*)    AST 167 (*)    ALT 117 (*)    All other components within normal limits  CBC - Abnormal; Notable for the following components:   WBC 18.9 (*)    All other components within normal limits  URINALYSIS, ROUTINE W REFLEX MICROSCOPIC - Abnormal; Notable for the following components:   Ketones, ur 5 (*)    Protein, ur 30 (*)    Bacteria, UA RARE (*)    All other components within normal limits  LIPASE, BLOOD  HEPATITIS PANEL, ACUTE  I-STAT BETA HCG BLOOD, ED ([facility], [facility], [facility] ONLY)     Radiology Ct Abdomen Pelvis W Contrast  Result Date: 12/14/2018 CLINICAL DATA:  28 year old female with elevated LFTs. History of pancreatitis status post cholecystectomy. EXAM: CT ABDOMEN AND PELVIS WITH CONTRAST TECHNIQUE: Multidetector CT imaging of the abdomen and pelvis was performed using the standard  protocol following bolus administration of intravenous contrast. CONTRAST:  100mL OMNIPAQUE IOHEXOL 300 MG/ML  SOLN COMPARISON:  Right upper quadrant ultrasound dated 12/13/2018 and CT dated 02/13/2017 FINDINGS: Lower chest: The visualized lung bases are clear. No intra-abdominal free air. There is a small free fluid within the pelvis. Hepatobiliary: There is heterogeneous enhancement of the liver concerning for hepatitis. Clinical correlation is recommended. There is mild periportal edema. Cholecystectomy. No retained calcified stone identified in the central CBD. Pancreas: Unremarkable. No pancreatic ductal dilatation or surrounding inflammatory changes. Spleen: Normal in size without focal abnormality. Adrenals/Urinary Tract: Adrenal glands are unremarkable. Kidneys are normal, without renal calculi, focal lesion, or hydronephrosis. Bladder is unremarkable. Stomach/Bowel: There is no bowel obstruction or active inflammation. The appendix is not visualized with certainty. No inflammatory changes identified in the right lower quadrant. Vascular/Lymphatic: No significant vascular findings are present. No enlarged abdominal or pelvic lymph nodes. Reproductive: The uterus is anteverted and grossly unremarkable. The ovaries are unremarkable. No pelvic mass. Other: None Musculoskeletal: No acute or significant osseous findings. IMPRESSION: 1. Findings most concerning for hepatitis. Clinical correlation is recommended. 2. No bowel obstruction. Electronically Signed   By: Elgie CollardArash  Radparvar M.D.   On: 12/14/2018 01:17   [place] Abdomen Limited  Result Date: 12/14/2018 CLINICAL DATA:  Right upper quadrant pain and emesis. Elevated LFTs. History of cholecystectomy. Concern for common bile duct stone. EXAM: ULTRASOUND ABDOMEN LIMITED RIGHT UPPER QUADRANT COMPARISON:  MRCP 05/05/2017 FINDINGS: Gallbladder: Surgically absent. Common bile duct: Diameter: 6 mm at the porta hepatis, distal portion obscured by bowel gas. No  visualized choledocholithiasis. Liver: No focal lesion identified. Within normal limits in parenchymal echogenicity. No intrahepatic biliary ductal dilatation. Portal vein is patent on color Doppler imaging with normal direction of blood flow towards the liver. IMPRESSION: Postcholecystectomy without biliary dilatation. No evidence of choledocholithiasis. Electronically Signed   By: Narda RutherfordMelanie  Sanford M.D.   On: 12/14/2018 00:05    Procedures Procedures (including critical care time)  Medications Ordered in ED Medications  sodium chloride flush (NS) 0.9 % injection 3 mL (3 mLs Intravenous Not Given 12/13/18 2303)  ondansetron (ZOFRAN-ODT) disintegrating tablet 4 mg (4 mg Oral Given 12/13/18 1935)  sodium chloride 0.9 %  bolus 1,000 mL (1,000 mLs Intravenous New Bag/Given 12/13/18 2341)  iohexol (OMNIPAQUE) 300 MG/ML solution 100 mL (100 mLs Intravenous Contrast Given 12/14/18 0046)     Initial Impression / Assessment and Plan / ED Course  I have reviewed the triage vital signs and the nursing notes.  Pertinent labs & imaging results that were available during my care of the patient were reviewed by me and considered in my medical decision making (see chart for details).  Clinical Course as of Dec 13 224  Fri Dec 13, 2018  2318 Elevated AST/ALT  AST(!): 167 [HM]  2318 WNL  Lipase: 34 [HM]  2318 elevated  WBC(!): 18.9 [HM]  2318 afebrile  Temp: 98 F (36.7 C) [HM]  2318 No tachycardia  Pulse Rate: 68 [HM]    Clinical Course User Index [HM] Jahking Lesser, Dahlia ClientHannah, PA-C        Pt with RUQ and epigastric abd pain.  Labs with leukocytosis and elevated liver enzymes.  Pt with hx of cholecystectomy, but concern for CBD stone or issue.  Pt abd soft and nontender on initial exam. Will start with RUQ US to assess CBD.    2:18 AM Pt remains pain free.  US without signs of retained stone or mass in the CBD.  CT scan with enhancement of the liver concerning for possible hepatitis.  Pt reports  EtOH consumption 1x per week, denies acetaminophen usage, IVDU or blood/body fluid exposure, international travel.  Pt denies a hx of hepatitis.  Pt without persistent vomiting in the ED.  Abd remains soft without rebound or guarding.  No juandice.   Hepatitis panel drawn.  Pt will have close follow-up with Dr. Marina GoodellPerry (GI) this week for repeat labwork and further assessment.  Discussed reasons to return to the ED including worsening pain, persistent vomiting or fevers.  Pt states understanding and is in agreement with the plan.    The patient was discussed Dr. Preston FleetingGlick who agrees with the treatment plan.   Final Clinical Impressions(s) / ED Diagnoses   Final diagnoses:  Epigastric pain  Elevated liver enzymes    ED Discharge Orders         Ordered    ondansetron (ZOFRAN ODT) 4 MG disintegrating tablet     12/14/18 0226           Eldrige Pitkin, Dahlia ClientHannah, PA-C 12/14/18 0227    Dione BoozeGlick, David, MD 12/14/18 [phone]

## 2018-12-13 NOTE — Telephone Encounter (Signed)
Having 60-90 mins epigastric pain and nausea No fever Similar to CBD stones/cholelithiasis in past   Getting better at this point  Advised:  Can see how it goes and if resolves we will contact next week w/ f/u plans ([facility] is Henrene Pastor)  If she persists/worsens go to ED for eval and tx and also call back prn

## 2018-12-13 NOTE — ED Triage Notes (Signed)
Per pt today at 4pm she started having abdominal pain with nausea. Upper gastric are. Pt is doubled over in cramps. Pt said had gallbladder removed 2 years ago but feels just like it. No fevers no diarrhea. N

## 2018-12-14 ENCOUNTER — Emergency Department (HOSPITAL_COMMUNITY): Payer: Managed Care, Other (non HMO)

## 2018-12-14 MED ORDER — ONDANSETRON 4 MG PO TBDP: ORAL_TABLET | ORAL | 0 refills | Status: DC

## 2018-12-14 MED ORDER — IOHEXOL 300 MG/ML  SOLN
100.0000 mL | Freq: Once | INTRAMUSCULAR | Status: AC | PRN
  Administered 2018-12-14: 100 mL via INTRAVENOUS

## 2018-12-14 NOTE — Telephone Encounter (Signed)
Thanks Glendell Docker.  Vaughan Basta, I reviewed ER workup. She how she is feeling and obtain a repeat set of LFT's. Thanks  jp

## 2018-12-14 NOTE — Discharge Instructions (Addendum)
1. Medications: zofran, usual home medications 2. Treatment: rest, drink plenty of fluids, advance diet slowly; do not drink alcohol, do not take medications with tylenol in them 3. Follow Up: Please followup with your gastroenterology in 2 days for discussion of your diagnoses and further evaluation after today's visit; Please return to the ER for persistent vomiting, high fevers, worsening pain or other symptoms

## 2018-12-16 ENCOUNTER — Telehealth: Payer: Self-pay | Admitting: Gastroenterology

## 2018-12-16 ENCOUNTER — Other Ambulatory Visit (INDEPENDENT_AMBULATORY_CARE_PROVIDER_SITE_OTHER): Payer: Managed Care, Other (non HMO)

## 2018-12-16 DIAGNOSIS — R7989 Other specified abnormal findings of blood chemistry: Secondary | ICD-10-CM

## 2018-12-16 DIAGNOSIS — R945 Abnormal results of liver function studies: Secondary | ICD-10-CM | POA: Diagnosis not present

## 2018-12-16 LAB — HEPATIC FUNCTION PANEL
ALT: 168 U/L — ABNORMAL HIGH (ref 0–35)
AST: 38 U/L — ABNORMAL HIGH (ref 0–37)
Albumin: 4.5 g/dL (ref 3.5–5.2)
Alkaline Phosphatase: 72 U/L (ref 39–117)
Bilirubin, Direct: 0.2 mg/dL (ref 0.0–0.3)
Total Bilirubin: 0.8 mg/dL (ref 0.2–1.2)
Total Protein: 7.5 g/dL (ref 6.0–8.3)

## 2018-12-16 LAB — HEPATITIS PANEL, ACUTE
HCV Ab: <0.1 s/co ratio (ref 0.0–0.9)
Hep A IgM: NEGATIVE
Hep B C IgM: NEGATIVE
Hepatitis B Surface Ag: NEGATIVE

## 2018-12-16 NOTE — Telephone Encounter (Signed)
Lab was ordered per request from Dr Henrene Pastor.  Pt aware to come in at her convenience. She will keep appt as planned.  She also states she has felt really good all weekend.  She will call if symptoms return.

## 2018-12-16 NOTE — Telephone Encounter (Signed)
Pt has OV this Thursday and requested to have orders placed for lab works before appt.

## 2018-12-17 ENCOUNTER — Other Ambulatory Visit: Payer: Self-pay

## 2018-12-17 DIAGNOSIS — R7989 Other specified abnormal findings of blood chemistry: Secondary | ICD-10-CM

## 2018-12-18 ENCOUNTER — Telehealth: Payer: Self-pay

## 2018-12-18 NOTE — Telephone Encounter (Signed)
Covid-19 screening questions   Do you now or have you had a fever in the last 14 days? No  Do you have any respiratory symptoms of shortness of breath or cough now or in the last 14 days? No  Do you have any family members or close contacts with diagnosed or suspected Covid-19 in the past 14 days? No  Have you been tested for Covid-19 and found to be positive? No        

## 2018-12-19 ENCOUNTER — Encounter: Payer: Self-pay | Admitting: Gastroenterology

## 2018-12-19 ENCOUNTER — Ambulatory Visit (INDEPENDENT_AMBULATORY_CARE_PROVIDER_SITE_OTHER): Payer: Managed Care, Other (non HMO) | Admitting: Gastroenterology

## 2018-12-19 VITALS — BP 90/60 | HR 71 | Temp 98.3°F | Ht 64.0 in | Wt 125.0 lb

## 2018-12-19 DIAGNOSIS — R1013 Epigastric pain: Secondary | ICD-10-CM

## 2018-12-19 DIAGNOSIS — R7989 Other specified abnormal findings of blood chemistry: Secondary | ICD-10-CM

## 2018-12-19 DIAGNOSIS — R945 Abnormal results of liver function studies: Secondary | ICD-10-CM

## 2018-12-19 NOTE — Progress Notes (Signed)
   12/19/2018 Sierra Hernandez [id number] 03/02/1991   HISTORY OF PRESENT ILLNESS: This is a pleasant 27-year-old female who is a patient of Dr. Perry's.  She is known to us from 2018 when she had issues with choledocholithiasis and underwent cholecystectomy and ERCP with stone removal and sphincterotomy.  She presents to our office today as a follow-up from her recent ER visit.  She tells me that on Friday, July 17 she was sitting doing some work and had very sudden onset of epigastric abdominal pain that was exactly the same pain she had previously with gallstones.  Pain continued to be very severe for 2 hours so she called here and was instructed to go to the emergency department.  She did so and by the time she actually was placed into a room it was about 4 hours total since her pain had began and her pain suddenly resolved as quickly as it had started.  She was evaluated and found to have elevated LFTs with AST of 167, ALT 117, normal alk phos, normal total bilirubin.  Ultrasound and CT scan were both unremarkable for any sign of CBD stones or biliary dilatation suggestive of such.  CT scan suggested findings concerning for hepatitis, but ultrasound did not suggest any such findings.  Acute hepatitis panel was negative.  She currently feels well, with no residual pain.  She tells me that she had one similar issue shortly after having her gallbladder removed, December 2018.  This was associated with elevated LFTs as well.    Past Medical History:  Diagnosis Date  . Asthma   . Elevated LFTs   . Gallstones   . IBS (irritable bowel syndrome)   . Pancreatitis 01/2017  . Scoliosis    Past Surgical History:  Procedure Laterality Date  . CHOLECYSTECTOMY N/A 02/05/2017   Procedure: LAPAROSCOPIC CHOLECYSTECTOMY WITH INTRAOPERATIVE CHOLANGIOGRAM;  Surgeon: Cornett, Thomas, MD;  Location: [facility] OR;  Service: General;  Laterality: N/A;  . ERCP N/A 02/06/2017   Procedure: ENDOSCOPIC RETROGRADE  CHOLANGIOPANCREATOGRAPHY (ERCP);  Surgeon: Danis, Henry L III, MD;  Location: [facility] ENDOSCOPY;  Service: Gastroenterology;  Laterality: N/A;  . MOLE REMOVAL     x 2 on back    reports that she has never smoked. She has never used smokeless tobacco. She reports current alcohol use. She reports that she does not use drugs. family history includes Arthritis in her maternal grandmother and paternal grandmother; Gallstones in her maternal grandfather; Heart disease in her maternal grandmother; Hyperlipidemia in her father; Melanoma in her mother; Thyroid cancer in her paternal grandfather. Allergies  Allergen Reactions  . Augmentin [Amoxicillin-Pot Clavulanate]     Itching ? If related to complications with gallbladder or with augmentin  . Minocycline Swelling and Other (See Comments)    Joint pain and swelling, exhaustion      Outpatient Encounter Medications as of 12/19/2018  Medication Sig  . EPINEPHrine (EPIPEN 2-PAK) 0.3 mg/0.3 mL IJ SOAJ injection Inject 0.3 mg into the muscle once as needed (severe allergic reaction).  . fexofenadine (ALLEGRA) 60 MG tablet Take 60 mg by mouth daily.  . ibuprofen (ADVIL,MOTRIN) 200 MG tablet Take 200 mg by mouth 2 (two) times daily as needed for headache (pain).  . magnesium 30 MG tablet Take 30 mg by mouth 2 (two) times daily.  . Multiple Vitamin (MULTIVITAMIN WITH MINERALS) TABS tablet Take 1 tablet by mouth daily.  . naproxen sodium (ALEVE) 220 MG tablet Take 220-440 mg by mouth 2 (two) times daily as   needed (pain/headache).   . norgestimate-ethinyl estradiol (ORTHO-CYCLEN,SPRINTEC,PREVIFEM) 0.25-35 MG-MCG tablet Take 1 tablet by mouth at bedtime.   . polyethylene glycol (MIRALAX / GLYCOLAX) packet Take 17 g daily as needed by mouth (constipation). Mix in 8 oz liquid and drink   . Probiotic Product (ALIGN) 4 MG CAPS Take 1 capsule daily by mouth.  . Triamcinolone Acetonide (NASACORT ALLERGY 24HR NA) Place into the nose daily.  . [DISCONTINUED] cetirizine  (ZYRTEC) 10 MG tablet Take 10 mg by mouth at bedtime.   . [DISCONTINUED] ondansetron (ZOFRAN ODT) 4 MG disintegrating tablet 61m ODT q4 hours prn nausea/vomit  . [DISCONTINUED] ondansetron (ZOFRAN) 4 MG tablet Take 1 tablet (4 mg total) by mouth every 8 (eight) hours as needed for nausea or vomiting.   No facility-administered encounter medications on file as of 12/19/2018.      REVIEW OF SYSTEMS  : All other systems reviewed and negative except where noted in the History of Present Illness.   PHYSICAL EXAM: BP 90/60   Pulse 71   Temp 98.3 F (36.8 C)   Ht 5' 4" (1.626 m)   Wt 125 lb (56.7 kg)   LMP 12/06/2018 (Approximate)   BMI 21.46 kg/m  General: Well developed white female in no acute distress Head: Normocephalic and atraumatic Eyes:  Sclerae anicteric, conjunctiva pink. Ears: Normal auditory acuity Lungs: Clear throughout to auscultation; no increased WOB. Heart: Regular rate and rhythm; no M/R/G. Abdomen: Soft, non-distended.  BS present.  Non-tender. Musculoskeletal: Symmetrical with no gross deformities  Skin: No lesions on visible extremities Extremities: No edema  Neurological: Alert oriented x 4, grossly non-focal Psychological:  Alert and cooperative. Normal mood and affect  ASSESSMENT AND PLAN: *28year old female who is status post cholecystectomy and previous ERCP for choledocholithiasis in September 2018.  Recently had an episode of sudden onset epigastric abdominal pain that lasted for hours and then completely resolved by the time she was even taken to her room in the emergency department.  This is associated with elevated LFTs, but imaging studies negative for sign of CBD stones.  She is undoubtedly passing gallstones intermittently which is the cause of her pain and elevated LFTs.  Currently is symptom-free.  She already has orders placed to have her LFTs rechecked again in 2 weeks to see if they returned to normal.  We discussed possible use of ursodiol to try  to prevent recurrence of this issue, but we agreed that unless it becomes a more frequent issue then we will avoid this for now.  She has Zofran at home that she can use as needed and I did offer her pain medication to have on hand in case she were to have another episode, but she declined for now.  She will contact [place] any further episodes.  **25 minutes were spent with the patient of which at least 50% was used for counseling of her condition and treatment options.   CC:  Brauning, GChristianne Borrow *

## 2018-12-19 NOTE — Patient Instructions (Signed)
If you are age 27 or older, your body mass index should be between 23-30. Your Body mass index is 21.46 kg/m. If this is out of the aforementioned range listed, please consider follow up with your Primary Care Provider.  If you are age 68 or younger, your body mass index should be between 19-25. Your Body mass index is 21.46 kg/m. If this is out of the aformentioned range listed, please consider follow up with your Primary Care Provider.   We will call you with lab results.  Follow up as needed.  Thank you for choosing me and [facility].   Alonza Bogus, PA-C

## 2018-12-20 ENCOUNTER — Encounter: Payer: Self-pay | Admitting: Gastroenterology

## 2018-12-20 DIAGNOSIS — R1013 Epigastric pain: Secondary | ICD-10-CM | POA: Insufficient documentation

## 2018-12-20 NOTE — Progress Notes (Signed)
Reviewed and agree with expectant management for likely passed choledocholithe

## 2018-12-30 ENCOUNTER — Other Ambulatory Visit (INDEPENDENT_AMBULATORY_CARE_PROVIDER_SITE_OTHER): Payer: Managed Care, Other (non HMO)

## 2018-12-30 DIAGNOSIS — R945 Abnormal results of liver function studies: Secondary | ICD-10-CM

## 2018-12-30 DIAGNOSIS — R7989 Other specified abnormal findings of blood chemistry: Secondary | ICD-10-CM

## 2018-12-30 LAB — HEPATIC FUNCTION PANEL
ALT: 30 U/L (ref 0–35)
AST: 16 U/L (ref 0–37)
Albumin: 4.3 g/dL (ref 3.5–5.2)
Alkaline Phosphatase: 45 U/L (ref 39–117)
Bilirubin, Direct: 0.1 mg/dL (ref 0.0–0.3)
Total Bilirubin: 0.4 mg/dL (ref 0.2–1.2)
Total Protein: 7.3 g/dL (ref 6.0–8.3)

## 2020-06-16 ENCOUNTER — Other Ambulatory Visit: Payer: Self-pay

## 2020-06-17 ENCOUNTER — Ambulatory Visit (INDEPENDENT_AMBULATORY_CARE_PROVIDER_SITE_OTHER): Payer: Managed Care, Other (non HMO) | Admitting: Family Medicine

## 2020-06-17 ENCOUNTER — Encounter: Payer: Self-pay | Admitting: Family Medicine

## 2020-06-17 ENCOUNTER — Other Ambulatory Visit (HOSPITAL_COMMUNITY)
Admission: RE | Admit: 2020-06-17 | Discharge: 2020-06-17 | Disposition: A | Discharge status: Home / Self Care | Payer: Managed Care, Other (non HMO) | Source: Ambulatory Visit | Attending: Family Medicine | Admitting: Family Medicine

## 2020-06-17 VITALS — BP 116/68 | HR 83 | Temp 97.6°F | Ht 65.0 in | Wt 137.4 lb

## 2020-06-17 DIAGNOSIS — Z7689 Persons encountering health services in other specified circumstances: Secondary | ICD-10-CM

## 2020-06-17 DIAGNOSIS — Z124 Encounter for screening for malignant neoplasm of cervix: Secondary | ICD-10-CM

## 2020-06-17 DIAGNOSIS — R7989 Other specified abnormal findings of blood chemistry: Secondary | ICD-10-CM

## 2020-06-17 DIAGNOSIS — Z13220 Encounter for screening for lipoid disorders: Secondary | ICD-10-CM | POA: Diagnosis not present

## 2020-06-17 DIAGNOSIS — Z0000 Encounter for general adult medical examination without abnormal findings: Secondary | ICD-10-CM | POA: Diagnosis not present

## 2020-06-17 LAB — CBC
HCT: 38.5 % (ref 36.0–46.0)
Hemoglobin: 12.8 g/dL (ref 12.0–15.0)
MCHC: 33.3 g/dL (ref 30.0–36.0)
MCV: 89.3 fl (ref 78.0–100.0)
Platelets: 269 10*3/uL (ref 150.0–400.0)
RBC: 4.31 Mil/uL (ref 3.87–5.11)
RDW: 12.6 % (ref 11.5–15.5)
WBC: 6.3 10*3/uL (ref 4.0–10.5)

## 2020-06-17 LAB — HEPATIC FUNCTION PANEL
ALT: 41 U/L — ABNORMAL HIGH (ref 0–35)
AST: 20 U/L (ref 0–37)
Albumin: 4.6 g/dL (ref 3.5–5.2)
Alkaline Phosphatase: 42 U/L (ref 39–117)
Bilirubin, Direct: 0.1 mg/dL (ref 0.0–0.3)
Total Bilirubin: 0.3 mg/dL (ref 0.2–1.2)
Total Protein: 7.4 g/dL (ref 6.0–8.3)

## 2020-06-17 LAB — LIPID PANEL
Cholesterol: 155 mg/dL (ref 0–200)
HDL: 53.8 mg/dL (ref >39.00)
LDL Cholesterol: 82 mg/dL (ref 0–99)
NonHDL: 100.91
Total CHOL/HDL Ratio: 3
Triglycerides: 95 mg/dL (ref 0.0–149.0)
VLDL: 19 mg/dL (ref 0.0–40.0)

## 2020-06-17 LAB — BASIC METABOLIC PANEL
BUN: 20 mg/dL (ref 6–23)
CO2: 28 mEq/L (ref 19–32)
Calcium: 9.7 mg/dL (ref 8.4–10.5)
Chloride: 103 mEq/L (ref 96–112)
Creatinine, Ser: 0.93 mg/dL (ref 0.40–1.20)
GFR: 83.88 mL/min (ref >60.00)
Glucose, Bld: 91 mg/dL (ref 70–99)
Potassium: 4 mEq/L (ref 3.5–5.1)
Sodium: 137 mEq/L (ref 135–145)

## 2020-06-17 NOTE — Patient Instructions (Addendum)
[facility] [address] [place], [place], [place] [address] Phone: [phone]  [facility] [address], [place], [place] [address] Phone: [phone]   Health Maintenance, Female Adopting a healthy lifestyle and getting preventive care are important in promoting health and wellness. Ask your health care provider about:  The right schedule for you to have regular tests and exams.  Things you can do on your own to prevent diseases and keep yourself healthy. What should I know about diet, weight, and exercise? Eat a healthy diet  Eat a diet that includes plenty of vegetables, fruits, low-fat dairy products, and lean protein.  Do not eat a lot of foods that are high in solid fats, added sugars, or sodium.   Maintain a healthy weight Body mass index (BMI) is used to identify weight problems. It estimates body fat based on height and weight. Your health care provider can help determine your BMI and help you achieve or maintain a healthy weight. Get regular exercise Get regular exercise. This is one of the most important things you can do for your health. Most adults should:  Exercise for at least 150 minutes each week. The exercise should increase your heart rate and make you sweat (moderate-intensity exercise).  Do strengthening exercises at least twice a week. This is in addition to the moderate-intensity exercise.  Spend less time sitting. Even light physical activity can be beneficial. Watch cholesterol and blood lipids Have your blood tested for lipids and cholesterol at 29 years of age, then have this test every 5 years. Have your cholesterol levels checked more often if:  Your lipid or cholesterol levels are high.  You are older than 29 years of age.  You are at high risk for heart disease. What should I know about cancer screening? Depending on your health history and family history, you may need to have cancer screening at various ages. This may include  screening for:  Breast cancer.  Cervical cancer.  Colorectal cancer.  Skin cancer.  Lung cancer. What should I know about heart disease, diabetes, and high blood pressure? Blood pressure and heart disease  High blood pressure causes heart disease and increases the risk of stroke. This is more likely to develop in people who have high blood pressure readings, are of African descent, or are overweight.  Have your blood pressure checked: ? Every 3-5 years if you are 60-60 years of age. ? Every year if you are 62 years old or older. Diabetes Have regular diabetes screenings. This checks your fasting blood sugar level. Have the screening done:  Once every three years after age 8 if you are at a normal weight and have a low risk for diabetes.  More often and at a younger age if you are overweight or have a high risk for diabetes. What should I know about preventing infection? Hepatitis B If you have a higher risk for hepatitis B, you should be screened for this virus. Talk with your health care provider to find out if you are at risk for hepatitis B infection. Hepatitis C Testing is recommended for:  Everyone born from [birth year] through 1964.  Anyone with known risk factors for hepatitis C. Sexually transmitted infections (STIs)  Get screened for STIs, including gonorrhea and chlamydia, if: ? You are sexually active and are younger than 29 years of age. ? You are older than 29 years of age and your health care provider tells you that you are at risk  for this type of infection. ? Your sexual activity has changed since you were last screened, and you are at increased risk for chlamydia or gonorrhea. Ask your health care provider if you are at risk.  Ask your health care provider about whether you are at high risk for HIV. Your health care provider may recommend a prescription medicine to help prevent HIV infection. If you choose to take medicine to prevent HIV, you should first get  tested for HIV. You should then be tested every 3 months for as long as you are taking the medicine. Pregnancy  If you are about to stop having your period (premenopausal) and you may become pregnant, seek counseling before you get pregnant.  Take 400 to 800 micrograms (mcg) of folic acid every day if you become pregnant.  Ask for birth control (contraception) if you want to prevent pregnancy. Osteoporosis and menopause Osteoporosis is a disease in which the bones lose minerals and strength with aging. This can result in bone fractures. If you are 35 years old or older, or if you are at risk for osteoporosis and fractures, ask your health care provider if you should:  Be screened for bone loss.  Take a calcium or vitamin D supplement to lower your risk of fractures.  Be given hormone replacement therapy (HRT) to treat symptoms of menopause. Follow these instructions at home: Lifestyle  Do not use any products that contain nicotine or tobacco, such as cigarettes, e-cigarettes, and chewing tobacco. If you need help quitting, ask your health care provider.  Do not use street drugs.  Do not share needles.  Ask your health care provider for help if you need support or information about quitting drugs. Alcohol use  Do not drink alcohol if: ? Your health care provider tells you not to drink. ? You are pregnant, may be pregnant, or are planning to become pregnant.  If you drink alcohol: ? Limit how much you use to 0-1 drink a day. ? Limit intake if you are breastfeeding.  Be aware of how much alcohol is in your drink. In the [place] one drink equals one 12 oz bottle of beer (355 mL), one 5 oz glass of wine (148 mL), or one 1 oz glass of hard liquor (44 mL). General instructions  Schedule regular health, dental, and eye exams.  Stay current with your vaccines.  Tell your health care provider if: ? You often feel depressed. ? You have ever been abused or do not feel safe at  home. Summary  Adopting a healthy lifestyle and getting preventive care are important in promoting health and wellness.  Follow your health care provider's instructions about healthy diet, exercising, and getting tested or screened for diseases.  Follow your health care provider's instructions on monitoring your cholesterol and blood pressure. This information is not intended to replace advice given to you by your health care provider. Make sure you discuss any questions you have with your health care provider. Document Revised: 05/08/2018 Document Reviewed: 05/08/2018 Elsevier Patient Education  2021 [employer].

## 2020-06-17 NOTE — Progress Notes (Signed)
Sierra Hernandez is a 30 y.o. female  Chief Complaint  Patient presents with  . Establish Care    NP- CPE/labs.  No concerns.       HPI: Sierra Hernandez is a 30 y.o. female seen today to establish care with our office and for annual CPE, labs. She moved to [place] from [place] 4 years ago but continued to see PCP w/ [facility] in [place].  She has no acute issues or concerns.  She did eat about 90 min ago.   Last PAP: was due in 12/2019; no h/o abnormal PAP  Dental: has appt in 07/2020 Vision: wears glasses and due for exam    Past Medical History:  Diagnosis Date  . Allergy   . Asthma   . Elevated LFTs   . Gallstones   . IBS (irritable bowel syndrome)   . Pancreatitis 01/2017  . Scoliosis     Past Surgical History:  Procedure Laterality Date  . CHOLECYSTECTOMY N/A 02/05/2017   Procedure: LAPAROSCOPIC CHOLECYSTECTOMY WITH INTRAOPERATIVE CHOLANGIOGRAM;  Surgeon: Harriette Bouillon, MD;  Location: [facility] OR;  Service: General;  Laterality: N/A;  . ERCP N/A 02/06/2017   Procedure: ENDOSCOPIC RETROGRADE CHOLANGIOPANCREATOGRAPHY (ERCP);  Surgeon: Sherrilyn Rist, MD;  Location: [facility] ENDOSCOPY;  Service: Gastroenterology;  Laterality: N/A;  . MOLE REMOVAL     x 2 on back    Social History   Socioeconomic History  . Marital status: Significant Other    Spouse name: Not on file  . Number of children: Not on file  . Years of education: Not on file  . Highest education level: Not on file  Occupational History  . Not on file  Tobacco Use  . Smoking status: Never Smoker  . Smokeless tobacco: Never Used  Vaping Use  . Vaping Use: Never used  Substance and Sexual Activity  . Alcohol use: Yes    Comment: ~2x per month  . Drug use: No  . Sexual activity: Yes  Other Topics Concern  . Not on file  Social History Narrative  . Not on file   Social Determinants of Health   Financial Resource Strain: Not on file  Food Insecurity: Not on file  Transportation Needs: Not on file   Physical Activity: Not on file  Stress: Not on file  Social Connections: Not on file  Intimate Partner Violence: Not on file    Family History  Problem Relation Age of Onset  . Melanoma Mother   . Hyperlipidemia Father   . Heart disease Maternal Grandmother   . Arthritis Maternal Grandmother   . Gallstones Maternal Grandfather   . Arthritis Paternal Grandmother   . Thyroid cancer Paternal Grandfather   . Colon cancer Neg Hx   . Stomach cancer Neg Hx   . Pancreatic cancer Neg Hx   . Liver disease Neg Hx      Immunization History  Administered Date(s) Administered  . Influenza-Unspecified 03/26/2020  . PFIZER(Purple Top)SARS-COV-2 Vaccination 08/23/2019, 09/16/2019, 04/24/2020    Outpatient Encounter Medications as of 06/17/2020  Medication Sig  . clindamycin-benzoyl peroxide (BENZACLIN) gel Apply 1 application topically every morning.  . fexofenadine (ALLEGRA) 60 MG tablet Take 60 mg by mouth daily.  Marland Kitchen ibuprofen (ADVIL,MOTRIN) 200 MG tablet Take 200 mg by mouth 2 (two) times daily as needed for headache (pain).  . magnesium 30 MG tablet Take 30 mg by mouth 2 (two) times daily.  . Multiple Vitamin (MULTIVITAMIN WITH MINERALS) TABS tablet Take 1 tablet by mouth daily.  Marland Kitchen  naproxen sodium (ALEVE) 220 MG tablet Take 220-440 mg by mouth 2 (two) times daily as needed (pain/headache).   . norgestimate-ethinyl estradiol (ORTHO-CYCLEN,SPRINTEC,PREVIFEM) 0.25-35 MG-MCG tablet Take 1 tablet by mouth at bedtime.   . polyethylene glycol (MIRALAX / GLYCOLAX) packet Take 17 g by mouth daily as needed (constipation). Mix in 8 oz liquid and drink  . spironolactone (ALDACTONE) 50 MG tablet   . tretinoin (RETIN-A) 0.025 % cream Apply 1 application topically at bedtime.  . Triamcinolone Acetonide (NASACORT ALLERGY 24HR NA) Place into the nose daily.  . [DISCONTINUED] EPINEPHrine (EPIPEN 2-PAK) 0.3 mg/0.3 mL IJ SOAJ injection Inject 0.3 mg into the muscle once as needed (severe allergic reaction).   . [DISCONTINUED] Probiotic Product (ALIGN) 4 MG CAPS Take 1 capsule daily by mouth.   No facility-administered encounter medications on file as of 06/17/2020.     ROS: Gen: no fever, chills  Skin: no rash, itching ENT: no ear pain, ear drainage, nasal congestion, rhinorrhea, sinus pressure, sore throat Eyes: no blurry vision, double vision Resp: no cough, wheeze,SOB Breast: no breast tenderness, no nipple discharge, no breast masses CV: no CP, palpitations, LE edema,  GI: no heartburn, n/v/d/c, abd pain GU: no dysuria, urgency, frequency, hematuria; no vaginal itching, odor, discharge MSK: no joint pain, myalgias, back pain Neuro: no dizziness, headache, weakness, vertigo Psych: no depression, anxiety, insomnia   Allergies  Allergen Reactions  . Augmentin [Amoxicillin-Pot Clavulanate]     Itching ? If related to complications with gallbladder or with augmentin  . Minocycline Swelling and Other (See Comments)    Joint pain and swelling, exhaustion    BP 116/68   Pulse 83   Temp 97.6 F (36.4 C) (Temporal)   Ht 5\' 5"  (1.651 m)   Wt 137 lb 6.4 oz (62.3 kg)   SpO2 99%   BMI 22.86 kg/m   Physical Exam Exam conducted with a chaperone present.  Constitutional:      General: She is not in acute distress.    Appearance: She is well-developed and well-nourished.  HENT:     Head: Normocephalic and atraumatic.     Right Ear: Tympanic membrane and ear canal normal.     Left Ear: Tympanic membrane and ear canal normal.     Nose: Nose normal.     Mouth/Throat:     Mouth: Oropharynx is clear and moist and mucous membranes are normal.  Eyes:     Conjunctiva/sclera: Conjunctivae normal.     Pupils: Pupils are equal, round, and reactive to light.  Neck:     Thyroid: No thyromegaly.  Cardiovascular:     Rate and Rhythm: Normal rate and regular rhythm.     Pulses: Intact distal pulses.     Heart sounds: Normal heart sounds. No murmur heard.   Pulmonary:     Effort:  Pulmonary effort is normal. No respiratory distress.     Breath sounds: Normal breath sounds. No wheezing or rhonchi.  Abdominal:     General: Bowel sounds are normal. There is no distension.     Palpations: Abdomen is soft. There is no mass.     Tenderness: There is no abdominal tenderness.  Genitourinary:    Labia:        Right: No rash, tenderness or lesion.        Left: No rash, tenderness or lesion.      Vagina: Normal. No vaginal discharge, erythema, tenderness or bleeding.     Cervix: No cervical motion tenderness, friability, erythema or  cervical bleeding.     Uterus: Normal.      Adnexa: Right adnexa normal and left adnexa normal.  Musculoskeletal:        General: No edema.     Cervical back: Neck supple.     Right lower leg: No edema.     Left lower leg: No edema.  Lymphadenopathy:     Cervical: No cervical adenopathy.  Skin:    General: Skin is warm and dry.  Neurological:     Mental Status: She is alert and oriented to person, place, and time.     Motor: No abnormal muscle tone.     Coordination: Coordination normal.  Psychiatric:        Mood and Affect: Mood and affect normal.        Behavior: Behavior normal.      A/P:  1. Encounter to establish care with new doctor   2. Annual physical exam - discussed importance of regular CV exercise, healthy diet, adequate sleep - has dental exam scheduled and needs to schedule vision exam - PAP today - Basic metabolic panel - CBC - next CPE in 1 year  3. Elevated LFTs - Hepatic function panel  4. Screening for lipid disorders - Lipid panel  5. Screening for cervical cancer - Cytology - PAP( [place])   This visit occurred during the SARS-CoV-2 public health emergency.  Safety protocols were in place, including screening questions prior to the visit, additional usage of staff PPE, and extensive cleaning of exam room while observing appropriate contact time as indicated for disinfecting solutions.

## 2020-06-21 LAB — CYTOLOGY - PAP
Adequacy: ABNORMAL
Comment: NEGATIVE
High risk HPV: NEGATIVE

## 2020-07-01 ENCOUNTER — Encounter: Payer: Self-pay | Admitting: Family Medicine

## 2020-07-13 ENCOUNTER — Other Ambulatory Visit: Payer: Self-pay

## 2020-07-14 ENCOUNTER — Ambulatory Visit (INDEPENDENT_AMBULATORY_CARE_PROVIDER_SITE_OTHER): Payer: Managed Care, Other (non HMO) | Admitting: Family Medicine

## 2020-07-14 ENCOUNTER — Encounter: Payer: Self-pay | Admitting: Family Medicine

## 2020-07-14 ENCOUNTER — Other Ambulatory Visit (HOSPITAL_COMMUNITY)
Admission: RE | Admit: 2020-07-14 | Discharge: 2020-07-14 | Disposition: A | Discharge status: Home / Self Care | Payer: Managed Care, Other (non HMO) | Source: Ambulatory Visit | Attending: Family Medicine | Admitting: Family Medicine

## 2020-07-14 VITALS — BP 116/70 | HR 95 | Temp 98.3°F | Ht 65.0 in | Wt 134.2 lb

## 2020-07-14 DIAGNOSIS — Z124 Encounter for screening for malignant neoplasm of cervix: Secondary | ICD-10-CM | POA: Diagnosis present

## 2020-07-14 DIAGNOSIS — Z3041 Encounter for surveillance of contraceptive pills: Secondary | ICD-10-CM

## 2020-07-14 MED ORDER — NORGESTIMATE-ETH ESTRADIOL 0.25-35 MG-MCG PO TABS
1.0000 | ORAL_TABLET | Freq: Every day | ORAL | 3 refills | Status: DC
Start: 2020-07-14 — End: 2021-06-21

## 2020-07-14 NOTE — Progress Notes (Signed)
Sierra Hernandez is a 30 y.o. female  Chief Complaint  Patient presents with  . Gynecologic Exam    Repeat pap     HPI: Sierra Hernandez is a 30 y.o. female seen today for repeat PAP due to scant cellularity.  FDLMP: about 06/13/20 H/o abnormal PAP - no Abnormal vaginal bleeding - no Vaginal itching, odor, discharge - no She needs a refill of her OCP.   Past Medical History:  Diagnosis Date  . Allergy   . Asthma   . Elevated LFTs   . Gallstones   . IBS (irritable bowel syndrome)   . Pancreatitis 01/2017  . Scoliosis     Past Surgical History:  Procedure Laterality Date  . CHOLECYSTECTOMY N/A 02/05/2017   Procedure: LAPAROSCOPIC CHOLECYSTECTOMY WITH INTRAOPERATIVE CHOLANGIOGRAM;  Surgeon: Harriette Bouillon, MD;  Location: [facility] OR;  Service: General;  Laterality: N/A;  . ERCP N/A 02/06/2017   Procedure: ENDOSCOPIC RETROGRADE CHOLANGIOPANCREATOGRAPHY (ERCP);  Surgeon: Sherrilyn Rist, MD;  Location: [facility] ENDOSCOPY;  Service: Gastroenterology;  Laterality: N/A;  . MOLE REMOVAL     x 2 on back    Social History   Socioeconomic History  . Marital status: Significant Other    Spouse name: Not on file  . Number of children: Not on file  . Years of education: Not on file  . Highest education level: Not on file  Occupational History  . Not on file  Tobacco Use  . Smoking status: Never Smoker  . Smokeless tobacco: Never Used  Vaping Use  . Vaping Use: Never used  Substance and Sexual Activity  . Alcohol use: Yes    Comment: ~2x per month  . Drug use: No  . Sexual activity: Yes  Other Topics Concern  . Not on file  Social History Narrative  . Not on file   Social Determinants of Health   Financial Resource Strain: Not on file  Food Insecurity: Not on file  Transportation Needs: Not on file  Physical Activity: Not on file  Stress: Not on file  Social Connections: Not on file  Intimate Partner Violence: Not on file    Family History  Problem Relation Age of  Onset  . Melanoma Mother   . Hyperlipidemia Father   . Heart disease Maternal Grandmother   . Arthritis Maternal Grandmother   . Gallstones Maternal Grandfather   . Arthritis Paternal Grandmother   . Thyroid cancer Paternal Grandfather   . Colon cancer Neg Hx   . Stomach cancer Neg Hx   . Pancreatic cancer Neg Hx   . Liver disease Neg Hx      Immunization History  Administered Date(s) Administered  . Influenza-Unspecified 03/26/2020  . PFIZER(Purple Top)SARS-COV-2 Vaccination 08/23/2019, 09/16/2019, 04/24/2020    Outpatient Encounter Medications as of 07/14/2020  Medication Sig  . clindamycin-benzoyl peroxide (BENZACLIN) gel Apply 1 application topically every morning.  . fexofenadine (ALLEGRA) 60 MG tablet Take 60 mg by mouth daily.  Marland Kitchen ibuprofen (ADVIL,MOTRIN) 200 MG tablet Take 200 mg by mouth 2 (two) times daily as needed for headache (pain).  . magnesium 30 MG tablet Take 30 mg by mouth 2 (two) times daily.  . Multiple Vitamin (MULTIVITAMIN WITH MINERALS) TABS tablet Take 1 tablet by mouth daily.  . naproxen sodium (ALEVE) 220 MG tablet Take 220-440 mg by mouth 2 (two) times daily as needed (pain/headache).   . norgestimate-ethinyl estradiol (ORTHO-CYCLEN,SPRINTEC,PREVIFEM) 0.25-35 MG-MCG tablet Take 1 tablet by mouth at bedtime.   . polyethylene glycol (MIRALAX /  GLYCOLAX) packet Take 17 g by mouth daily as needed (constipation). Mix in 8 oz liquid and drink  . spironolactone (ALDACTONE) 50 MG tablet   . tretinoin (RETIN-A) 0.025 % cream Apply 1 application topically at bedtime.  . Triamcinolone Acetonide (NASACORT ALLERGY 24HR NA) Place into the nose daily.   No facility-administered encounter medications on file as of 07/14/2020.     ROS: Pertinent positives and negatives noted in HPI. Remainder of ROS non-contributory    Allergies  Allergen Reactions  . Augmentin [Amoxicillin-Pot Clavulanate]     Itching ? If related to complications with gallbladder or with  augmentin  . Minocycline Swelling and Other (See Comments)    Joint pain and swelling, exhaustion    BP 116/70   Pulse 95   Temp 98.3 F (36.8 C) (Temporal)   Ht 5\' 5"  (1.651 m)   Wt 134 lb 3.2 oz (60.9 kg)   SpO2 98%   BMI 22.33 kg/m   Wt Readings from Last 3 Encounters:  07/14/20 134 lb 3.2 oz (60.9 kg)  06/17/20 137 lb 6.4 oz (62.3 kg)  12/19/18 125 lb (56.7 kg)   Temp Readings from Last 3 Encounters:  07/14/20 98.3 F (36.8 C) (Temporal)  06/17/20 97.6 F (36.4 C) (Temporal)  12/19/18 98.3 F (36.8 C)   BP Readings from Last 3 Encounters:  07/14/20 116/70  06/17/20 116/68  12/19/18 90/60   Pulse Readings from Last 3 Encounters:  07/14/20 95  06/17/20 83  12/19/18 71     Physical Exam Exam conducted with a chaperone present.  Constitutional:      General: She is not in acute distress.    Appearance: Normal appearance. She is not ill-appearing.  Genitourinary:    General: Normal vulva.     Labia:        Right: No rash, tenderness or lesion.        Left: No rash, tenderness or lesion.      Vagina: No vaginal discharge, erythema, tenderness or bleeding.     Cervix: No cervical motion tenderness, discharge, friability, lesion, erythema or cervical bleeding.     Uterus: Normal.   Neurological:     Mental Status: She is alert and oriented to person, place, and time.  Psychiatric:        Mood and Affect: Mood normal.        Behavior: Behavior normal.      A/P:  1. Screening for cervical cancer - Cytology - PAP( [place])  2. Encounter for birth control pills maintenance Refill: - norgestimate-ethinyl estradiol (ORTHO-CYCLEN) 0.25-35 MG-MCG tablet; Take 1 tablet by mouth at bedtime.  Dispense: 84 tablet; Refill: 3 - f/u in 1 year or sooner PRN   This visit occurred during the SARS-CoV-2 public health emergency.  Safety protocols were in place, including screening questions prior to the visit, additional usage of staff PPE, and extensive cleaning of  exam room while observing appropriate contact time as indicated for disinfecting solutions.

## 2020-07-19 LAB — CYTOLOGY - PAP: Diagnosis: NEGATIVE

## 2020-12-08 ENCOUNTER — Telehealth: Payer: Managed Care, Other (non HMO) | Admitting: Physician Assistant

## 2020-12-08 DIAGNOSIS — U071 COVID-19: Secondary | ICD-10-CM

## 2020-12-08 MED ORDER — BENZONATATE 100 MG PO CAPS: 100.0000 mg | ORAL_CAPSULE | Freq: Three times a day (TID) | ORAL | 0 refills | Status: DC | PRN

## 2020-12-08 NOTE — Progress Notes (Signed)

## 2020-12-08 NOTE — Progress Notes (Signed)
I have spent 5 minutes in review of e-visit questionnaire, review and updating patient chart, medical decision making and response to patient.   Eliott Amparan Cody Estevon Fluke, PA-C    

## 2020-12-15 ENCOUNTER — Telehealth: Payer: Self-pay | Admitting: Family Medicine

## 2020-12-15 ENCOUNTER — Telehealth: Payer: Self-pay | Admitting: Gastroenterology

## 2020-12-15 NOTE — Telephone Encounter (Signed)
Inbound call from patient requesting a call from  nurse please.  States has been experiencing severe abdominal pain.  Please advise.

## 2020-12-15 NOTE — Telephone Encounter (Signed)
The pt states that she has episodes of severe abd pain. She was last seen in 2020 for elevated LFT and abd pain.  She was thought to have passed a gallstone at the time.  She states she has no pain at this time.  I offered to make her an appt with Doug Sou for 8/2 at 1:30 pm.  She accepted that appt but is aware to if the pain returns she should go to the ED or urgent care. Pt agrees.

## 2020-12-15 NOTE — Telephone Encounter (Signed)
Pt called and said that she got covid last week and five days into it she had a gallbladder attack on Friday morning that lasted for 2 hours and she had another one last night and she wasn't sure what she should do, she said it hasnt happened in a while and which is weierd because she had her gallblader remover a while back but she said that she knows it cant be that but it is doing the same thing as when she did have it. She said it was just strange that it happened twice within 5 days. She said she did try to call her gastro doctor but they said that she hasnt been seen in 2 years and to call her pcp. She requested a call back from the nurse, please advise

## 2020-12-16 NOTE — Telephone Encounter (Signed)
Spoke to patient and she is currently in [place].  She does have an appt with GI on 12/28/20.  Advised that she could call back to schedule a VV with a provider ([facility] office is full this week).  She is in agreement.  Dm/cma

## 2020-12-28 ENCOUNTER — Encounter: Payer: Self-pay | Admitting: Gastroenterology

## 2020-12-28 ENCOUNTER — Ambulatory Visit (INDEPENDENT_AMBULATORY_CARE_PROVIDER_SITE_OTHER): Payer: Managed Care, Other (non HMO) | Admitting: Gastroenterology

## 2020-12-28 VITALS — BP 100/60 | HR 76 | Ht 65.0 in | Wt 131.0 lb

## 2020-12-28 DIAGNOSIS — R1013 Epigastric pain: Secondary | ICD-10-CM | POA: Diagnosis not present

## 2020-12-28 DIAGNOSIS — K8020 Calculus of gallbladder without cholecystitis without obstruction: Secondary | ICD-10-CM

## 2020-12-28 NOTE — Progress Notes (Signed)
Assessment and plan noted.  X-rays and laboratories reviewed.

## 2020-12-28 NOTE — Patient Instructions (Addendum)
It was my pleasure to provide care to you today. Based on our discussion, I am providing you with my recommendations below:  RECOMMENDATION(S):   LABS:   Please proceed to the basement level for lab when you become symptomatic. Press "B" on the elevator. The lab is located at the first door on the left as you exit the elevator.  HEALTHCARE LAWS AND MY CHART RESULTS:   Due to recent changes in healthcare laws, you may see results of your imaging and/or laboratory studies on MyChart before I have had a chance to review them.  I understand that in some cases there may be results that are confusing or concerning to you. Please understand that not all results are received at same time and often I may need to interpret multiple results in order to provide you with the best plan of care or course of treatment. Therefore, I ask that you please give me 29 hours to thoroughly review all your results before contacting my office for clarification.    FOLLOW UP:  Please follow up with me as needed  BMI:  If you are age 29 or younger, your body mass index should be between 19-25. Your There is no height or weight on file to calculate BMI. If this is out of the aformentioned range listed, please consider follow up with your Primary Care Provider.   MY CHART:  The [facility] [facility] providers would like to encourage you to use [facility] to communicate with providers for non-urgent requests or questions.  Due to long hold times on the telephone, sending your provider a message by [facility] may be a faster and more efficient way to get a response.  Please allow 29 business hours for a response.  Please remember that this is for non-urgent requests.   Thank you for trusting me with your gastrointestinal care!    Doug Sou, PA-C

## 2020-12-28 NOTE — Progress Notes (Signed)
12/28/2020 Sierra Hernandez [id number] [birth year]   HISTORY OF PRESENT ILLNESS: This is a 29 year old female who is a patient of Dr. Lamar Sprinkles.  She is known to [place] from 2018 when she had issues with choledocholithiasis and underwent cholecystectomy and ERCP with stone removal and sphincterotomy.  She was then seen by me in July 2020 for an ER follow-up of epigastric abdominal pain with associated elevated LFTs.  At that point we discussed that she was likely having these episodes of intermittently passing gallstones.  She is here today with similar complaints.  Says that she has had 4 episodes over the past year.  She actually had 2 episodes within a week, but it was also at the time that she had COVID so was not sure if that had any association with it.  She just wanted to touch base again.  She says that the pain is always sudden in onset and lasts usually less than 2 hours at a time.  Then she will typically vomit and then feels much better.  She currently has no abdominal pain.   Past Medical History:  Diagnosis Date   Allergy    Asthma    Elevated LFTs    Gallstones    IBS (irritable bowel syndrome)    Pancreatitis 01/2017   Scoliosis    Past Surgical History:  Procedure Laterality Date   CHOLECYSTECTOMY N/A 02/05/2017   Procedure: LAPAROSCOPIC CHOLECYSTECTOMY WITH INTRAOPERATIVE CHOLANGIOGRAM;  Surgeon: Harriette Bouillon, MD;  Location: [facility] OR;  Service: General;  Laterality: N/A;   ERCP N/A 02/06/2017   Procedure: ENDOSCOPIC RETROGRADE CHOLANGIOPANCREATOGRAPHY (ERCP);  Surgeon: Sherrilyn Rist, MD;  Location: [facility];  Service: Gastroenterology;  Laterality: N/A;   MOLE REMOVAL     x 2 on back    reports that she has never smoked. She has never used smokeless tobacco. She reports current alcohol use. She reports that she does not use drugs. family history includes Arthritis in her maternal grandmother and paternal grandmother; Gallstones in her maternal grandfather; Heart  disease in her maternal grandmother; Hyperlipidemia in her father; Melanoma in her mother; Thyroid cancer in her paternal grandfather. Allergies  Allergen Reactions   Augmentin [Amoxicillin-Pot Clavulanate]     Itching ? If related to complications with gallbladder or with augmentin   Minocycline Swelling and Other (See Comments)    Joint pain and swelling, exhaustion      Outpatient Encounter Medications as of 12/28/2020  Medication Sig   AUVI-Q 0.3 MG/0.3ML SOAJ injection Inject into the muscle as directed.   clindamycin-benzoyl peroxide (BENZACLIN) gel Apply 1 application topically every morning.   fexofenadine (ALLEGRA) 60 MG tablet Take 60 mg by mouth daily.   ibuprofen (ADVIL,MOTRIN) 200 MG tablet Take 200 mg by mouth 2 (two) times daily as needed for headache (pain).   magnesium 30 MG tablet Take 30 mg by mouth 2 (two) times daily.   Multiple Vitamin (MULTIVITAMIN WITH MINERALS) TABS tablet Take 1 tablet by mouth daily.   naproxen sodium (ALEVE) 220 MG tablet Take 220-440 mg by mouth 2 (two) times daily as needed (pain/headache).    norgestimate-ethinyl estradiol (ORTHO-CYCLEN) 0.25-35 MG-MCG tablet Take 1 tablet by mouth at bedtime.   polyethylene glycol (MIRALAX / GLYCOLAX) packet Take 17 g by mouth daily as needed (constipation). Mix in 8 oz liquid and drink   spironolactone (ALDACTONE) 50 MG tablet    tretinoin (RETIN-A) 0.025 % cream Apply 1 application topically at bedtime.   Triamcinolone Acetonide (NASACORT ALLERGY  24HR NA) Place into the nose daily.   [DISCONTINUED] benzonatate (TESSALON) 100 MG capsule Take 1 capsule (100 mg total) by mouth 3 (three) times daily as needed for cough.   No facility-administered encounter medications on file as of 12/28/2020.    REVIEW OF SYSTEMS  : All other systems reviewed and negative except where noted in the History of Present Illness.   PHYSICAL EXAM: BP 100/60   Pulse 76   Ht 5\' 5"  (1.651 m)   Wt 131 lb (59.4 kg)   LMP  12/04/2020 (Approximate)   BMI 21.80 kg/m  General: Well developed white female in no acute distress Head: Normocephalic and atraumatic Eyes:  Sclerae anicteric, conjunctiva pink. Ears: Normal auditory acuity Lungs: Clear throughout to auscultation; no W/R/R. Heart: Regular rate and rhythm; no M/R/G. Abdomen: Soft, non-distended.  BS present.  Non-tender. Musculoskeletal: Symmetrical with no gross deformities  Skin: No lesions on visible extremities Extremities: No edema  Neurological: Alert oriented x 4, grossly non-focal Psychological:  Alert and cooperative. Normal mood and affect  ASSESSMENT AND PLAN: *30 year old female who is status post cholecystectomy and previous ERCP for choledocholithiasis in September 2018.  Beginning in 2020 she has been intermittently having episodes of sudden onset epigastric abdominal pain that feel exactly the same as previous gallbladder/gallstone issues.  Even had associated elevation of LFTs during the time of pain.  Suspect that she was intermittently passing gallstones as a cause of her pain and elevated LFTs.  LFTs always returned completely to normal.  She has had 4 episodes over the past year and just wanted to come back and touch base again.  I am going to put orders in to have LFTs checked again.  She will come in to have this done if she develops another episode of pain.  Otherwise we discussed potentially trying ursodiol if it continues to be a recurrent or debilitating issue.  She will follow-up as needed.  CC:  2021, DO

## 2021-04-11 ENCOUNTER — Telehealth: Payer: Self-pay | Admitting: Gastroenterology

## 2021-04-11 ENCOUNTER — Other Ambulatory Visit (INDEPENDENT_AMBULATORY_CARE_PROVIDER_SITE_OTHER): Payer: Managed Care, Other (non HMO)

## 2021-04-11 DIAGNOSIS — R1013 Epigastric pain: Secondary | ICD-10-CM

## 2021-04-11 DIAGNOSIS — K8020 Calculus of gallbladder without cholecystitis without obstruction: Secondary | ICD-10-CM

## 2021-04-11 LAB — HEPATIC FUNCTION PANEL
ALT: 51 U/L — ABNORMAL HIGH (ref 0–35)
AST: 39 U/L — ABNORMAL HIGH (ref 0–37)
Albumin: 4.4 g/dL (ref 3.5–5.2)
Alkaline Phosphatase: 59 U/L (ref 39–117)
Bilirubin, Direct: 0.2 mg/dL (ref 0.0–0.3)
Total Bilirubin: 0.4 mg/dL (ref 0.2–1.2)
Total Protein: 7.4 g/dL (ref 6.0–8.3)

## 2021-04-11 NOTE — Telephone Encounter (Signed)
Patient called stating she was having one of her "fake gallbladder attacks" and asked if she should go get labwork done now or wait until the episode was over.  She said she was OK to drive but didn't want to leave the house unless she had to.  Please call and advise.  Thank you.

## 2021-04-11 NOTE — Telephone Encounter (Signed)
the pt called to report that she is having another "flare"  She was told at her office visit on 12/2020 with Shanda Bumps to come in for labs.  That order has been entered and she will come in now.  See note below.      "I am going to put orders in to have LFTs checked again.  She will come in to have this done if she develops another episode of pain.  Otherwise we discussed potentially trying ursodiol if it continues to be a recurrent or debilitating issue.  She will follow-up as needed."

## 2021-06-01 ENCOUNTER — Telehealth: Payer: Self-pay

## 2021-06-01 NOTE — Telephone Encounter (Signed)
Spoke to patient VIA phone regarding a refill request for her Birth control.  Advised that she will need to establish with a new provider. She is going to look into getting one closer to her home and has 1 more pack of pills left.  She will call today.  Dm/cma

## 2021-06-21 ENCOUNTER — Ambulatory Visit (INDEPENDENT_AMBULATORY_CARE_PROVIDER_SITE_OTHER): Payer: Managed Care, Other (non HMO) | Admitting: Family Medicine

## 2021-06-21 ENCOUNTER — Other Ambulatory Visit: Payer: Self-pay

## 2021-06-21 ENCOUNTER — Encounter: Payer: Self-pay | Admitting: Family Medicine

## 2021-06-21 VITALS — BP 118/60 | HR 79 | Temp 97.5°F | Ht 65.0 in | Wt 134.5 lb

## 2021-06-21 DIAGNOSIS — Z0000 Encounter for general adult medical examination without abnormal findings: Secondary | ICD-10-CM

## 2021-06-21 DIAGNOSIS — Z3041 Encounter for surveillance of contraceptive pills: Secondary | ICD-10-CM | POA: Diagnosis not present

## 2021-06-21 LAB — LIPID PANEL
Cholesterol: 181 mg/dL (ref 0–200)
HDL: 53.6 mg/dL (ref >39.00)
LDL Cholesterol: 104 mg/dL — ABNORMAL HIGH (ref 0–99)
NonHDL: 127.54
Total CHOL/HDL Ratio: 3
Triglycerides: 119 mg/dL (ref 0.0–149.0)
VLDL: 23.8 mg/dL (ref 0.0–40.0)

## 2021-06-21 LAB — COMPREHENSIVE METABOLIC PANEL
ALT: 35 U/L (ref 0–35)
AST: 25 U/L (ref 0–37)
Albumin: 4.3 g/dL (ref 3.5–5.2)
Alkaline Phosphatase: 41 U/L (ref 39–117)
BUN: 14 mg/dL (ref 6–23)
CO2: 29 mEq/L (ref 19–32)
Calcium: 9.8 mg/dL (ref 8.4–10.5)
Chloride: 104 mEq/L (ref 96–112)
Creatinine, Ser: 0.87 mg/dL (ref 0.40–1.20)
GFR: 90.22 mL/min (ref >60.00)
Glucose, Bld: 85 mg/dL (ref 70–99)
Potassium: 4.7 mEq/L (ref 3.5–5.1)
Sodium: 140 mEq/L (ref 135–145)
Total Bilirubin: 0.4 mg/dL (ref 0.2–1.2)
Total Protein: 7.3 g/dL (ref 6.0–8.3)

## 2021-06-21 LAB — CBC
HCT: 40.1 % (ref 36.0–46.0)
Hemoglobin: 13.2 g/dL (ref 12.0–15.0)
MCHC: 32.9 g/dL (ref 30.0–36.0)
MCV: 90.4 fl (ref 78.0–100.0)
Platelets: 279 10*3/uL (ref 150.0–400.0)
RBC: 4.43 Mil/uL (ref 3.87–5.11)
RDW: 12.6 % (ref 11.5–15.5)
WBC: 5.6 10*3/uL (ref 4.0–10.5)

## 2021-06-21 LAB — TSH: TSH: 2.87 u[IU]/mL (ref 0.35–5.50)

## 2021-06-21 MED ORDER — NORGESTIMATE-ETH ESTRADIOL 0.25-35 MG-MCG PO TABS: 1.0000 | ORAL_TABLET | Freq: Every day | ORAL | 3 refills | Status: DC

## 2021-06-21 NOTE — Progress Notes (Signed)
Phone [phone]   Subjective:   Patient is a 31 y.o. female presenting for annual physical.    Chief Complaint  Patient presents with   Establish Care   Annual Exam    Need birth control refills     See problem oriented charting- ROS- full  review of systems was completed and negative except for: needs refills. Sees Allergist/dermatologist/DDS/opt/therapist IBS-mostly ok, but then can be mixed.  Some eyelid eczema for few mo  The following were reviewed and entered/updated in epic: Past Medical History:  Diagnosis Date   Allergy    Asthma    Elevated LFTs    Gallstones    IBS (irritable bowel syndrome)    Pancreatitis 01/2017   Scoliosis    Patient Active Problem List   Diagnosis Date Noted   Epigastric pain 12/20/2018   Intraabdominal fluid collection    Abnormal liver function tests    Protein-calorie malnutrition, severe 02/14/2017   Abdominal distention    Abdominal fluid collection    Acute pancreatitis 02/08/2017   Gallstones    Elevated LFTs    Past Surgical History:  Procedure Laterality Date   CHOLECYSTECTOMY N/A 02/05/2017   Procedure: LAPAROSCOPIC CHOLECYSTECTOMY WITH INTRAOPERATIVE CHOLANGIOGRAM;  Surgeon: Harriette Bouillon, MD;  Location: [facility] OR;  Service: General;  Laterality: N/A;   ERCP N/A 02/06/2017   Procedure: ENDOSCOPIC RETROGRADE CHOLANGIOPANCREATOGRAPHY (ERCP);  Surgeon: Sherrilyn Rist, MD;  Location: [facility] ENDOSCOPY;  Service: Gastroenterology;  Laterality: N/A;   MOLE REMOVAL     x 2 on back    Family History  Problem Relation Age of Onset   Cancer Mother    Melanoma Mother    Hyperlipidemia Father    Heart disease Maternal Grandmother    Arthritis Maternal Grandmother    Gallstones Maternal Grandfather    Arthritis Paternal Grandmother    Thyroid cancer Paternal Grandfather    Colon cancer Neg Hx    Stomach cancer Neg Hx    Pancreatic cancer Neg Hx    Liver disease Neg Hx     Medications- reviewed and updated Current  Outpatient Medications  Medication Sig Dispense Refill   AUVI-Q 0.3 MG/0.3ML SOAJ injection Inject into the muscle as directed.     clindamycin-benzoyl peroxide (BENZACLIN) gel Apply 1 application topically every morning.     fexofenadine (ALLEGRA) 60 MG tablet Take 60 mg by mouth daily.     ibuprofen (ADVIL,MOTRIN) 200 MG tablet Take 200 mg by mouth 2 (two) times daily as needed for headache (pain).     Multiple Vitamin (MULTIVITAMIN WITH MINERALS) TABS tablet Take 1 tablet by mouth daily.     naproxen sodium (ALEVE) 220 MG tablet Take 220-440 mg by mouth 2 (two) times daily as needed (pain/headache).      polyethylene glycol (MIRALAX / GLYCOLAX) packet Take 17 g by mouth daily as needed (constipation). Mix in 8 oz liquid and drink     tacrolimus (PROTOPIC) 0.1 % ointment SMARTSIG:1 Topical Daily     tretinoin (RETIN-A) 0.025 % cream Apply 1 application topically at bedtime.     Triamcinolone Acetonide (NASACORT ALLERGY 24HR NA) Place into the nose daily.     norgestimate-ethinyl estradiol (ORTHO-CYCLEN) 0.25-35 MG-MCG tablet Take 1 tablet by mouth at bedtime. 84 tablet 3   No current facility-administered medications for this visit.    Allergies-reviewed and updated Allergies  Allergen Reactions   Amoxicillin-Pot Clavulanate Itching    Itching ? If related to complications with gallbladder or with augmentin Itching ? If  related to complications with gallbladder or with augmentin   Minocycline Swelling and Other (See Comments)    Joint pain and swelling, exhaustion Joint pain and swelling, exhaustion    Social History   Social History Narrative   [employer]er[employer]   Objective  Objective:  BP 118/60    Pulse 79    Temp (!) 97.5 F (36.4 C) (Temporal)    Ht 5\' 5"  (1.651 m)    Wt 134 lb 8 oz (61 kg)    LMP 06/18/2021 (Exact Date)    SpO2 98%    BMI 22.38 kg/m  Gen: WDWN NAD wf HEENT: NCAT, conjunctiva not injected, sclera nonicteric TM WNL B, OP moist, no exudates  NECK:   supple, no thyromegaly, no nodes, no carotid bruits CARDIAC: RRR, S1S2+, no murmur. DP 2+B LUNGS: CTAB. No wheezes ABDOMEN:  BS+, soft, NTND, No HSM, no masses EXT:  no edema MSK: no gross abnormalities.  NEURO: A&O x3.  CN II-XII intact.  PSYCH: normal mood. Good eye contact Skin-few pink bumps lateral eyes B     Assessment and Plan   Health Maintenance counseling: 1. Anticipatory guidance: Patient counseled regarding regular dental exams q6 months, eye exams,  avoiding smoking and second hand smoke, limiting alcohol to 1 beverage per day, no illicit drugs.   2. Risk factor reduction:  Advised patient of need for regular exercise and diet rich and fruits and vegetables to reduce risk of heart attack and stroke. Exercise- pilates, swim, gym.  Wt Readings from Last 3 Encounters:  06/21/21 134 lb 8 oz (61 kg)  12/28/20 131 lb (59.4 kg)  07/14/20 134 lb 3.2 oz (60.9 kg)   3. Immunizations/screenings/ancillary studies Immunization History  Administered Date(s) Administered   HPV 9-valent 02/25/2007, 04/24/2007, 10/12/2007   Influenza Split 03/01/2009   Influenza, Quadrivalent, Recombinant, Inj, Pf 03/12/2020   Influenza, Seasonal, Injecte, Preservative Fre 03/08/2017, 03/15/2019   Influenza,inj,Quad PF,6+ Mos 04/01/2021   Influenza-Unspecified 03/26/2020   Meningococcal Conjugate 10/24/2005, 11/23/2010   Moderna Covid-19 Vaccine Bivalent Booster 1281yrs & up 04/01/2021   PFIZER(Purple Top)SARS-COV-2 Vaccination 08/23/2019, 09/16/2019, 04/24/2020   Tdap 02/25/2007, 01/15/2017   There are no preventive care reminders to display for this patient.   4. Cervical cancer screening: UTD-done 07/14/20-next 3 yrs 5. Skin cancer screening- advised regular sunscreen use. Denies worrisome, changing, or new skin lesions.  6. Birth control/STD check: cont ocp.  Just broke up w/partner 7. Smoking associated screening: non smoker 8. Alcohol screening: occ  Problem List Items Addressed This Visit    None Visit Diagnoses     Wellness examination    -  Primary   Relevant Orders   CBC   Comprehensive metabolic panel   Lipid panel   TSH   Encounter for birth control pills maintenance       Relevant Medications   norgestimate-ethinyl estradiol (ORTHO-CYCLEN) 0.25-35 MG-MCG tablet       Recommended follow up: 12mReturn in about 1 year (around 06/21/2022) for annual. No future appointments.  Lab/Order associations:non fasting   ICD-10-CM   1. Wellness examination  Z00.00 CBC    Comprehensive metabolic panel    Lipid panel    TSH    2. Encounter for birth control pills maintenance  Z30.41 norgestimate-ethinyl estradiol (ORTHO-CYCLEN) 0.25-35 MG-MCG tablet      Meds ordered this encounter  Medications   norgestimate-ethinyl estradiol (ORTHO-CYCLEN) 0.25-35 MG-MCG tablet    Sig: Take 1 tablet by mouth at bedtime.    Dispense:  84 tablet  Refill:  3     Angelena Sole, MD

## 2021-06-21 NOTE — Patient Instructions (Signed)
It was very nice to see you today!  See you next year   PLEASE NOTE:  If you had any lab tests please let us know if you have not heard back within a few days. You may see your results on MyChart before we have a chance to review them but we will give you a call once they are reviewed by us. If we ordered any referrals today, please let us know if you have not heard from their office within the next week.   Please try these tips to maintain a healthy lifestyle:  Eat most of your calories during the day when you are active. Eliminate processed foods including packaged sweets (pies, cakes, cookies), reduce intake of potatoes, white bread, white pasta, and white rice. Look for whole grain options, oat flour or almond flour.  Each meal should contain half fruits/vegetables, one quarter protein, and one quarter carbs (no bigger than a computer mouse).  Cut down on sweet beverages. This includes juice, soda, and sweet tea. Also watch fruit intake, though this is a healthier sweet option, it still contains natural sugar! Limit to 3 servings daily.  Drink at least 1 glass of water with each meal and aim for at least 8 glasses per day  Exercise at least 150 minutes every week.   

## 2021-07-06 ENCOUNTER — Emergency Department: Admit: 2021-07-06 | Discharge status: Absent without leave | Payer: Self-pay

## 2021-07-06 ENCOUNTER — Other Ambulatory Visit: Payer: Self-pay

## 2021-07-06 ENCOUNTER — Emergency Department (INDEPENDENT_AMBULATORY_CARE_PROVIDER_SITE_OTHER)
Admission: EM | Admit: 2021-07-06 | Discharge: 2021-07-06 | Disposition: A | Discharge status: Home / Self Care | Payer: Managed Care, Other (non HMO) | Source: Home / Self Care | Attending: Family Medicine | Admitting: Family Medicine

## 2021-07-06 DIAGNOSIS — R1310 Dysphagia, unspecified: Secondary | ICD-10-CM

## 2021-07-06 NOTE — ED Triage Notes (Signed)
Pt c/o possible allergic rxn that occurred earlier today Says there was a moment where she felt it was hard to swallow and panicked thinking she was having an allerigc rxn. Takes allergy shots bi weekly. Last one was last week. Has epi pen to use as needed.

## 2021-07-06 NOTE — Discharge Instructions (Signed)
Return as needed

## 2021-07-06 NOTE — ED Provider Notes (Signed)
[name]    CSN: [id number] Arrival date & time: 07/06/21  1814      History   Chief Complaint Chief Complaint  Patient presents with   Allergic Reaction    possible    HPI Sierra Hernandez is a 31 y.o. female.   HPI  Patient has allergies.  She gets shots every other week.  Her shot was last week.  She states that she has only had 1 reaction to a shot that required an epi treatment.  She carries an EpiPen. Today while at work she felt well.  Was sitting in her desk.  Drinking coffee.  With one of her swallows of coffee she felt her throat was "funny".  Slightly swollen.  She sat for a moment decided she was okay did finish her coffee.  She has been eating and drinking normally.  Breathing normally.  No new food or medicine.  Took her usual allergy medicines this morning.  Has never had an ER visit for an allergic reaction.  Cannot imagine what she was allergic to.  Past Medical History:  Diagnosis Date   Allergy    Asthma    Elevated LFTs    Gallstones    IBS (irritable bowel syndrome)    Pancreatitis 01/2017   Scoliosis     Patient Active Problem List   Diagnosis Date Noted   Epigastric pain 12/20/2018   Intraabdominal fluid collection    Abnormal liver function tests    Protein-calorie malnutrition, severe 02/14/2017   Abdominal distention    Abdominal fluid collection    Acute pancreatitis 02/08/2017   Gallstones    Elevated LFTs     Past Surgical History:  Procedure Laterality Date   CHOLECYSTECTOMY N/A 02/05/2017   Procedure: LAPAROSCOPIC CHOLECYSTECTOMY WITH INTRAOPERATIVE CHOLANGIOGRAM;  Surgeon: Erroll Luna, MD;  Location: [place];  Service: General;  Laterality: N/A;   ERCP N/A 02/06/2017   Procedure: ENDOSCOPIC RETROGRADE CHOLANGIOPANCREATOGRAPHY (ERCP);  Surgeon: Doran Stabler, MD;  Location: [place];  Service: Gastroenterology;  Laterality: N/A;   MOLE REMOVAL     x 2 on back    OB History   No obstetric history on  file.      Home Medications    Prior to Admission medications   Medication Sig Start Date End Date Taking? Authorizing Provider  AUVI-Q 0.3 MG/0.3ML SOAJ injection Inject into the muscle as directed. 07/26/20   [provider]  clindamycin-benzoyl peroxide (BENZACLIN) gel Apply 1 application topically every morning. 06/10/20   [provider]  fexofenadine (ALLEGRA) 60 MG tablet Take 60 mg by mouth daily.    [provider]  ibuprofen (ADVIL,MOTRIN) 200 MG tablet Take 200 mg by mouth 2 (two) times daily as needed for headache (pain).    [provider]  Multiple Vitamin (MULTIVITAMIN WITH MINERALS) TABS tablet Take 1 tablet by mouth daily.    [provider]  naproxen sodium (ALEVE) 220 MG tablet Take 220-440 mg by mouth 2 (two) times daily as needed (pain/headache).     [provider]  norgestimate-ethinyl estradiol (ORTHO-CYCLEN) 0.25-35 MG-MCG tablet Take 1 tablet by mouth at bedtime. 06/21/21   Tawnya Crook, MD  polyethylene glycol [facility] / Floria Raveling) packet Take 17 g by mouth daily as needed (constipation). Mix in 8 oz liquid and drink    [provider]  tacrolimus (PROTOPIC) 0.1 % ointment SMARTSIG:1 Topical Daily 05/20/21   [provider]  tretinoin (RETIN-A) 0.025 % cream Apply 1 application  topically at bedtime. 06/14/20   [provider]  Triamcinolone Acetonide (NASACORT ALLERGY 24HR NA) Place into the nose daily.    [provider]    Family History Family History  Problem Relation Age of Onset   Cancer Mother    Melanoma Mother    Hyperlipidemia Father    Heart disease Maternal Grandmother    Arthritis Maternal Grandmother    Gallstones Maternal Grandfather    Arthritis Paternal Grandmother    Thyroid cancer Paternal Grandfather    Colon cancer Neg Hx    Stomach cancer Neg Hx    Pancreatic cancer Neg Hx    Liver disease Neg Hx     Social History Social History   Tobacco  Use   Smoking status: Never   Smokeless tobacco: Never  Vaping Use   Vaping Use: Never used  Substance Use Topics   Alcohol use: Yes    Comment: ~2x per month   Drug use: No     Allergies   Amoxicillin-pot clavulanate and Minocycline   Review of Systems Review of Systems See HPI  Physical Exam Triage Vital Signs ED Triage Vitals  Enc Vitals Group     BP 07/06/21 1833 130/78     Pulse Rate 07/06/21 1833 69     Resp 07/06/21 1833 18     Temp 07/06/21 1833 98.6 F (37 C)     Temp Source 07/06/21 1833 Oral     SpO2 07/06/21 1833 100 %     Weight --      Height --      Head Circumference --      Peak Flow --      Pain Score 07/06/21 1834 0     Pain Loc --      Pain Edu? --      Excl. in [place]? --    No data found.  Updated Vital Signs BP 130/78 (BP Location: Right Arm)    Pulse 69    Temp 98.6 F (37 C) (Oral)    Resp 18    LMP 06/18/2021 (Exact Date)    SpO2 100%   Physical Exam Constitutional:      General: She is not in acute distress.    Appearance: She is well-developed.  HENT:     Head: Normocephalic and atraumatic.     Right Ear: Tympanic membrane and ear canal normal.     Left Ear: Tympanic membrane and ear canal normal.     Nose: Nose normal. No rhinorrhea.     Mouth/Throat:     Mouth: Mucous membranes are moist.     Pharynx: No posterior oropharyngeal erythema.  Eyes:     Conjunctiva/sclera: Conjunctivae normal.     Pupils: Pupils are equal, round, and reactive to light.  Cardiovascular:     Rate and Rhythm: Normal rate and regular rhythm.     Heart sounds: Normal heart sounds.  Pulmonary:     Effort: Pulmonary effort is normal. No respiratory distress.     Breath sounds: Normal breath sounds.  Abdominal:     General: There is no distension.     Palpations: Abdomen is soft.  Musculoskeletal:        General: Normal range of motion.     Cervical back: Normal range of motion.  Lymphadenopathy:     Cervical: Cervical adenopathy present.   Skin:    General: Skin is warm and dry.  Neurological:     Mental Status: She is alert.  UC Treatments / Results  Labs (all labs ordered are listed, but only abnormal results are displayed) Labs Reviewed - No data to display  EKG   Radiology No results found.  Procedures Procedures (including critical care time)  Medications Ordered in UC Medications - No data to display  Initial Impression / Assessment and Plan / UC Course  I have reviewed the triage vital signs and the nursing notes.  Pertinent labs & imaging results that were available during my care of the patient were reviewed by me and considered in my medical decision making (see chart for details).     I explained to the patient that is very difficult to tell what someone may have reacted to hours later when they are having no symptoms whatsoever.  I told her that anxiety was certainly a factor.  I do not see any evidence of swelling in her throat or illness.  It could be from postnasal drip or her allergies.  It could be something that she ate.  She could be coming down with an infection.  At this point she is to go home and try to relax, return as needed Final Clinical Impressions(s) / UC Diagnoses   Final diagnoses:  Dysphagia, unspecified type     Discharge Instructions      Return as needed    ED Prescriptions   None    PDMP not reviewed this encounter.   Raylene Everts, MD 07/06/21 [phone]

## 2021-09-14 ENCOUNTER — Telehealth: Payer: Self-pay | Admitting: Family Medicine

## 2021-09-14 ENCOUNTER — Ambulatory Visit (INDEPENDENT_AMBULATORY_CARE_PROVIDER_SITE_OTHER): Payer: Managed Care, Other (non HMO) | Admitting: Family Medicine

## 2021-09-14 ENCOUNTER — Encounter: Payer: Self-pay | Admitting: Family Medicine

## 2021-09-14 VITALS — BP 110/70 | HR 75 | Temp 98.3°F | Ht 64.0 in | Wt 133.5 lb

## 2021-09-14 DIAGNOSIS — M542 Cervicalgia: Secondary | ICD-10-CM | POA: Diagnosis not present

## 2021-09-14 DIAGNOSIS — N644 Mastodynia: Secondary | ICD-10-CM

## 2021-09-14 DIAGNOSIS — R0789 Other chest pain: Secondary | ICD-10-CM

## 2021-09-14 NOTE — Patient Instructions (Signed)
ER if severe/won't resolve ? ?Take Aleve twice daily ? ?[phone] for mammogram -for diagnostic ?

## 2021-09-14 NOTE — Telephone Encounter (Signed)
Pt stated she went to urgent care this morning for a tightness in the left side of the chest. They told her it was muscular issue and to follow up with pcp. She has asked to come in today. She is scheduled this afternoon. She stated it is not a pain but more of an ache.  ?

## 2021-09-14 NOTE — Telephone Encounter (Signed)
FYI

## 2021-09-14 NOTE — Progress Notes (Signed)
? ?Subjective:  ? ? ? Patient ID: Sierra Hernandez, female    DOB: [birth year], 31 y.o.   MRN: [id number] ? ?Chief Complaint  ?Patient presents with  ? Follow-up  ?  UC follow-up for muscle tightness in chest ?Went to UC on today for chest pain was told to follow-up with PCP  ? ? ?HPI ?Past few wks.  Chest not feeling "right" on L side.  Started while walking in park in [place] "like asthma".  Tight L chest and under L arm.  Tightness/pain/pressure medial side of upper L breast for few seconds. Can be more lateral side.   Was sitting at the time when occurred last pm.  Smart watch-no inc HR.  ?Saturday night from neck into upper shoulders-achey.  Occ pain near L shoulder blade. .  Today, L upper arm feels "warm".    No cough/sob/sweats/nausea/HA/dizzy/abd pain/heartburn. Did go to Urgent Care today-did EKG.  Told probably muscular and f/u here.  Pt concerned ?No major FH CAD ?Occ ETOH ?LMP 4/15.  Reg. On time on OCP ? ?In [place] 4/1-4/10-but pain prior to that.  ? ?There are no preventive care reminders to display for this patient. ? ?Past Medical History:  ?Diagnosis Date  ? Allergy   ? Asthma   ? Elevated LFTs   ? Gallstones   ? IBS (irritable bowel syndrome)   ? Pancreatitis 01/2017  ? Scoliosis   ? ? ?Past Surgical History:  ?Procedure Laterality Date  ? CHOLECYSTECTOMY N/A 02/05/2017  ? Procedure: LAPAROSCOPIC CHOLECYSTECTOMY WITH INTRAOPERATIVE CHOLANGIOGRAM;  Surgeon: Harriette Bouillon, MD;  Location: [facility] OR;  Service: General;  Laterality: N/A;  ? ERCP N/A 02/06/2017  ? Procedure: ENDOSCOPIC RETROGRADE CHOLANGIOPANCREATOGRAPHY (ERCP);  Surgeon: Sherrilyn Rist, MD;  Location: [facility] ENDOSCOPY;  Service: Gastroenterology;  Laterality: N/A;  ? MOLE REMOVAL    ? x 2 on back  ? ? ?Outpatient Medications Prior to Visit  ?Medication Sig Dispense Refill  ? AUVI-Q 0.3 MG/0.3ML SOAJ injection Inject into the muscle as directed.    ? fexofenadine (ALLEGRA) 60 MG tablet Take 60 mg by mouth daily.    ? ibuprofen  (ADVIL,MOTRIN) 200 MG tablet Take 200 mg by mouth 2 (two) times daily as needed for headache (pain).    ? Multiple Vitamin (MULTIVITAMIN WITH MINERALS) TABS tablet Take 1 tablet by mouth daily.    ? naproxen sodium (ALEVE) 220 MG tablet Take 220-440 mg by mouth 2 (two) times daily as needed (pain/headache).     ? norgestimate-ethinyl estradiol (ORTHO-CYCLEN) 0.25-35 MG-MCG tablet Take 1 tablet by mouth at bedtime. 84 tablet 3  ? polyethylene glycol (MIRALAX / GLYCOLAX) packet Take 17 g by mouth daily as needed (constipation). Mix in 8 oz liquid and drink    ? tacrolimus (PROTOPIC) 0.1 % ointment SMARTSIG:1 Topical Daily    ? tretinoin (RETIN-A) 0.025 % cream Apply 1 application topically at bedtime.    ? Triamcinolone Acetonide (NASACORT ALLERGY 24HR NA) Place into the nose daily.    ? clindamycin-benzoyl peroxide (BENZACLIN) gel Apply 1 application topically every morning.    ? ?No facility-administered medications prior to visit.  ? ? ?Allergies  ?Allergen Reactions  ? Amoxicillin-Pot Clavulanate Itching  ?  Itching ? If related to complications with gallbladder or with augmentin ?Itching ? If related to complications with gallbladder or with augmentin ?Itching ? If related to complications with gallbladder or with augmentin ?Itching ? If related to complications with gallbladder or with augmentin ?Itching ? If related to complications  with gallbladder or with augmentin  ? Minocycline Swelling and Other (See Comments)  ?  Joint pain and swelling, exhaustion ?Joint pain and swelling, exhaustion ?Other reaction(s): Other ?Joint pain and swelling, exhaustion ?Joint pain and swelling, exhaustion ?Joint pain and swelling, exhaustion  ? ?ROS neg/noncontributory except as noted HPI/below ? ? ?   ?Objective:  ?  ? ?BP 110/70   Pulse 75   Temp 98.3 ?F (36.8 ?C) (Temporal)   Ht 5\' 4"  (1.626 m)   Wt 133 lb 8 oz (60.6 kg)   LMP 09/10/2021 (Exact Date)   SpO2 98%   BMI 22.92 kg/m?  ?Wt Readings from Last 3 Encounters:   ?09/14/21 133 lb 8 oz (60.6 kg)  ?06/21/21 134 lb 8 oz (61 kg)  ?12/28/20 131 lb (59.4 kg)  ? ? ?Physical Exam  ? ?Gen: WDWN NAD WF ?HEENT: NCAT, conjunctiva not injected, sclera nonicteric ?NECK:  supple, no thyromegaly, no nodes, no carotid bruits.  ?CARDIAC: RRR, S1S2+, no murmur. DP 2+B  ?LUNGS: CTAB. No wheezes ?ABDOMEN:  BS+, soft, NTND, No HSM, no masses ?EXT:  no edema ?MSK: no gross abnormalities.  ?NEURO: A&O x3.  CN II-XII intact.  ?PSYCH: normal mood. Good eye contact.  Tearful at times-fear ?Neck: neg spurling.  No TTP entire spine.  Good rom arms.  ?Mild chest wall tenderness over lateral upper quad L breast ara. ?Axilla-no nodes.  ? ?Breast exam-chaperone QJ present.  Some mild fullness and tenderness L upper outer quad.  L slightly larger and more full than R.  No nipple d/c.  No specific lump.  ? ?   ?Assessment & Plan:  ? ?Problem List Items Addressed This Visit   ?None ?Visit Diagnoses   ? ? Other chest pain    -  Primary  ? Relevant Orders  ? DG Chest 2 View  ? Amylase  ? CBC with Differential/Platelet  ? Lipase  ? Comprehensive metabolic panel  ? C-reactive protein  ? Neck pain      ? Relevant Orders  ? DG Cervical Spine 2 or 3 views  ? Breast pain      ? Relevant Orders  ? MM Digital Diagnostic Bilat  ? 02/27/21 Unlisted Procedure Breast  ? ?  ?1.  Left chest pain-no family history coronary artery disease.  It started with ambulation, but now can occur with just sitting.  Question cardiac (highly doubt)-Will refer to cardiology-patient very concerned.  If worse, does not resolve, other symptoms-Go to the emergency room.  May be chest wall pain-check chest x-ray.  May be coming from cervical spine-check C-spine x-ray.  Will also check CBC CMP amylase lipase C-reactive protein.  Although there is no pain to palpation in the abdomen, it may be GI related.  She does have a history of gallstone pancreatitis. ?2.  Neck pain-possible pinched nerve.  We will check a C-spine.  Take Aleve twice daily for at  least 1 week. ?3.  Left breast pain/fullness/larger than right-we will check mammogram/ultrasound. ? ?No orders of the defined types were placed in this encounter. ? ? ?[place], MD ? ?

## 2021-09-15 LAB — CBC WITH DIFFERENTIAL/PLATELET
Basophils Absolute: 0.1 10*3/uL (ref 0.0–0.1)
Basophils Relative: 0.8 % (ref 0.0–3.0)
Eosinophils Absolute: 0.1 10*3/uL (ref 0.0–0.7)
Eosinophils Relative: 1.2 % (ref 0.0–5.0)
HCT: 41 % (ref 36.0–46.0)
Hemoglobin: 13.7 g/dL (ref 12.0–15.0)
Lymphocytes Relative: 29.9 % (ref 12.0–46.0)
Lymphs Abs: 1.8 10*3/uL (ref 0.7–4.0)
MCHC: 33.4 g/dL (ref 30.0–36.0)
MCV: 89.9 fl (ref 78.0–100.0)
Monocytes Absolute: 0.5 10*3/uL (ref 0.1–1.0)
Monocytes Relative: 7.9 % (ref 3.0–12.0)
Neutro Abs: 3.7 10*3/uL (ref 1.4–7.7)
Neutrophils Relative %: 60.2 % (ref 43.0–77.0)
Platelets: 285 10*3/uL (ref 150.0–400.0)
RBC: 4.56 Mil/uL (ref 3.87–5.11)
RDW: 12.6 % (ref 11.5–15.5)
WBC: 6.1 10*3/uL (ref 4.0–10.5)

## 2021-09-16 ENCOUNTER — Ambulatory Visit (INDEPENDENT_AMBULATORY_CARE_PROVIDER_SITE_OTHER)
Admission: RE | Admit: 2021-09-16 | Discharge: 2021-09-16 | Disposition: A | Discharge status: Home / Self Care | Payer: Managed Care, Other (non HMO) | Source: Ambulatory Visit | Attending: Family Medicine | Admitting: Family Medicine

## 2021-09-16 DIAGNOSIS — M542 Cervicalgia: Secondary | ICD-10-CM

## 2021-09-16 DIAGNOSIS — R0789 Other chest pain: Secondary | ICD-10-CM | POA: Diagnosis not present

## 2021-09-20 ENCOUNTER — Other Ambulatory Visit: Payer: Self-pay

## 2021-09-20 ENCOUNTER — Other Ambulatory Visit (INDEPENDENT_AMBULATORY_CARE_PROVIDER_SITE_OTHER): Payer: Managed Care, Other (non HMO)

## 2021-09-20 DIAGNOSIS — R0789 Other chest pain: Secondary | ICD-10-CM | POA: Diagnosis not present

## 2021-09-20 LAB — COMPREHENSIVE METABOLIC PANEL
ALT: 30 U/L (ref 0–35)
AST: 17 U/L (ref 0–37)
Albumin: 4.4 g/dL (ref 3.5–5.2)
Alkaline Phosphatase: 48 U/L (ref 39–117)
BUN: 17 mg/dL (ref 6–23)
CO2: 27 mEq/L (ref 19–32)
Calcium: 9.2 mg/dL (ref 8.4–10.5)
Chloride: 103 mEq/L (ref 96–112)
Creatinine, Ser: 0.8 mg/dL (ref 0.40–1.20)
GFR: 99.6 mL/min (ref >60.00)
Glucose, Bld: 99 mg/dL (ref 70–99)
Potassium: 4 mEq/L (ref 3.5–5.1)
Sodium: 139 mEq/L (ref 135–145)
Total Bilirubin: 0.3 mg/dL (ref 0.2–1.2)
Total Protein: 7.1 g/dL (ref 6.0–8.3)

## 2021-09-20 LAB — C-REACTIVE PROTEIN: CRP: <1 mg/dL (ref 0.5–20.0)

## 2021-09-20 LAB — AMYLASE: Amylase: 56 U/L (ref 27–131)

## 2021-09-20 LAB — LIPASE: Lipase: 43 U/L (ref 11.0–59.0)

## 2021-09-27 ENCOUNTER — Ambulatory Visit
Admission: RE | Admit: 2021-09-27 | Discharge: 2021-09-27 | Disposition: A | Discharge status: Home / Self Care | Payer: Managed Care, Other (non HMO) | Source: Ambulatory Visit | Attending: Family Medicine | Admitting: Family Medicine

## 2021-09-27 DIAGNOSIS — N644 Mastodynia: Secondary | ICD-10-CM

## 2021-11-23 ENCOUNTER — Telehealth: Payer: Self-pay | Admitting: Family Medicine

## 2021-11-23 ENCOUNTER — Other Ambulatory Visit: Payer: Self-pay | Admitting: *Deleted

## 2021-11-23 DIAGNOSIS — Z3041 Encounter for surveillance of contraceptive pills: Secondary | ICD-10-CM

## 2021-11-23 MED ORDER — NORGESTIMATE-ETH ESTRADIOL 0.25-35 MG-MCG PO TABS: 1.0000 | ORAL_TABLET | Freq: Every day | ORAL | 0 refills | Status: DC

## 2021-11-23 NOTE — Telephone Encounter (Signed)
Rx sent to the pharmacy.

## 2021-11-23 NOTE — Telephone Encounter (Signed)
Pt states she forgot her birth control and is on vacation. She is asking a single order can be placed for her parents to pick up and bring to her. Please advise  MEDICATION:norgestimate-ethinyl estradiol (ORTHO-CYCLEN) 0.25-35 MG-MCG tablet  PHARMACY: [facility] [phone] [address] [place], [place] [address]

## 2021-12-30 ENCOUNTER — Telehealth: Payer: Self-pay | Admitting: Family Medicine

## 2021-12-30 NOTE — Telephone Encounter (Signed)
Spoke to patient and she stated that Sunday or Monday pain under left breast returned and radiating to neck. Patient stated that she did work out right before the pain started again. Similar to the muscle pain she had before. Denies any SOB. Patient will keep appointment for Tuesday and will go to ER or  UC with any worsening sx over the weekend.

## 2021-12-30 NOTE — Telephone Encounter (Signed)
Pt states she has the muscular tightness that she had before and was seen in 4/23. We scheduled an appt for her to come in next week. But she would like to talk to cma to see if this is necessary. Please advise

## 2022-01-03 ENCOUNTER — Ambulatory Visit (INDEPENDENT_AMBULATORY_CARE_PROVIDER_SITE_OTHER): Payer: Managed Care, Other (non HMO) | Admitting: Family Medicine

## 2022-01-03 ENCOUNTER — Encounter: Payer: Self-pay | Admitting: Family Medicine

## 2022-01-03 VITALS — BP 116/62 | HR 66 | Temp 98.3°F | Ht 64.0 in | Wt 135.0 lb

## 2022-01-03 DIAGNOSIS — R202 Paresthesia of skin: Secondary | ICD-10-CM | POA: Diagnosis not present

## 2022-01-03 DIAGNOSIS — M2550 Pain in unspecified joint: Secondary | ICD-10-CM | POA: Diagnosis not present

## 2022-01-03 DIAGNOSIS — R0789 Other chest pain: Secondary | ICD-10-CM | POA: Diagnosis not present

## 2022-01-03 LAB — VITAMIN B12: Vitamin B-12: 374 pg/mL (ref 211–911)

## 2022-01-03 NOTE — Progress Notes (Signed)
Subjective:     Patient ID: Sierra Hernandez, female    DOB: 11-20-1990, 31 y.o.   MRN: [id number]  Chief Complaint  Patient presents with   Pain    Chest tightness/muscle tightness that started last week, happened previously in March Deep pain under left breast    HPI Had been on vacation 3 wks and then restarted exercise routine CP for 1 wk.  across chest-tight and into B upper arms.  Some in neck as well.  Just sitting on couch.  L side deep in chest for few seconds.  Occ across upper abd-"cramping".  Past few days, achey knuckles fingers or toes.  No swelling. If sits for too long, calves/ inner thighs-some "pins and needles".  Took ibu last wk.  Better this week.    The "sensations" in upper arms and legs-more when sitting, less when walking.  Family friend had CP and had stent in heart.   H/o swollen joints in past-and etiol minocin in HS.  Resolved.  No swollen joints now, but pain at times.   There are no preventive care reminders to display for this patient.  Past Medical History:  Diagnosis Date   Allergy    Asthma    Elevated LFTs    Gallstones    IBS (irritable bowel syndrome)    Pancreatitis 01/2017   Scoliosis     Past Surgical History:  Procedure Laterality Date   CHOLECYSTECTOMY N/A 02/05/2017   Procedure: LAPAROSCOPIC CHOLECYSTECTOMY WITH INTRAOPERATIVE CHOLANGIOGRAM;  Surgeon: Harriette Bouillon, MD;  Location: [facility] OR;  Service: General;  Laterality: N/A;   ERCP N/A 02/06/2017   Procedure: ENDOSCOPIC RETROGRADE CHOLANGIOPANCREATOGRAPHY (ERCP);  Surgeon: Sherrilyn Rist, MD;  Location: [facility] ENDOSCOPY;  Service: Gastroenterology;  Laterality: N/A;   MOLE REMOVAL     x 2 on back    Outpatient Medications Prior to Visit  Medication Sig Dispense Refill   AUVI-Q 0.3 MG/0.3ML SOAJ injection Inject into the muscle as directed.     fexofenadine (ALLEGRA) 60 MG tablet Take 60 mg by mouth daily.     ibuprofen (ADVIL,MOTRIN) 200 MG tablet Take 200 mg by mouth 2 (two)  times daily as needed for headache (pain).     Multiple Vitamin (MULTIVITAMIN WITH MINERALS) TABS tablet Take 1 tablet by mouth daily.     naproxen sodium (ALEVE) 220 MG tablet Take 220-440 mg by mouth 2 (two) times daily as needed (pain/headache).      norgestimate-ethinyl estradiol (ORTHO-CYCLEN) 0.25-35 MG-MCG tablet Take 1 tablet by mouth at bedtime. 84 tablet 0   polyethylene glycol (MIRALAX / GLYCOLAX) packet Take 17 g by mouth daily as needed (constipation). Mix in 8 oz liquid and drink     tacrolimus (PROTOPIC) 0.1 % ointment SMARTSIG:1 Topical Daily     tretinoin (RETIN-A) 0.025 % cream Apply 1 application topically at bedtime.     Triamcinolone Acetonide (NASACORT ALLERGY 24HR NA) Place into the nose daily.     No facility-administered medications prior to visit.    Allergies  Allergen Reactions   Amoxicillin-Pot Clavulanate Itching    Itching ? If related to complications with gallbladder or with augmentin Itching ? If related to complications with gallbladder or with augmentin Itching ? If related to complications with gallbladder or with augmentin Itching ? If related to complications with gallbladder or with augmentin Itching ? If related to complications with gallbladder or with augmentin   Minocycline Swelling and Other (See Comments)    Joint pain and swelling, exhaustion  Joint pain and swelling, exhaustion Other reaction(s): Other Joint pain and swelling, exhaustion Joint pain and swelling, exhaustion Joint pain and swelling, exhaustion   ROS neg/noncontributory except as noted HPI/below      Objective:     BP 116/62   Pulse 66   Temp 98.3 F (36.8 C) (Temporal)   Ht 5\' 4"  (1.626 m)   Wt 135 lb (61.2 kg)   LMP 12/31/2021 (Exact Date)   SpO2 98%   BMI 23.17 kg/m  Wt Readings from Last 3 Encounters:  01/03/22 135 lb (61.2 kg)  09/14/21 133 lb 8 oz (60.6 kg)  06/21/21 134 lb 8 oz (61 kg)    Physical Exam   Gen: WDWN NAD HEENT: NCAT, conjunctiva not  injected, sclera nonicteric NECK:  supple, no thyromegaly, no nodes, no carotid bruits CARDIAC: RRR, S1S2+, no murmur. DP 2+B LUNGS: CTAB. No wheezes EXT:  no edema MSK: no gross abnormalities.  MS 5/5 all 4 NEURO: A&O x3.  CN II-XII intact.  PSYCH: normal mood. Good eye contact     Assessment & Plan:   Problem List Items Addressed This Visit   None Visit Diagnoses     Paresthesia    -  Primary   Relevant Orders   Ambulatory referral to Sports Medicine   Rheumatoid factor   Vitamin B12   Other chest pain       Relevant Orders   Ambulatory referral to Sports Medicine   Arthralgia, unspecified joint       Relevant Orders   Ambulatory referral to Sports Medicine   Rheumatoid factor   Vitamin B12     1  paresthesia-? Pinched nerve-but all 4 limbs-upper areas.  ?anxiety, ? Other.  Labs in past-crp, etc unremarkable.  Will check RF and B12.  Refer Sports med 2.  Other Chest pain-not cardiac.  Pt wants to hold on calc score.  Suspect muscular.  To sports med 3.  Arthralgias-different areas.  Check RF, B12-to sports med as no specific pattern  No orders of the defined types were placed in this encounter.   06/23/21, MD

## 2022-01-03 NOTE — Patient Instructions (Addendum)
Sports med [phone]

## 2022-01-04 LAB — RHEUMATOID FACTOR: Rheumatoid fact SerPl-aCnc: <14 IU/mL (ref <14)

## 2022-01-10 NOTE — Progress Notes (Unsigned)
   I, Philbert Riser, LAT, ATC acting as a scribe for Clementeen Graham, MD.  Subjective:    CC: Polyarthralgia  HPI: Pt is a 31 y/o female c/o polyarthralgia. Pt locates pain to   Dx imaging:09/16/21 Chest & C-spine XR  Pertinent review of Systems: ***  Relevant historical information: ***   Objective:   There were no vitals filed for this visit. General: Well Developed, well nourished, and in no acute distress.   MSK: ***  Lab and Radiology Results No results found for this or any previous visit (from the past 72 hour(s)). No results found.    Impression and Recommendations:    Assessment and Plan: 31 y.o. female with ***.  PDMP not reviewed this encounter. No orders of the defined types were placed in this encounter.  No orders of the defined types were placed in this encounter.   Discussed warning signs or symptoms. Please see discharge instructions. Patient expresses understanding.   ***

## 2022-01-11 ENCOUNTER — Ambulatory Visit (INDEPENDENT_AMBULATORY_CARE_PROVIDER_SITE_OTHER): Payer: Managed Care, Other (non HMO) | Admitting: Family Medicine

## 2022-01-11 VITALS — BP 110/68 | HR 90 | Ht 64.0 in | Wt 136.4 lb

## 2022-01-11 DIAGNOSIS — R0789 Other chest pain: Secondary | ICD-10-CM

## 2022-01-11 DIAGNOSIS — M5412 Radiculopathy, cervical region: Secondary | ICD-10-CM | POA: Diagnosis not present

## 2022-01-11 NOTE — Patient Instructions (Addendum)
Thank you for coming in today.   I've referred you to Physical Therapy.  Let us know if you don't hear from them in one week.   Recheck in about 6 weeks.   Let me know if this is not working.   If you have a question or are concerned or would like some other test before you come back let me know.

## 2022-01-24 NOTE — Therapy (Unsigned)
OUTPATIENT PHYSICAL THERAPY CERVICAL EVALUATION   Patient Name: Sierra Hernandez MRN: [id number] DOB:Mar 17, 1991, 31 y.o., female Today's Date: 01/25/2022   PT End of Session - 01/25/22 0846     Visit Number 1    Number of Visits 12    Date for PT Re-Evaluation 04/05/22    Authorization Type cigna    PT Start Time [phone]    PT Stop Time 0930    PT Time Calculation (min) 44 min    Activity Tolerance Patient tolerated treatment well             Past Medical History:  Diagnosis Date   Allergy    Asthma    Elevated LFTs    Gallstones    IBS (irritable bowel syndrome)    Pancreatitis 01/2017   Scoliosis    Past Surgical History:  Procedure Laterality Date   CHOLECYSTECTOMY N/A 02/05/2017   Procedure: LAPAROSCOPIC CHOLECYSTECTOMY WITH INTRAOPERATIVE CHOLANGIOGRAM;  Surgeon: Harriette Bouillon, MD;  Location: [facility] OR;  Service: General;  Laterality: N/A;   ERCP N/A 02/06/2017   Procedure: ENDOSCOPIC RETROGRADE CHOLANGIOPANCREATOGRAPHY (ERCP);  Surgeon: Sherrilyn Rist, MD;  Location[facility] ENDOSCOPY;  Service: Gastroenterology;  Laterality: N/A;   MOLE REMOVAL     x 2 on back   Patient Active Problem List   Diagnosis Date Noted   Epigastric pain 12/20/2018   Intraabdominal fluid collection    Abnormal liver function tests    Protein-calorie malnutrition, severe 02/14/2017   Abdominal distention    Abdominal fluid collection    Acute pancreatitis 02/08/2017   Gallstones    Elevated LFTs     PCP: Jeani Sow, MD  REFERRING PROVIDER: Rodolph Bong, MD  REFERRING DIAG: R07.89 (ICD-10-CM) - Other chest pain M54.12 (ICD-10-CM) - Cervical radiculitis  THERAPY DIAG:  Cervicalgia  Muscle weakness (generalized)  Rationale for Evaluation and Treatment Rehabilitation  ONSET DATE: April 2023  SUBJECTIVE:                                                                                                                                                                                                          SUBJECTIVE STATEMENT: States that she has pain across her chest and into her arm and it happens on both sides but L>R. States that she goes to body pump and piliates once a week. States that the pain would come about a week after and she felt deep in her chest pain. States that she has stopped going to her classes for the last 2 months and the pain has  resolved mostly.  States that occasionally she reaches overhead into her cabinets she feels it.   PERTINENT HISTORY:  Hx of gall bladder removal   PAIN:  Are you having pain? Yes: NPRS scale: 5/10 Pain location: deep in the chest  under her breast Pain description: sharp, short lived Aggravating factors: body pump classes Relieving factors: rest  PRECAUTIONS: None  WEIGHT BEARING RESTRICTIONS No  FALLS:  Has patient fallen in last 6 months? 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 Has standing desk at home, uses laptop and screen  PLOF: Independent  PATIENT GOALS to be able to return to classes  OBJECTIVE:   DIAGNOSTIC FINDINGS:  Xray of chest and neck normal   COGNITION: Overall cognitive status: Within functional limits for tasks assessed   POSTURE: rounded shoulders, forward head, increased thoracic kyphosis, and flexed trunk   PALPATION: Tenderness to palpation along intercostals L>R anterior. Hypomobility in PA to ribs with reproduction of symptoms ribs 3-5   CERVICAL ROM:   Active ROM A/PROM (deg) eval  Flexion 40 (tightness)  Extension WFL (tightness)  Right lateral flexion 20 (tightness)  Left lateral flexion 20 (tightness)  Right rotation 60  Left rotation 45   (Blank rows = not tested)    UE Measurements Upper Extremity Right 01/25/2022 Left 01/25/2022   A/PROM MMT A/PROM MMT  Shoulder Flexion 165 4* 160** 4  Shoulder Extension      Shoulder Abduction  4-*  4  Shoulder Adduction      Shoulder Internal Rotation Reaches to base of right shoulder blade  3+ Reaches to T4SP 3+  Shoulder External Rotation Reaches to T4SP 3+ Reaches to T4SP 3+  Elbow Flexion      Elbow Extension      Wrist Flexion      Wrist Extension      Wrist Supination      Wrist Pronation      Wrist Ulnar Deviation      Wrist Radial Deviation      Grip Strength NA  NA     (Blank rows = not tested)   * pain   **tightness Right handed   CERVICAL SPECIAL TESTS:  Neg Spurling + R median neural tension Limited pec and lat length bilaterally    TODAY'S TREATMENT:  01/25/2022. Therapeutic Exercise:  Aerobic: Supine: diaphragmatic breathing, long exhale breathing, self mobilization to anterior ribs Prone:  Seated:tall posture, right median nerve glide  Standing: pec stretch at wall x2 5" holds Neuromuscular Re-education: Manual Therapy: Therapeutic Activity: Self Care: Trigger Point Dry Needling:  Modalities:      PATIENT EDUCATION:  Education details: on current presentation, on HEP, on clinical outcomes score and POC, on breathing exercises and how to perform, to avoid weighted overhead lifts at this time Person educated: Patient Education method: Explanation, Demonstration, and Handouts Education comprehension: verbalized understanding  HOME EXERCISE PROGRAM: [id number]  ASSESSMENT:  CLINICAL IMPRESSION: Patient presents to therapy with complaints of chest and UE pain that presents after body pump class. Educated patient in current findings, limitations sin mobility and plan moving forward. Patient tolerated session well and would benefit form skilled PT to reduce risk of injury and return her to optimal function.   OBJECTIVE IMPAIRMENTS decreased activity tolerance, decreased ROM, decreased strength, postural dysfunction, and pain.   ACTIVITY LIMITATIONS lifting  PARTICIPATION LIMITATIONS:  body pump classes  PERSONAL FACTORS Fitness are also affecting patient's functional outcome.   REHAB POTENTIAL: Excellent  CLINICAL DECISION MAKING:  Stable/uncomplicated  EVALUATION COMPLEXITY: Low   GOALS: Goals reviewed with patient?  yes  SHORT TERM GOALS:  Patient will be independent in self management strategies to improve quality of life and functional outcomes. Baseline: new program Target date: 03/01/2022 Goal status: INITIAL  2.  Patient will report at least 50% improvement in overall symptoms and/or function to demonstrate improved functional mobility Baseline: 0% Target date: 03/01/2022 Goal status: INITIAL  3.  Patient will be able to reach overhead and place dishes in overhead  shelf without pain to improve overall function Baseline: painful Target date: 03/01/2022 Goal status: INITIAL      LONG TERM GOALS:  Patient will report at least 75% improvement in overall symptoms and/or function to demonstrate improved functional mobility Baseline: 0% Target date: 04/05/2022 Goal status: INITIAL  2.  Patient will demonstrate at least 4/5 MMT in B UE to demonstrate improved strength in arms Baseline: see above Target date: 04/05/2022 Goal status: INITIAL  3.  Patient will report sitting in more active posture throughout the day to reduce stress on posterior chain Baseline: not currently Target date: 04/05/2022 Goal status: INITIAL        PLAN: PT FREQUENCY: 1-2x/week for total of 12 visits over 10 week certification period   PT DURATION: 10 weeks  PLANNED INTERVENTIONS: Therapeutic exercises, Therapeutic activity, Neuromuscular re-education, Balance training, Gait training, Patient/Family education, Self Care, Joint mobilization, Joint manipulation, Stair training, Aquatic Therapy, Dry Needling, Electrical stimulation, Spinal manipulation, Spinal mobilization, Cryotherapy, Moist heat, Traction, Ultrasound, Ionotophoresis 4mg /ml Dexamethasone, and Manual therapy  PLAN FOR NEXT SESSION: lat and pec stretches, posture exercises, chin tucks, scapular protraction,, breath work and rib/thoracic mobility  10:53  AM, 01/25/22 Jerene Pitch, DPT Physical Therapy with [facility]

## 2022-01-25 ENCOUNTER — Ambulatory Visit (INDEPENDENT_AMBULATORY_CARE_PROVIDER_SITE_OTHER): Payer: Managed Care, Other (non HMO) | Admitting: Physical Therapy

## 2022-01-25 ENCOUNTER — Encounter: Payer: Self-pay | Admitting: Physical Therapy

## 2022-01-25 DIAGNOSIS — M542 Cervicalgia: Secondary | ICD-10-CM | POA: Diagnosis not present

## 2022-01-25 DIAGNOSIS — M6281 Muscle weakness (generalized): Secondary | ICD-10-CM

## 2022-01-31 NOTE — Therapy (Signed)
OUTPATIENT PHYSICAL THERAPY TREATMENT NOTE   Patient Name: Sierra Hernandez MRN: [id number] DOB:1991/04/30, 31 y.o., female Today's Date: 02/03/2022  PCP: Jeani Sow, MD   REFERRING PROVIDER: Rodolph Bong, MD   END OF SESSION:   PT End of Session - 02/03/22 1016     Visit Number 2    Number of Visits 12    Date for PT Re-Evaluation 04/05/22    Authorization Type cigna    PT Start Time 0845    PT Stop Time 0928    PT Time Calculation (min) 43 min    Activity Tolerance Patient tolerated treatment well    Behavior During Therapy [facility] for tasks assessed/performed             Past Medical History:  Diagnosis Date   Allergy    Asthma    Elevated LFTs    Gallstones    IBS (irritable bowel syndrome)    Pancreatitis 01/2017   Scoliosis    Past Surgical History:  Procedure Laterality Date   CHOLECYSTECTOMY N/A 02/05/2017   Procedure: LAPAROSCOPIC CHOLECYSTECTOMY WITH INTRAOPERATIVE CHOLANGIOGRAM;  Surgeon: Harriette Bouillon, MD;  Location: [facility] OR;  Service: General;  Laterality: N/A;   ERCP N/A 02/06/2017   Procedure: ENDOSCOPIC RETROGRADE CHOLANGIOPANCREATOGRAPHY (ERCP);  Surgeon: Sherrilyn Rist, MD;  Location: [facility] ENDOSCOPY;  Service: Gastroenterology;  Laterality: N/A;   MOLE REMOVAL     x 2 on back   Patient Active Problem List   Diagnosis Date Noted   Epigastric pain 12/20/2018   Intraabdominal fluid collection    Abnormal liver function tests    Protein-calorie malnutrition, severe 02/14/2017   Abdominal distention    Abdominal fluid collection    Acute pancreatitis 02/08/2017   Gallstones    Elevated LFTs      REFERRING DIAG: R07.89 (ICD-10-CM) - Other chest pain M54.12 (ICD-10-CM) - Cervical radiculitis  THERAPY DIAG:  Cervicalgia  Muscle weakness (generalized)  Rationale for Evaluation and Treatment Rehabilitation  PERTINENT HISTORY: Hx of gall bladder removal   PRECAUTIONS: None   SUBJECTIVE: Pt states that's that she noted continued  pain with carrying heavy objects last weekend.   EVAL: States that she has pain across her chest and into her arm and it happens on both sides but L>R. States that she goes to body pump and piliates once a week. States that the pain would come about a week after and she felt deep in her chest pain. States that she has stopped going to her classes for the last 2 months and the pain has resolved mostly.  States that occasionally she reaches overhead into her cabinets she feels it.  PAIN:  Are you having pain? Yes: NPRS scale: 5/10 Pain location: deep in the chest  under her breast Pain description: sharp, short lived Aggravating factors: body pump classes Relieving factors: rest OBJECTIVE:    DIAGNOSTIC FINDINGS:  Xray of chest and neck normal     COGNITION: Overall cognitive status: Within functional limits for tasks assessed     POSTURE: rounded shoulders, forward head, increased thoracic kyphosis, and flexed trunk    PALPATION: Tenderness to palpation along intercostals L>R anterior. Hypomobility in PA to ribs with reproduction of symptoms ribs 3-5       CERVICAL ROM:    Active ROM A/PROM (deg) eval  Flexion 40 (tightness)  Extension WFL (tightness)  Right lateral flexion 20 (tightness)  Left lateral flexion 20 (tightness)  Right rotation 60  Left rotation 45   (Blank  rows = not tested)                UE Measurements       Upper Extremity Right 01/25/2022 Left 01/25/2022    A/PROM MMT A/PROM MMT  Shoulder Flexion 165 4* 160** 4  Shoulder Extension          Shoulder Abduction   4-*   4  Shoulder Adduction          Shoulder Internal Rotation Reaches to base of right shoulder blade 3+ Reaches to T4SP 3+  Shoulder External Rotation Reaches to T4SP 3+ Reaches to T4SP 3+  Elbow Flexion          Elbow Extension          Wrist Flexion          Wrist Extension          Wrist Supination          Wrist Pronation          Wrist Ulnar Deviation          Wrist Radial  Deviation          Grip Strength NA   NA                          (Blank rows = not tested)                       * pain                       **tightness Right handed     CERVICAL SPECIAL TESTS:  Neg Spurling + R median neural tension Limited pec and lat length bilaterally      TODAY'S TREATMENT:  01/25/2022. Therapeutic Exercise:            Aerobic: Supine: diaphragmatic breathing, long exhale breathing, self mobilization to anterior ribs, laying on rolled towel with arms in 90/90 for pec stretch.  Prone:            Seated:            Standing: pec stretch at wall x2 5" holds, ER 2x10 RTB, Rows 2x10, Bilat ER with RTB x20  Neuromuscular Re-education: Manual Therapy: Therapeutic Activity: Self Care: Trigger Point Dry Needling:  Modalities:          PATIENT EDUCATION:  Education details: on current presentation, on HEP, on clinical outcomes score and POC, on breathing exercises and how to perform, to avoid weighted overhead lifts at this time Person educated: Patient Education method: Explanation, Demonstration, and Handouts Education comprehension: verbalized understanding   HOME EXERCISE PROGRAM: Access Code: [email or web address] URL: [email or web address] Date: 02/03/2022 Prepared by: Royal Hawthorn  Exercises - Shoulder External Rotation with Anchored Resistance  - 2 x daily - 7 x weekly - 2 sets - 10 reps - Shoulder External Rotation and Scapular Retraction with Resistance  - 2 x daily - 7 x weekly - 2 sets - 10 reps - Standing Row with Anchored Resistance  - 2 x daily - 7 x weekly - 2 sets - 10 reps - Shoulder Extension with Resistance  - 2 x daily - 7 x weekly - 2 sets - 10 reps   ASSESSMENT:   CLINICAL IMPRESSION: Patient presents to PT with reports of intermittent pain with OH reaching/ carrying. Session with focus on parascapular strengthening, pec mobility. Pt reports increased muscle fatigue with prescribed  exercises today, but denies any increased pain.  Plan to continue with strengthening exercises as tolerated.      OBJECTIVE IMPAIRMENTS decreased activity tolerance, decreased ROM, decreased strength, postural dysfunction, and pain.    ACTIVITY LIMITATIONS lifting   PARTICIPATION LIMITATIONS:  body pump classes   PERSONAL FACTORS Fitness are also affecting patient's functional outcome.    REHAB POTENTIAL: Excellent   CLINICAL DECISION MAKING: Stable/uncomplicated   EVALUATION COMPLEXITY: Low     GOALS: Goals reviewed with patient?  yes   SHORT TERM GOALS:   Patient will be independent in self management strategies to improve quality of life and functional outcomes. Baseline: new program Target date: 03/01/2022 Goal status: INITIAL   2.  Patient will report at least 50% improvement in overall symptoms and/or function to demonstrate improved functional mobility Baseline: 0% Target date: 03/01/2022 Goal status: INITIAL   3.  Patient will be able to reach overhead and place dishes in overhead  shelf without pain to improve overall function Baseline: painful Target date: 03/01/2022 Goal status: INITIAL         LONG TERM GOALS:   Patient will report at least 75% improvement in overall symptoms and/or function to demonstrate improved functional mobility Baseline: 0% Target date: 04/05/2022 Goal status: INITIAL   2.  Patient will demonstrate at least 4/5 MMT in B UE to demonstrate improved strength in arms Baseline: see above Target date: 04/05/2022 Goal status: INITIAL   3.  Patient will report sitting in more active posture throughout the day to reduce stress on posterior chain Baseline: not currently Target date: 04/05/2022 Goal status: INITIAL            PLAN: PT FREQUENCY: 1-2x/week for total of 12 visits over 10 week certification period    PT DURATION: 10 weeks   PLANNED INTERVENTIONS: Therapeutic exercises, Therapeutic activity, Neuromuscular re-education, Balance training, Gait training, Patient/Family  education, Self Care, Joint mobilization, Joint manipulation, Stair training, Aquatic Therapy, Dry Needling, Electrical stimulation, Spinal manipulation, Spinal mobilization, Cryotherapy, Moist heat, Traction, Ultrasound, Ionotophoresis 4mg /ml Dexamethasone, and Manual therapy   PLAN FOR NEXT SESSION: lat and pec stretches, posture exercises, chin tucks, scapular protraction,, breath work and rib/thoracic mobility    , PT 02/03/2022, 10:17 AM

## 2022-02-03 ENCOUNTER — Encounter: Payer: Self-pay | Admitting: Physical Therapy

## 2022-02-03 ENCOUNTER — Ambulatory Visit (INDEPENDENT_AMBULATORY_CARE_PROVIDER_SITE_OTHER): Payer: Managed Care, Other (non HMO) | Admitting: Physical Therapy

## 2022-02-03 DIAGNOSIS — M542 Cervicalgia: Secondary | ICD-10-CM | POA: Diagnosis not present

## 2022-02-03 DIAGNOSIS — M6281 Muscle weakness (generalized): Secondary | ICD-10-CM

## 2022-02-06 NOTE — Therapy (Signed)
OUTPATIENT PHYSICAL THERAPY TREATMENT NOTE   Patient Name: Sierra Hernandez MRN: [id number] DOB:12-07-1990, 31 y.o., female Today's Date: 02/10/2022  PCP: Tawnya Crook, MD   REFERRING PROVIDER: Gregor Hams, MD   END OF SESSION:   PT End of Session - 02/10/22 [id number]     Visit Number 3    Number of Visits 12    Date for PT Re-Evaluation 04/05/22    Authorization Type cigna    PT Start Time 0800    PT Stop Time 0840    PT Time Calculation (min) 40 min    Activity Tolerance Patient tolerated treatment well    Behavior During Therapy [facility] for tasks assessed/performed              Past Medical History:  Diagnosis Date   Allergy    Asthma    Elevated LFTs    Gallstones    IBS (irritable bowel syndrome)    Pancreatitis 01/2017   Scoliosis    Past Surgical History:  Procedure Laterality Date   CHOLECYSTECTOMY N/A 02/05/2017   Procedure: LAPAROSCOPIC CHOLECYSTECTOMY WITH INTRAOPERATIVE CHOLANGIOGRAM;  Surgeon: Erroll Luna, MD;  Location: [place];  Service: General;  Laterality: N/A;   ERCP N/A 02/06/2017   Procedure: ENDOSCOPIC RETROGRADE CHOLANGIOPANCREATOGRAPHY (ERCP);  Surgeon: Doran Stabler, MD;  Location: [place];  Service: Gastroenterology;  Laterality: N/A;   MOLE REMOVAL     x 2 on back   Patient Active Problem List   Diagnosis Date Noted   Epigastric pain 12/20/2018   Intraabdominal fluid collection    Abnormal liver function tests    Protein-calorie malnutrition, severe 02/14/2017   Abdominal distention    Abdominal fluid collection    Acute pancreatitis 02/08/2017   Gallstones    Elevated LFTs      REFERRING DIAG: R07.89 (ICD-10-CM) - Other chest pain M54.12 (ICD-10-CM) - Cervical radiculitis  THERAPY DIAG:  Cervicalgia  Muscle weakness (generalized)  Rationale for Evaluation and Treatment Rehabilitation  PERTINENT HISTORY: Hx of gall bladder removal   PRECAUTIONS: None   SUBJECTIVE: Pt states that's that she noted continued  pain with carrying heavy objects last weekend.   EVAL: States that she has pain across her chest and into her arm and it happens on both sides but L>R. States that she goes to body pump and piliates once a week. States that the pain would come about a week after and she felt deep in her chest pain. States that she has stopped going to her classes for the last 2 months and the pain has resolved mostly.  States that occasionally she reaches overhead into her cabinets she feels it.  PAIN:  Are you having pain? Yes: NPRS scale: 5/10 Pain location: deep in the chest  under her breast Pain description: sharp, short lived Aggravating factors: body pump classes Relieving factors: rest OBJECTIVE:    DIAGNOSTIC FINDINGS:  Xray of chest and neck normal     COGNITION: Overall cognitive status: Within functional limits for tasks assessed     POSTURE: rounded shoulders, forward head, increased thoracic kyphosis, and flexed trunk    PALPATION: Tenderness to palpation along intercostals L>R anterior. Hypomobility in PA to ribs with reproduction of symptoms ribs 3-5       CERVICAL ROM:    Active ROM A/PROM (deg) eval  Flexion 40 (tightness)  Extension WFL (tightness)  Right lateral flexion 20 (tightness)  Left lateral flexion 20 (tightness)  Right rotation 60  Left rotation 45   (  Blank rows = not tested)                UE Measurements       Upper Extremity Right 01/25/2022 Left 01/25/2022    A/PROM MMT A/PROM MMT  Shoulder Flexion 165 4* 160** 4  Shoulder Extension          Shoulder Abduction   4-*   4  Shoulder Adduction          Shoulder Internal Rotation Reaches to base of right shoulder blade 3+ Reaches to T4SP 3+  Shoulder External Rotation Reaches to T4SP 3+ Reaches to T4SP 3+  Elbow Flexion          Elbow Extension          Wrist Flexion          Wrist Extension          Wrist Supination          Wrist Pronation          Wrist Ulnar Deviation          Wrist Radial  Deviation          Grip Strength NA   NA                          (Blank rows = not tested)                       * pain                       **tightness Right handed     CERVICAL SPECIAL TESTS:  Neg Spurling + R median neural tension Limited pec and lat length bilaterally      TODAY'S TREATMENT:  02/10/2022 [facility] Adult PT Treatment:                                                DATE: 02/10/2022 Therapeutic Exercise: UBE 2 min fwd/ 2 min bkwd Diaphragmatic breathing, long exhale breathing Sit to stand with OH press 15# kettle bell x15 ER 2x10 RTB Rows 2x10 Bilat ER with RTB x20 OH scaption with 3# Bilat x15  Cat/Cow x15 quadruped.  Open books 10x each way Body blade 90/90 1 min each arm   01/25/2022. Therapeutic Exercise:            Aerobic: Supine: diaphragmatic breathing, long exhale breathing, self mobilization to anterior ribs, laying on rolled towel with arms in 90/90 for pec stretch.  Prone:            Seated:             Standing: pec stretch at wall x2 5" holds, ER 2x10 RTB, Rows 2x10, Bilat ER with RTB x20, OH scaption with 3# Bilat x15  Neuromuscular Re-education: Manual Therapy: Therapeutic Activity: Self Care: Trigger Point Dry Needling:  Modalities:          PATIENT EDUCATION:  Education details: on current presentation, on HEP, on clinical outcomes score and POC, on breathing exercises and how to perform, to avoid weighted overhead lifts at this time Person educated: Patient Education method: Explanation, Demonstration, and Handouts Education comprehension: verbalized understanding   HOME EXERCISE PROGRAM: Access Code: [email or web address] URL: [email or web address] Date: 02/03/2022 Prepared by: Royal Hawthorn  Exercises -  Shoulder External Rotation with Anchored Resistance  - 2 x daily - 7 x weekly - 2 sets - 10 reps - Shoulder External Rotation and Scapular Retraction with Resistance  - 2 x daily - 7 x weekly - 2 sets - 10 reps - Standing Row  with Anchored Resistance  - 2 x daily - 7 x weekly - 2 sets - 10 reps - Shoulder Extension with Resistance  - 2 x daily - 7 x weekly - 2 sets - 10 reps   ASSESSMENT:   CLINICAL IMPRESSION: Patient presents to PT with no familiar pain since last week. Session with continued focus on parascapular strengthening, pec mobility. Challenged pt with OH reaching activities today to mimic motions that usually reproduce pain. Pt reports no reproduction of of pain, but increased fatigue. Pt performs exercises with good, slow controled form. Plan to continue with strengthening exercises as tolerated.      OBJECTIVE IMPAIRMENTS decreased activity tolerance, decreased ROM, decreased strength, postural dysfunction, and pain.    ACTIVITY LIMITATIONS lifting   PARTICIPATION LIMITATIONS:  body pump classes   PERSONAL FACTORS Fitness are also affecting patient's functional outcome.    REHAB POTENTIAL: Excellent   CLINICAL DECISION MAKING: Stable/uncomplicated   EVALUATION COMPLEXITY: Low     GOALS: Goals reviewed with patient?  yes   SHORT TERM GOALS:   Patient will be independent in self management strategies to improve quality of life and functional outcomes. Baseline: new program Target date: 03/01/2022 Goal status: INITIAL   2.  Patient will report at least 50% improvement in overall symptoms and/or function to demonstrate improved functional mobility Baseline: 0% Target date: 03/01/2022 Goal status: INITIAL   3.  Patient will be able to reach overhead and place dishes in overhead  shelf without pain to improve overall function Baseline: painful Target date: 03/01/2022 Goal status: INITIAL         LONG TERM GOALS:   Patient will report at least 75% improvement in overall symptoms and/or function to demonstrate improved functional mobility Baseline: 0% Target date: 04/05/2022 Goal status: INITIAL   2.  Patient will demonstrate at least 4/5 MMT in B UE to demonstrate improved strength  in arms Baseline: see above Target date: 04/05/2022 Goal status: INITIAL   3.  Patient will report sitting in more active posture throughout the day to reduce stress on posterior chain Baseline: not currently Target date: 04/05/2022 Goal status: INITIAL     PLAN: PT FREQUENCY: 1-2x/week for total of 12 visits over 10 week certification period    PT DURATION: 10 weeks   PLANNED INTERVENTIONS: Therapeutic exercises, Therapeutic activity, Neuromuscular re-education, Balance training, Gait training, Patient/Family education, Self Care, Joint mobilization, Joint manipulation, Stair training, Aquatic Therapy, Dry Needling, Electrical stimulation, Spinal manipulation, Spinal mobilization, Cryotherapy, Moist heat, Traction, Ultrasound, Ionotophoresis 4mg /ml Dexamethasone, and Manual therapy   PLAN FOR NEXT SESSION: lat and pec stretches, posture exercises, chin tucks, scapular protraction,, breath work and rib/thoracic mobility    , PT 02/10/2022, 8:44 AM

## 2022-02-10 ENCOUNTER — Encounter: Payer: Self-pay | Admitting: Physical Therapy

## 2022-02-10 ENCOUNTER — Ambulatory Visit (INDEPENDENT_AMBULATORY_CARE_PROVIDER_SITE_OTHER): Payer: Managed Care, Other (non HMO) | Admitting: Physical Therapy

## 2022-02-10 DIAGNOSIS — M6281 Muscle weakness (generalized): Secondary | ICD-10-CM

## 2022-02-10 DIAGNOSIS — M542 Cervicalgia: Secondary | ICD-10-CM | POA: Diagnosis not present

## 2022-02-16 ENCOUNTER — Ambulatory Visit (INDEPENDENT_AMBULATORY_CARE_PROVIDER_SITE_OTHER): Payer: Managed Care, Other (non HMO) | Admitting: Physical Therapy

## 2022-02-16 ENCOUNTER — Encounter: Payer: Self-pay | Admitting: Physical Therapy

## 2022-02-16 DIAGNOSIS — M542 Cervicalgia: Secondary | ICD-10-CM | POA: Diagnosis not present

## 2022-02-16 DIAGNOSIS — M6281 Muscle weakness (generalized): Secondary | ICD-10-CM

## 2022-02-16 NOTE — Patient Instructions (Signed)
Laying on your stomach with small towel under forward to keep head straight Arms can be down by your sides with small towel under front of shoulder to support or arms out by your side  Focus on sinking into the floor/table and long exhale/inhale - 5-10 minutes Still on stomach - bring arms up by ear - and hang out here to stretch With one arm reach down back as tolerated - can place opposite arm underneath the reaching arms elbow for added stretch- make sure it is NOT uncomfortable  With any stretch we then follow up with end range muscle activation so reach for 5 seconds then relax Stretches can be held from 20 seconds to 2 minutes Still on stomach - can place a pillow under pelvis, arms are supported and out to the side - Try to lift lower ribs off the table - without engaging the butt muscles! Make sure to relax them if they engage, and then long inhale and exhale - focusing on trying to keep the ribs off the table - 5 -10 minutes. Spikey ball or firm ball massage to muscle along back

## 2022-02-16 NOTE — Therapy (Signed)
OUTPATIENT PHYSICAL THERAPY TREATMENT NOTE   Patient Name: Sierra Hernandez MRN: [id number] DOB:1990/08/14, 31 y.o., female Today's Date: 02/16/2022  PCP: Tawnya Crook, MD   REFERRING PROVIDER: Gregor Hams, MD   END OF SESSION:   PT End of Session - 02/16/22 0848     Visit Number 4    Number of Visits 12    Date for PT Re-Evaluation 04/05/22    Authorization Type cigna    PT Start Time 0848    PT Stop Time 0928    PT Time Calculation (min) 40 min    Activity Tolerance Patient tolerated treatment well    Behavior During Therapy [facility] for tasks assessed/performed              Past Medical History:  Diagnosis Date   Allergy    Asthma    Elevated LFTs    Gallstones    IBS (irritable bowel syndrome)    Pancreatitis 01/2017   Scoliosis    Past Surgical History:  Procedure Laterality Date   CHOLECYSTECTOMY N/A 02/05/2017   Procedure: LAPAROSCOPIC CHOLECYSTECTOMY WITH INTRAOPERATIVE CHOLANGIOGRAM;  Surgeon: Erroll Luna, MD;  Location: [place];  Service: General;  Laterality: N/A;   ERCP N/A 02/06/2017   Procedure: ENDOSCOPIC RETROGRADE CHOLANGIOPANCREATOGRAPHY (ERCP);  Surgeon: Doran Stabler, MD;  Location: [place];  Service: Gastroenterology;  Laterality: N/A;   MOLE REMOVAL     x 2 on back   Patient Active Problem List   Diagnosis Date Noted   Epigastric pain 12/20/2018   Intraabdominal fluid collection    Abnormal liver function tests    Protein-calorie malnutrition, severe 02/14/2017   Abdominal distention    Abdominal fluid collection    Acute pancreatitis 02/08/2017   Gallstones    Elevated LFTs      REFERRING DIAG: R07.89 (ICD-10-CM) - Other chest pain M54.12 (ICD-10-CM) - Cervical radiculitis  THERAPY DIAG:  Cervicalgia  Muscle weakness (generalized)  Rationale for Evaluation and Treatment Rehabilitation  PERTINENT HISTORY: Hx of gall bladder removal   PRECAUTIONS: None   SUBJECTIVE: States she has not gone back to her  classes. No real pain right now.   EVAL: States that she has pain across her chest and into her arm and it happens on both sides but L>R. States that she goes to body pump and piliates once a week. States that the pain would come about a week after and she felt deep in her chest pain. States that she has stopped going to her classes for the last 2 months and the pain has resolved mostly.  States that occasionally she reaches overhead into her cabinets she feels it.  PAIN:  Are you having pain? no: NPRS scale: 0/10 Pain location: deep in the chest  under her breast Pain description: sharp, short lived Aggravating factors: body pump classes Relieving factors: rest OBJECTIVE:    DIAGNOSTIC FINDINGS:  Xray of chest and neck normal     COGNITION: Overall cognitive status: Within functional limits for tasks assessed     POSTURE: rounded shoulders, forward head, increased thoracic kyphosis, and flexed trunk    PALPATION: Tenderness to palpation along intercostals L>R anterior. Hypomobility in PA to ribs with reproduction of symptoms ribs 3-5       CERVICAL ROM:    Active ROM A/PROM (deg) eval  Flexion 40 (tightness)  Extension WFL (tightness)  Right lateral flexion 20 (tightness)  Left lateral flexion 20 (tightness)  Right rotation 60  Left rotation 45   (  Blank rows = not tested)                UE Measurements       Upper Extremity Right 01/25/2022 Left 01/25/2022    A/PROM MMT A/PROM MMT  Shoulder Flexion 165 4* 160** 4  Shoulder Extension          Shoulder Abduction   4-*   4  Shoulder Adduction          Shoulder Internal Rotation Reaches to base of right shoulder blade 3+ Reaches to T4SP 3+  Shoulder External Rotation Reaches to T4SP 3+ Reaches to T4SP 3+  Elbow Flexion          Elbow Extension          Wrist Flexion          Wrist Extension          Wrist Supination          Wrist Pronation          Wrist Ulnar Deviation          Wrist Radial Deviation           Grip Strength NA   NA                          (Blank rows = not tested)                       * pain                       **tightness Right handed     CERVICAL SPECIAL TESTS:  Neg Spurling + R median neural tension Limited pec and lat length bilaterally      TODAY'S TREATMENT:  02/16/2022  Therapeutic Exercise:            Aerobic: Supine:   Prone:            Seated:             Standing: self mobilization to right thoracic paraspinals Neuromuscular Re-education: prone breathing then with reaching overhead, then with reaching to the side then with lower rib raise - 25 minutes  Manual Therapy: STM to right thoracic paraspinals Therapeutic Activity: Self Care: Trigger Point Dry Needling:  Modalities:          PATIENT EDUCATION:  Education details: on HEP and rationale for breathing interventions Person educated: Patient Education method: Explanation, Demonstration, and Handouts Education comprehension: verbalized understanding   HOME EXERCISE PROGRAM: Access Code: [email or web address]    ASSESSMENT:   CLINICAL IMPRESSION: Session focused on breathing exercises and education. Tolerated this well. Restrictions noted along right thoracic paraspinals, this improved with mobilization to this area. No pain noted end of session. Will continue with current POC as tolerated.      OBJECTIVE IMPAIRMENTS decreased activity tolerance, decreased ROM, decreased strength, postural dysfunction, and pain.    ACTIVITY LIMITATIONS lifting   PARTICIPATION LIMITATIONS:  body pump classes   PERSONAL FACTORS Fitness are also affecting patient's functional outcome.    REHAB POTENTIAL: Excellent   CLINICAL DECISION MAKING: Stable/uncomplicated   EVALUATION COMPLEXITY: Low     GOALS: Goals reviewed with patient?  yes   SHORT TERM GOALS:   Patient will be independent in self management strategies to improve quality of life and functional outcomes. Baseline: new program Target date:  03/01/2022 Goal status: INITIAL   2.  Patient will  report at least 50% improvement in overall symptoms and/or function to demonstrate improved functional mobility Baseline: 0% Target date: 03/01/2022 Goal status: INITIAL   3.  Patient will be able to reach overhead and place dishes in overhead  shelf without pain to improve overall function Baseline: painful Target date: 03/01/2022 Goal status: INITIAL         LONG TERM GOALS:   Patient will report at least 75% improvement in overall symptoms and/or function to demonstrate improved functional mobility Baseline: 0% Target date: 04/05/2022 Goal status: INITIAL   2.  Patient will demonstrate at least 4/5 MMT in B UE to demonstrate improved strength in arms Baseline: see above Target date: 04/05/2022 Goal status: INITIAL   3.  Patient will report sitting in more active posture throughout the day to reduce stress on posterior chain Baseline: not currently Target date: 04/05/2022 Goal status: INITIAL     PLAN: PT FREQUENCY: 1-2x/week for total of 12 visits over 10 week certification period    PT DURATION: 10 weeks   PLANNED INTERVENTIONS: Therapeutic exercises, Therapeutic activity, Neuromuscular re-education, Balance training, Gait training, Patient/Family education, Self Care, Joint mobilization, Joint manipulation, Stair training, Aquatic Therapy, Dry Needling, Electrical stimulation, Spinal manipulation, Spinal mobilization, Cryotherapy, Moist heat, Traction, Ultrasound, Ionotophoresis 4mg /ml Dexamethasone, and Manual therapy   PLAN FOR NEXT SESSION: lat and pec stretches, posture exercises, chin tucks, scapular protraction,, breath work and rib/thoracic mobility   9:31 AM, 02/16/22 Jerene Pitch, DPT Physical Therapy with [facility]

## 2022-02-23 ENCOUNTER — Encounter: Payer: Self-pay | Admitting: Physical Therapy

## 2022-02-23 ENCOUNTER — Ambulatory Visit (INDEPENDENT_AMBULATORY_CARE_PROVIDER_SITE_OTHER): Payer: Managed Care, Other (non HMO) | Admitting: Physical Therapy

## 2022-02-23 DIAGNOSIS — M542 Cervicalgia: Secondary | ICD-10-CM

## 2022-02-23 DIAGNOSIS — M6281 Muscle weakness (generalized): Secondary | ICD-10-CM | POA: Diagnosis not present

## 2022-02-23 NOTE — Therapy (Signed)
OUTPATIENT PHYSICAL THERAPY TREATMENT NOTE   Patient Name: Sierra Hernandez MRN: [id number] DOB:08-18-1990, 31 y.o., female Today's Date: 02/23/2022  PCP: Tawnya Crook, MD   REFERRING PROVIDER: Gregor Hams, MD   END OF SESSION:   PT End of Session - 02/23/22 0851     Visit Number 5    Number of Visits 12    Date for PT Re-Evaluation 04/05/22    Authorization Type cigna    PT Start Time [phone]    PT Stop Time 0930    PT Time Calculation (min) 38 min    Activity Tolerance Patient tolerated treatment well    Behavior During Therapy [facility] for tasks assessed/performed              Past Medical History:  Diagnosis Date   Allergy    Asthma    Elevated LFTs    Gallstones    IBS (irritable bowel syndrome)    Pancreatitis 01/2017   Scoliosis    Past Surgical History:  Procedure Laterality Date   CHOLECYSTECTOMY N/A 02/05/2017   Procedure: LAPAROSCOPIC CHOLECYSTECTOMY WITH INTRAOPERATIVE CHOLANGIOGRAM;  Surgeon: Erroll Luna, MD;  Location: [place];  Service: General;  Laterality: N/A;   ERCP N/A 02/06/2017   Procedure: ENDOSCOPIC RETROGRADE CHOLANGIOPANCREATOGRAPHY (ERCP);  Surgeon: Doran Stabler, MD;  Location: [place];  Service: Gastroenterology;  Laterality: N/A;   MOLE REMOVAL     x 2 on back   Patient Active Problem List   Diagnosis Date Noted   Epigastric pain 12/20/2018   Intraabdominal fluid collection    Abnormal liver function tests    Protein-calorie malnutrition, severe 02/14/2017   Abdominal distention    Abdominal fluid collection    Acute pancreatitis 02/08/2017   Gallstones    Elevated LFTs      REFERRING DIAG: R07.89 (ICD-10-CM) - Other chest pain M54.12 (ICD-10-CM) - Cervical radiculitis  THERAPY DIAG:  Cervicalgia  Muscle weakness (generalized)  Rationale for Evaluation and Treatment Rehabilitation  PERTINENT HISTORY: Hx of gall bladder removal   PRECAUTIONS: None   SUBJECTIVE: States that she has been feeling that spot  a little more the last couple days. States she was standing and carrying things. States it feels a little present but no current pain. States she has not been able to do her exercises  EVAL: States that she has pain across her chest and into her arm and it happens on both sides but L>R. States that she goes to body pump and piliates once a week. States that the pain would come about a week after and she felt deep in her chest pain. States that she has stopped going to her classes for the last 2 months and the pain has resolved mostly.  States that occasionally she reaches overhead into her cabinets she feels it.  PAIN:  Are you having pain? no: NPRS scale: 0/10 Pain location: deep in the chest  under her breast Pain description: sharp, short lived Aggravating factors: body pump classes Relieving factors: rest OBJECTIVE:    DIAGNOSTIC FINDINGS:  Xray of chest and neck normal     COGNITION: Overall cognitive status: Within functional limits for tasks assessed     POSTURE: rounded shoulders, forward head, increased thoracic kyphosis, and flexed trunk    PALPATION: Tenderness to palpation along intercostals L>R anterior. Hypomobility in PA to ribs with reproduction of symptoms ribs 3-5       CERVICAL ROM:    Active ROM A/PROM (deg) eval  Flexion 40 (  tightness)  Extension WFL (tightness)  Right lateral flexion 20 (tightness)  Left lateral flexion 20 (tightness)  Right rotation 60  Left rotation 45   (Blank rows = not tested)                UE Measurements       Upper Extremity Right 01/25/2022 Left 01/25/2022    A/PROM MMT A/PROM MMT  Shoulder Flexion 165 4* 160** 4  Shoulder Extension          Shoulder Abduction   4-*   4  Shoulder Adduction          Shoulder Internal Rotation Reaches to base of right shoulder blade 3+ Reaches to T4SP 3+  Shoulder External Rotation Reaches to T4SP 3+ Reaches to T4SP 3+  Elbow Flexion          Elbow Extension          Wrist Flexion           Wrist Extension          Wrist Supination          Wrist Pronation          Wrist Ulnar Deviation          Wrist Radial Deviation          Grip Strength NA   NA                          (Blank rows = not tested)                       * pain                       **tightness Right handed     CERVICAL SPECIAL TESTS:  Neg Spurling + R median neural tension Limited pec and lat length bilaterally      TODAY'S TREATMENT:  02/23/2022  Therapeutic Exercise:            Aerobic: Supine:   Prone:            Seated:             Standing:  Neuromuscular Re-education: prone breathing then with reaching overhead, then with raising lower ribs off table, breathing into upper ribs for expansion and elevation with and without shoulder flexion Manual Therapy: STM to right thoracic paraspinals, pecs and intercostals B Therapeutic Activity: Self Care: Trigger Point Dry Needling:  Modalities:          PATIENT EDUCATION:  Education details: on HEP and rationale for breathing interventions Person educated: Patient Education method: Explanation, Demonstration, and Handouts Education comprehension: verbalized understanding   HOME EXERCISE PROGRAM: Access Code: [email or web address]    ASSESSMENT:   CLINICAL IMPRESSION: Session focused on continued assessment and education of current presentation and breathing mechanics. Patient with limited OH reach secondary to limited rib mobility and thoracic postures. Educated patient on this and focused on breathing and opening up upper ribs. Significant improvement in OH noted and with shoulder relaxing on table afterwards. Will follow up with this next session.     OBJECTIVE IMPAIRMENTS decreased activity tolerance, decreased ROM, decreased strength, postural dysfunction, and pain.    ACTIVITY LIMITATIONS lifting   PARTICIPATION LIMITATIONS:  body pump classes   PERSONAL FACTORS Fitness are also affecting patient's functional outcome.    REHAB  POTENTIAL: Excellent   CLINICAL DECISION MAKING: Stable/uncomplicated  EVALUATION COMPLEXITY: Low     GOALS: Goals reviewed with patient?  yes   SHORT TERM GOALS:   Patient will be independent in self management strategies to improve quality of life and functional outcomes. Baseline: new program Target date: 03/01/2022 Goal status: INITIAL   2.  Patient will report at least 50% improvement in overall symptoms and/or function to demonstrate improved functional mobility Baseline: 0% Target date: 03/01/2022 Goal status: INITIAL   3.  Patient will be able to reach overhead and place dishes in overhead  shelf without pain to improve overall function Baseline: painful Target date: 03/01/2022 Goal status: INITIAL         LONG TERM GOALS:   Patient will report at least 75% improvement in overall symptoms and/or function to demonstrate improved functional mobility Baseline: 0% Target date: 04/05/2022 Goal status: INITIAL   2.  Patient will demonstrate at least 4/5 MMT in B UE to demonstrate improved strength in arms Baseline: see above Target date: 04/05/2022 Goal status: INITIAL   3.  Patient will report sitting in more active posture throughout the day to reduce stress on posterior chain Baseline: not currently Target date: 04/05/2022 Goal status: INITIAL     PLAN: PT FREQUENCY: 1-2x/week for total of 12 visits over 10 week certification period    PT DURATION: 10 weeks   PLANNED INTERVENTIONS: Therapeutic exercises, Therapeutic activity, Neuromuscular re-education, Balance training, Gait training, Patient/Family education, Self Care, Joint mobilization, Joint manipulation, Stair training, Aquatic Therapy, Dry Needling, Electrical stimulation, Spinal manipulation, Spinal mobilization, Cryotherapy, Moist heat, Traction, Ultrasound, Ionotophoresis 4mg /ml Dexamethasone, and Manual therapy   PLAN FOR NEXT SESSION: upper rib expansion, lat and pec stretches, posture exercises,  chin tucks, scapular protraction,, breath work and rib/thoracic mobility   10:48 AM, 02/23/22 02/25/22, DPT Physical Therapy with Tere[facility] Coop

## 2022-02-28 NOTE — Progress Notes (Unsigned)
   I, Peterson Lombard, LAT, ATC acting as a scribe for Lynne Leader, MD.  Sierra Hernandez is a 31 y.o. female who presents to [place] at [facility] today for f/u polyarthralgia including anterior chest pain and upper extremity paresthesias or pain. Pt was last seen by Dr. Georgina Snell on 01/11/22 and was referred to PT, completing 5 visits. Today, pt reports  Dx testing: 8/8 & 4/25 Labs 09/16/21 Chest & C-spine XR  Pertinent review of systems: ***  Relevant historical information: ***   Exam:  There were no vitals taken for this visit. General: Well Developed, well nourished, and in no acute distress.   MSK: ***    Lab and Radiology Results No results found for this or any previous visit (from the past 72 hour(s)). No results found.     Assessment and Plan: 31 y.o. female with ***   PDMP not reviewed this encounter. No orders of the defined types were placed in this encounter.  No orders of the defined types were placed in this encounter.    Discussed warning signs or symptoms. Please see discharge instructions. Patient expresses understanding.   ***

## 2022-03-01 ENCOUNTER — Ambulatory Visit (INDEPENDENT_AMBULATORY_CARE_PROVIDER_SITE_OTHER): Payer: Managed Care, Other (non HMO) | Admitting: Family Medicine

## 2022-03-01 VITALS — Wt 138.0 lb

## 2022-03-01 DIAGNOSIS — R0789 Other chest pain: Secondary | ICD-10-CM | POA: Diagnosis not present

## 2022-03-01 DIAGNOSIS — M5412 Radiculopathy, cervical region: Secondary | ICD-10-CM

## 2022-03-01 NOTE — Patient Instructions (Signed)
Thank you for coming in today.   Continue PT and home exercises.   Recheck as needed.   Let me know if you need anything.

## 2022-03-02 ENCOUNTER — Encounter: Payer: Managed Care, Other (non HMO) | Admitting: Physical Therapy

## 2022-03-09 ENCOUNTER — Ambulatory Visit: Payer: Managed Care, Other (non HMO) | Admitting: Physical Therapy

## 2022-03-09 ENCOUNTER — Encounter: Payer: Self-pay | Admitting: Physical Therapy

## 2022-03-09 DIAGNOSIS — M542 Cervicalgia: Secondary | ICD-10-CM

## 2022-03-09 DIAGNOSIS — M6281 Muscle weakness (generalized): Secondary | ICD-10-CM

## 2022-03-09 NOTE — Therapy (Signed)
OUTPATIENT PHYSICAL THERAPY TREATMENT NOTE   Patient Name: Sierra Hernandez MRN: [id number] DOB:1990/06/20, 31 y.o., female Today's Date: 03/09/2022  PCP: Tawnya Crook, MD   REFERRING PROVIDER: Gregor Hams, MD   END OF SESSION:   PT End of Session - 03/09/22 0843     Visit Number 6    Number of Visits 12    Date for PT Re-Evaluation 04/05/22    Authorization Type cigna    PT Start Time [phone]    PT Stop Time 0930    PT Time Calculation (min) 37 min    Activity Tolerance Patient tolerated treatment well    Behavior During Therapy [facility] for tasks assessed/performed              Past Medical History:  Diagnosis Date   Allergy    Asthma    Elevated LFTs    Gallstones    IBS (irritable bowel syndrome)    Pancreatitis 01/2017   Scoliosis    Past Surgical History:  Procedure Laterality Date   CHOLECYSTECTOMY N/A 02/05/2017   Procedure: LAPAROSCOPIC CHOLECYSTECTOMY WITH INTRAOPERATIVE CHOLANGIOGRAM;  Surgeon: Erroll Luna, MD;  Location: [place];  Service: General;  Laterality: N/A;   ERCP N/A 02/06/2017   Procedure: ENDOSCOPIC RETROGRADE CHOLANGIOPANCREATOGRAPHY (ERCP);  Surgeon: Doran Stabler, MD;  Location: [place];  Service: Gastroenterology;  Laterality: N/A;   MOLE REMOVAL     x 2 on back   Patient Active Problem List   Diagnosis Date Noted   Epigastric pain 12/20/2018   Intraabdominal fluid collection    Abnormal liver function tests    Protein-calorie malnutrition, severe 02/14/2017   Abdominal distention    Abdominal fluid collection    Acute pancreatitis 02/08/2017   Gallstones    Elevated LFTs      REFERRING DIAG: R07.89 (ICD-10-CM) - Other chest pain M54.12 (ICD-10-CM) - Cervical radiculitis  THERAPY DIAG:  Cervicalgia  Muscle weakness (generalized)  Rationale for Evaluation and Treatment Rehabilitation  PERTINENT HISTORY: Hx of gall bladder removal   PRECAUTIONS: None   SUBJECTIVE: States that she went to a hot yoga class  on Friday and she has been feeling pain in her right arm and it feels like it locks up in the joint and it hurts with certain motions like putting a dish in the sink and with making her bed. Not currently hurting. States that felt her chest afterwards.   EVAL: States that she has pain across her chest and into her arm and it happens on both sides but L>R. States that she goes to body pump and piliates once a week. States that the pain would come about a week after and she felt deep in her chest pain. States that she has stopped going to her classes for the last 2 months and the pain has resolved mostly.  States that occasionally she reaches overhead into her cabinets she feels it.  PAIN:  Are you having pain? no: NPRS scale: 0/10 Pain location: deep in the chest  under her breast Pain description: sharp, short lived Aggravating factors: body pump classes Relieving factors: rest OBJECTIVE:    DIAGNOSTIC FINDINGS:  Xray of chest and neck normal     COGNITION: Overall cognitive status: Within functional limits for tasks assessed     POSTURE: rounded shoulders, forward head, increased thoracic kyphosis, and flexed trunk    PALPATION: Tenderness to palpation along intercostals L>R anterior. Hypomobility in PA to ribs with reproduction of symptoms ribs 3-5  CERVICAL ROM:    Active ROM A/PROM (deg) eval  Flexion 40 (tightness)  Extension WFL (tightness)  Right lateral flexion 20 (tightness)  Left lateral flexion 20 (tightness)  Right rotation 60  Left rotation 45   (Blank rows = not tested)                UE Measurements       Upper Extremity Right 01/25/2022 Left 01/25/2022    A/PROM MMT A/PROM MMT  Shoulder Flexion 165 4* 160** 4  Shoulder Extension          Shoulder Abduction   4-*   4  Shoulder Adduction          Shoulder Internal Rotation Reaches to base of right shoulder blade 3+ Reaches to T4SP 3+  Shoulder External Rotation Reaches to T4SP 3+ Reaches to T4SP 3+   Elbow Flexion          Elbow Extension          Wrist Flexion          Wrist Extension          Wrist Supination          Wrist Pronation          Wrist Ulnar Deviation          Wrist Radial Deviation          Grip Strength NA   NA                          (Blank rows = not tested)                       * pain                       **tightness Right handed     CERVICAL SPECIAL TESTS:  Neg Spurling + R median neural tension Limited pec and lat length bilaterally      TODAY'S TREATMENT:  03/09/2022  Therapeutic Exercise:            Aerobic: Supine:   Prone:            Seated:             Standing: shoulder flexion up wall with pillow case for ER/protraction - 5 minutes - tolerated well,  review of HEP Neuromuscular Re-education: hip hinge seated 10 minutes, hip hinge standing 15 minutes, seated posture education 6 minutes Manual Therapy:   Therapeutic Activity: Self Care: Trigger Point Dry Needling:  Modalities:          PATIENT EDUCATION:  Education details: on HEP, on anatomy, on piliates/yoga and swimming and pros/cons of different things based on current presentation Person educated: Patient Education method: Explanation, Demonstration, and Handouts Education comprehension: verbalized understanding   HOME EXERCISE PROGRAM: Access Code: [email or web address]    ASSESSMENT:   CLINICAL IMPRESSION: Overall patient is doing well. Focused on posture and movement retraining today. Fatigue noted and mild back pain but this reduced with focus on anterior pelvic tilt. Improved muscle activation noted. Reducing to 1x every other week secondary to progress. Will continue with current POC as tolerated.     OBJECTIVE IMPAIRMENTS decreased activity tolerance, decreased ROM, decreased strength, postural dysfunction, and pain.    ACTIVITY LIMITATIONS lifting   PARTICIPATION LIMITATIONS:  body pump classes   PERSONAL FACTORS Fitness are also affecting patient's functional outcome.  REHAB POTENTIAL: Excellent   CLINICAL DECISION MAKING: Stable/uncomplicated   EVALUATION COMPLEXITY: Low     GOALS: Goals reviewed with patient?  yes   SHORT TERM GOALS:   Patient will be independent in self management strategies to improve quality of life and functional outcomes. Baseline: new program Target date: 03/01/2022 Goal status: INITIAL   2.  Patient will report at least 50% improvement in overall symptoms and/or function to demonstrate improved functional mobility Baseline: 0% Target date: 03/01/2022 Goal status: INITIAL   3.  Patient will be able to reach overhead and place dishes in overhead  shelf without pain to improve overall function Baseline: painful Target date: 03/01/2022 Goal status: INITIAL         LONG TERM GOALS:   Patient will report at least 75% improvement in overall symptoms and/or function to demonstrate improved functional mobility Baseline: 0% Target date: 04/05/2022 Goal status: INITIAL   2.  Patient will demonstrate at least 4/5 MMT in B UE to demonstrate improved strength in arms Baseline: see above Target date: 04/05/2022 Goal status: INITIAL   3.  Patient will report sitting in more active posture throughout the day to reduce stress on posterior chain Baseline: not currently Target date: 04/05/2022 Goal status: INITIAL     PLAN: PT FREQUENCY: 1-2x/week for total of 12 visits over 10 week certification period    PT DURATION: 10 weeks   PLANNED INTERVENTIONS: Therapeutic exercises, Therapeutic activity, Neuromuscular re-education, Balance training, Gait training, Patient/Family education, Self Care, Joint mobilization, Joint manipulation, Stair training, Aquatic Therapy, Dry Needling, Electrical stimulation, Spinal manipulation, Spinal mobilization, Cryotherapy, Moist heat, Traction, Ultrasound, Ionotophoresis 4mg /ml Dexamethasone, and Manual therapy   PLAN FOR NEXT SESSION: hip hinge, reach, pelvic tilts anterior, upper rib  expansion, lat and pec stretches, posture exercises, chin tucks, scapular protraction,, breath work and rib/thoracic mobility   9:56 AM, 03/09/22 05/09/22, DPT Physical Therapy with Tere[facility] Coop

## 2022-03-16 ENCOUNTER — Encounter: Payer: Managed Care, Other (non HMO) | Admitting: Physical Therapy

## 2022-03-23 ENCOUNTER — Encounter: Payer: Self-pay | Admitting: Physical Therapy

## 2022-03-23 ENCOUNTER — Ambulatory Visit: Payer: Managed Care, Other (non HMO) | Admitting: Physical Therapy

## 2022-03-23 DIAGNOSIS — M6281 Muscle weakness (generalized): Secondary | ICD-10-CM

## 2022-03-23 DIAGNOSIS — M542 Cervicalgia: Secondary | ICD-10-CM

## 2022-03-23 NOTE — Therapy (Signed)
OUTPATIENT PHYSICAL THERAPY TREATMENT NOTE   Patient Name: Sierra Hernandez MRN: [id number] DOB:[birth year], 31 y.o., female Today's Date: 03/23/2022  PCP: Jeani Sow, MD   REFERRING PROVIDER: Rodolph Bong, MD   END OF SESSION:   PT End of Session - 03/23/22 0848     Visit Number 7    Number of Visits 12    Date for PT Re-Evaluation 04/05/22    Authorization Type cigna    PT Start Time [phone]    PT Stop Time 0930    PT Time Calculation (min) 38 min    Activity Tolerance Patient tolerated treatment well    Behavior During Therapy [facility] for tasks assessed/performed              Past Medical History:  Diagnosis Date   Allergy    Asthma    Elevated LFTs    Gallstones    IBS (irritable bowel syndrome)    Pancreatitis 01/2017   Scoliosis    Past Surgical History:  Procedure Laterality Date   CHOLECYSTECTOMY N/A 02/05/2017   Procedure: LAPAROSCOPIC CHOLECYSTECTOMY WITH INTRAOPERATIVE CHOLANGIOGRAM;  Surgeon: Harriette Bouillon, MD;  Location: [facility];  Service: General;  Laterality: N/A;   ERCP N/A 02/06/2017   Procedure: ENDOSCOPIC RETROGRADE CHOLANGIOPANCREATOGRAPHY (ERCP);  Surgeon: Sherrilyn Rist, MD;  Location: [facility];  Service: Gastroenterology;  Laterality: N/A;   MOLE REMOVAL     x 2 on back   Patient Active Problem List   Diagnosis Date Noted   Epigastric pain 12/20/2018   Intraabdominal fluid collection    Abnormal liver function tests    Protein-calorie malnutrition, severe 02/14/2017   Abdominal distention    Abdominal fluid collection    Acute pancreatitis 02/08/2017   Gallstones    Elevated LFTs      REFERRING DIAG: R07.89 (ICD-10-CM) - Other chest pain M54.12 (ICD-10-CM) - Cervical radiculitis  THERAPY DIAG:  Cervicalgia  Muscle weakness (generalized)  Rationale for Evaluation and Treatment Rehabilitation  PERTINENT HISTORY: Hx of gall bladder removal   PRECAUTIONS: None   SUBJECTIVE: States that she got both the covid and  flu shots in her left arm and she has been having pain in her left chest and it was pretty superficial. This was over a week ago and the pain lasted over a week. [place].   EVAL: States that she has pain across her chest and into her arm and it happens on both sides but L>R. States that she goes to body pump and piliates once a week. States that the pain would come about a week after and she felt deep in her chest pain. States that she has stopped going to her classes for the last 2 months and the pain has resolved mostly.  States that occasionally she reaches overhead into her cabinets she feels it.  PAIN:  Are you having pain? no: NPRS scale: 0/10 Pain location: deep in the chest  under her breast Pain description: sharp, short lived Aggravating factors: body pump classes Relieving factors: rest OBJECTIVE:    DIAGNOSTIC FINDINGS:  Xray of chest and neck normal     COGNITION: Overall cognitive status: Within functional limits for tasks assessed     POSTURE: rounded shoulders, forward head, increased thoracic kyphosis, and flexed trunk    PALPATION: Tenderness to palpation along intercostals L>R anterior. Hypomobility in PA to ribs with reproduction of symptoms ribs 3-5       CERVICAL  ROM:    Active ROM A/PROM (deg) eval  Flexion 40 (tightness)  Extension WFL (tightness)  Right lateral flexion 20 (tightness)  Left lateral flexion 20 (tightness)  Right rotation 60  Left rotation 45   (Blank rows = not tested)                UE Measurements       Upper Extremity Right 01/25/2022 Left 01/25/2022    A/PROM MMT A/PROM MMT  Shoulder Flexion 165 4* 160** 4  Shoulder Extension          Shoulder Abduction   4-*   4  Shoulder Adduction          Shoulder Internal Rotation Reaches to base of right shoulder blade 3+ Reaches to T4SP 3+  Shoulder External Rotation Reaches to T4SP 3+ Reaches to T4SP 3+  Elbow Flexion           Elbow Extension          Wrist Flexion          Wrist Extension          Wrist Supination          Wrist Pronation          Wrist Ulnar Deviation          Wrist Radial Deviation          Grip Strength NA   NA                          (Blank rows = not tested)                       * pain                       **tightness Right handed     CERVICAL SPECIAL TESTS:  Neg Spurling + R median neural tension Limited pec and lat length bilaterally      TODAY'S TREATMENT:  03/23/2022  Therapeutic Exercise:            Aerobic: Supine:   Prone:            Seated:             Standing: shoulder flexion up wall with pillow case for ER/protraction - 1 minute x2 - tolerated well,  review of HEP, review of HEP - all exercises Neuromuscular Re-education: prone breathing with scapular protraction - 5 minutes, plank with knee flexion (cheetah) x5 5-10" holds B, standing back at wall - pelvic tilts - 10 minutes -focus on posterior pelvic tilt Manual Therapy:   Therapeutic Activity: Self Care: Trigger Point Dry Needling:  Modalities:          PATIENT EDUCATION:  Education details: on HEP, on posture, on POC Person educated: Patient Education method: Explanation, Demonstration, and Handouts Education comprehension: verbalized understanding   HOME EXERCISE PROGRAM: Access Code: [email or web address]    ASSESSMENT:   CLINICAL IMPRESSION: Continued to focus on posture and pelvic position. Educated patient on how she favors anterior pelvic tilt, able to correct this with posterior support at wall. Added scapular push up+ and knee flexion to program and improved posture noted in this position. Instructed patient to perform 5 of these ever day until next session. Will continue with current POC as tolerated.      OBJECTIVE IMPAIRMENTS decreased activity tolerance, decreased ROM, decreased strength, postural dysfunction, and pain.  ACTIVITY LIMITATIONS lifting   PARTICIPATION LIMITATIONS:  body  pump classes   PERSONAL FACTORS Fitness are also affecting patient's functional outcome.    REHAB POTENTIAL: Excellent   CLINICAL DECISION MAKING: Stable/uncomplicated   EVALUATION COMPLEXITY: Low     GOALS: Goals reviewed with patient?  yes   SHORT TERM GOALS:   Patient will be independent in self management strategies to improve quality of life and functional outcomes. Baseline: new program Target date: 03/01/2022 Goal status: INITIAL   2.  Patient will report at least 50% improvement in overall symptoms and/or function to demonstrate improved functional mobility Baseline: 0% Target date: 03/01/2022 Goal status: INITIAL   3.  Patient will be able to reach overhead and place dishes in overhead  shelf without pain to improve overall function Baseline: painful Target date: 03/01/2022 Goal status: INITIAL         LONG TERM GOALS:   Patient will report at least 75% improvement in overall symptoms and/or function to demonstrate improved functional mobility Baseline: 0% Target date: 04/05/2022 Goal status: INITIAL   2.  Patient will demonstrate at least 4/5 MMT in B UE to demonstrate improved strength in arms Baseline: see above Target date: 04/05/2022 Goal status: INITIAL   3.  Patient will report sitting in more active posture throughout the day to reduce stress on posterior chain Baseline: not currently Target date: 04/05/2022 Goal status: INITIAL     PLAN: PT FREQUENCY: 1-2x/week for total of 12 visits over 10 week certification period    PT DURATION: 10 weeks   PLANNED INTERVENTIONS: Therapeutic exercises, Therapeutic activity, Neuromuscular re-education, Balance training, Gait training, Patient/Family education, Self Care, Joint mobilization, Joint manipulation, Stair training, Aquatic Therapy, Dry Needling, Electrical stimulation, Spinal manipulation, Spinal mobilization, Cryotherapy, Moist heat, Traction, Ultrasound, Ionotophoresis 4mg /ml Dexamethasone, and  Manual therapy   PLAN FOR NEXT SESSION: hip hinge, reach, pelvic tilts anterior, upper rib expansion, lat and pec stretches, posture exercises, chin tucks, scapular protraction,, breath work and rib/thoracic mobility   10:40 AM, 03/23/22 Jerene Pitch, DPT Physical Therapy with [facility]inner

## 2022-03-30 ENCOUNTER — Encounter: Payer: Managed Care, Other (non HMO) | Admitting: Physical Therapy

## 2022-04-04 ENCOUNTER — Encounter: Payer: Self-pay | Admitting: Physical Therapy

## 2022-04-04 ENCOUNTER — Ambulatory Visit: Payer: Managed Care, Other (non HMO) | Admitting: Physical Therapy

## 2022-04-04 DIAGNOSIS — M542 Cervicalgia: Secondary | ICD-10-CM

## 2022-04-04 DIAGNOSIS — M6281 Muscle weakness (generalized): Secondary | ICD-10-CM | POA: Diagnosis not present

## 2022-04-04 NOTE — Therapy (Signed)
OUTPATIENT PHYSICAL THERAPY TREATMENT NOTE   Patient Name: Sierra Hernandez MRN: [id number] DOB:[birth year], 31 y.o., female Today's Date: 04/04/2022  PCP: Tawnya Crook, MD   REFERRING PROVIDER: Gregor Hams, MD   END OF SESSION:   PT End of Session - 04/04/22 0802     Visit Number 8    Number of Visits 12    Date for PT Re-Evaluation 04/05/22    Authorization Type cigna    PT Start Time 0803    PT Stop Time 0845    PT Time Calculation (min) 42 min    Activity Tolerance Patient tolerated treatment well    Behavior During Therapy [facility] for tasks assessed/performed              Past Medical History:  Diagnosis Date   Allergy    Asthma    Elevated LFTs    Gallstones    IBS (irritable bowel syndrome)    Pancreatitis 01/2017   Scoliosis    Past Surgical History:  Procedure Laterality Date   CHOLECYSTECTOMY N/A 02/05/2017   Procedure: LAPAROSCOPIC CHOLECYSTECTOMY WITH INTRAOPERATIVE CHOLANGIOGRAM;  Surgeon: Erroll Luna, MD;  Location: [place];  Service: General;  Laterality: N/A;   ERCP N/A 02/06/2017   Procedure: ENDOSCOPIC RETROGRADE CHOLANGIOPANCREATOGRAPHY (ERCP);  Surgeon: Doran Stabler, MD;  Location: [place];  Service: Gastroenterology;  Laterality: N/A;   MOLE REMOVAL     x 2 on back   Patient Active Problem List   Diagnosis Date Noted   Epigastric pain 12/20/2018   Intraabdominal fluid collection    Abnormal liver function tests    Protein-calorie malnutrition, severe 02/14/2017   Abdominal distention    Abdominal fluid collection    Acute pancreatitis 02/08/2017   Gallstones    Elevated LFTs      REFERRING DIAG: R07.89 (ICD-10-CM) - Other chest pain M54.12 (ICD-10-CM) - Cervical radiculitis  THERAPY DIAG:  Cervicalgia  Muscle weakness (generalized)  Rationale for Evaluation and Treatment Rehabilitation  PERTINENT HISTORY: Hx of gall bladder removal   PRECAUTIONS: None   SUBJECTIVE: Overall good but had some lateral left  cramping in yoga on Sunday when she was laying down.  EVAL: States that she has pain across her chest and into her arm and it happens on both sides but L>R. States that she goes to body pump and piliates once a week. States that the pain would come about a week after and she felt deep in her chest pain. States that she has stopped going to her classes for the last 2 months and the pain has resolved mostly.  States that occasionally she reaches overhead into her cabinets she feels it.  PAIN:  Are you having pain? no: NPRS scale: 0/10 Pain location: deep in the chest  under her breast Pain description: sharp, short lived Aggravating factors: body pump classes Relieving factors: rest OBJECTIVE:    DIAGNOSTIC FINDINGS:  Xray of chest and neck normal     COGNITION: Overall cognitive status: Within functional limits for tasks assessed     POSTURE: rounded shoulders, forward head, increased thoracic kyphosis, and flexed trunk    PALPATION: Tenderness to palpation along intercostals L>R anterior. Hypomobility in PA to ribs with reproduction of symptoms ribs 3-5       CERVICAL ROM:    Active ROM A/PROM (deg) eval  Flexion 40 (tightness)  Extension WFL (tightness)  Right lateral flexion 20 (tightness)  Left lateral flexion 20 (tightness)  Right rotation 60  Left rotation 45   (  Blank rows = not tested)                UE Measurements       Upper Extremity Right 01/25/2022 Left 01/25/2022    A/PROM MMT A/PROM MMT  Shoulder Flexion 165 4* 160** 4  Shoulder Extension          Shoulder Abduction   4-*   4  Shoulder Adduction          Shoulder Internal Rotation Reaches to base of right shoulder blade 3+ Reaches to T4SP 3+  Shoulder External Rotation Reaches to T4SP 3+ Reaches to T4SP 3+  Elbow Flexion          Elbow Extension          Wrist Flexion          Wrist Extension          Wrist Supination          Wrist Pronation          Wrist Ulnar Deviation          Wrist Radial  Deviation          Grip Strength NA   NA                          (Blank rows = not tested)                       * pain                       **tightness Right handed     CERVICAL SPECIAL TESTS:  Neg Spurling + R median neural tension Limited pec and lat length bilaterally      TODAY'S TREATMENT:  04/04/2022 Therapeutic Exercise:            Aerobic: Supine:  shoulder flexion breathing into upper ribs x15 B Prone: cheetah x5 review, shortened plank at wall 3x5             Seated:             Standing:  Neuromuscular Re-education: back to wall with scapular protraction and knees drawn to chest x15 10" holds with breath work, prone  scapular retraction depression rhythmic initiation (PROM/AAROM/AROM) - 10 minutes - tolerated well  Manual Therapy:  STM to rhomboids and pec, rib mobilizations PA grade II - tolerated well  Therapeutic Activity: Self Care: Trigger Point Dry Needling:  Modalities:          PATIENT EDUCATION:  Education details: on HEP, Person educated: Patient Education method: Programmer, multimedia, Facilities manager, and Handouts Education comprehension: verbalized understanding   HOME EXERCISE PROGRAM: Access Code: [email or web address]    ASSESSMENT:   CLINICAL IMPRESSION: 04/04/2022 Tolerated session well but continues to be very limited in thoracic mobility and scapular mobility. Session focused on stretching and activating scapular region. This improved after manual and neuro re-ed but still limited. Added scapular mobility drills to HEP.   Eval: Continued to focus on posture and pelvic position. Educated patient on how she favors anterior pelvic tilt, able to correct this with posterior support at wall. Added scapular push up+ and knee flexion to program and improved posture noted in this position. Instructed patient to perform 5 of these ever day until next session. Will continue with current POC as tolerated.      OBJECTIVE IMPAIRMENTS decreased activity tolerance, decreased  ROM, decreased strength, postural  dysfunction, and pain.    ACTIVITY LIMITATIONS lifting   PARTICIPATION LIMITATIONS:  body pump classes   PERSONAL FACTORS Fitness are also affecting patient's functional outcome.    REHAB POTENTIAL: Excellent   CLINICAL DECISION MAKING: Stable/uncomplicated   EVALUATION COMPLEXITY: Low     GOALS: Goals reviewed with patient?  yes   SHORT TERM GOALS:   Patient will be independent in self management strategies to improve quality of life and functional outcomes. Baseline: new program Target date: 03/01/2022 Goal status: INITIAL   2.  Patient will report at least 50% improvement in overall symptoms and/or function to demonstrate improved functional mobility Baseline: 0% Target date: 03/01/2022 Goal status: INITIAL   3.  Patient will be able to reach overhead and place dishes in overhead  shelf without pain to improve overall function Baseline: painful Target date: 03/01/2022 Goal status: INITIAL         LONG TERM GOALS:   Patient will report at least 75% improvement in overall symptoms and/or function to demonstrate improved functional mobility Baseline: 0% Target date: 04/05/2022 Goal status: INITIAL   2.  Patient will demonstrate at least 4/5 MMT in B UE to demonstrate improved strength in arms Baseline: see above Target date: 04/05/2022 Goal status: INITIAL   3.  Patient will report sitting in more active posture throughout the day to reduce stress on posterior chain Baseline: not currently Target date: 04/05/2022 Goal status: INITIAL     PLAN: PT FREQUENCY: 1-2x/week for total of 12 visits over 10 week certification period    PT DURATION: 10 weeks   PLANNED INTERVENTIONS: Therapeutic exercises, Therapeutic activity, Neuromuscular re-education, Balance training, Gait training, Patient/Family education, Self Care, Joint mobilization, Joint manipulation, Stair training, Aquatic Therapy, Dry Needling, Electrical stimulation, Spinal  manipulation, Spinal mobilization, Cryotherapy, Moist heat, Traction, Ultrasound, Ionotophoresis 4mg /ml Dexamethasone, and Manual therapy   PLAN FOR NEXT SESSION: scapular mobility and thoracic mobility - reaching behind back  hip hinge, reach, pelvic tilts anterior, upper rib expansion, lat and pec stretches, posture exercises, chin tucks, scapular protraction,, breath work and rib/thoracic mobility   2:14 PM, 04/04/22 13/07/23, DPT Physical Therapy with Tereasa Coop

## 2022-04-25 ENCOUNTER — Encounter: Payer: Self-pay | Admitting: Physical Therapy

## 2022-04-25 ENCOUNTER — Ambulatory Visit (INDEPENDENT_AMBULATORY_CARE_PROVIDER_SITE_OTHER): Payer: Managed Care, Other (non HMO) | Admitting: Physical Therapy

## 2022-04-25 DIAGNOSIS — M542 Cervicalgia: Secondary | ICD-10-CM

## 2022-04-25 DIAGNOSIS — M6281 Muscle weakness (generalized): Secondary | ICD-10-CM

## 2022-04-25 NOTE — Therapy (Signed)
OUTPATIENT PHYSICAL THERAPY TREATMENT NOTE  Progress Note Reporting Period 01/25/22 to 04/25/22  See note below for Objective Data and Assessment of Progress/Goals.      Patient Name: Sierra Hernandez MRN: [id number] DOB:May 27, 1991, 31 y.o., female Today's Date: 04/25/2022  PCP: Tawnya Crook, MD   REFERRING PROVIDER: Gregor Hams, MD   END OF SESSION:   PT End of Session - 04/25/22 0801     Visit Number 9    Number of Visits 17    Date for PT Re-Evaluation 07/18/22    Authorization Type cigna    PT Start Time 0802    PT Stop Time 0845    PT Time Calculation (min) 43 min    Activity Tolerance Patient tolerated treatment well    Behavior During Therapy [facility] for tasks assessed/performed              Past Medical History:  Diagnosis Date   Allergy    Asthma    Elevated LFTs    Gallstones    IBS (irritable bowel syndrome)    Pancreatitis 01/2017   Scoliosis    Past Surgical History:  Procedure Laterality Date   CHOLECYSTECTOMY N/A 02/05/2017   Procedure: LAPAROSCOPIC CHOLECYSTECTOMY WITH INTRAOPERATIVE CHOLANGIOGRAM;  Surgeon: Erroll Luna, MD;  Location: [place];  Service: General;  Laterality: N/A;   ERCP N/A 02/06/2017   Procedure: ENDOSCOPIC RETROGRADE CHOLANGIOPANCREATOGRAPHY (ERCP);  Surgeon: Doran Stabler, MD;  Location: [place];  Service: Gastroenterology;  Laterality: N/A;   MOLE REMOVAL     x 2 on back   Patient Active Problem List   Diagnosis Date Noted   Epigastric pain 12/20/2018   Intraabdominal fluid collection    Abnormal liver function tests    Protein-calorie malnutrition, severe 02/14/2017   Abdominal distention    Abdominal fluid collection    Acute pancreatitis 02/08/2017   Gallstones    Elevated LFTs      REFERRING DIAG: R07.89 (ICD-10-CM) - Other chest pain M54.12 (ICD-10-CM) - Cervical radiculitis  THERAPY DIAG:  Cervicalgia  Muscle weakness (generalized)  Rationale for Evaluation and Treatment  Rehabilitation  PERTINENT HISTORY: Hx of gall bladder removal   PRECAUTIONS: None   SUBJECTIVE: States she has been busy so she hasn't been doing much.States she tried to do her exercises but it was difficult with the holidays. States she hasn't gone back to her intense exercises to difficult to truly tell progress but overall would say 60% better. The intensity and duration of the pain is less but still present with certain activities.   EVAL: States that she has pain across her chest and into her arm and it happens on both sides but L>R. States that she goes to body pump and piliates once a week. States that the pain would come about a week after and she felt deep in her chest pain. States that she has stopped going to her classes for the last 2 months and the pain has resolved mostly.  States that occasionally she reaches overhead into her cabinets she feels it.  PAIN:  Are you having pain? no: NPRS scale: 0/10 Pain location: deep in the chest  under her breast Pain description: sharp, short lived Aggravating factors: body pump classes Relieving factors: rest OBJECTIVE:    DIAGNOSTIC FINDINGS:  Xray of chest and neck normal     COGNITION: Overall cognitive status: Within functional limits for tasks assessed     POSTURE: rounded shoulders, forward head, increased thoracic kyphosis, and flexed trunk  PALPATION: Tenderness to palpation along intercostals L>R anterior. Hypomobility in PA to ribs with reproduction of symptoms ribs 3-5       CERVICAL ROM:    Active ROM A/PROM (deg) 04/25/22  Flexion 40   Extension WFL   Right lateral flexion 20 (tightness)  Left lateral flexion 20 (tightness)  Right rotation 60  Left rotation 45   (Blank rows = not tested)                UE Measurements       Upper Extremity Right 04/25/2022 Left 04/25/2022    A/PROM MMT A/PROM MMT  Shoulder Flexion 160 4+* 160 4+  Shoulder Extension          Shoulder Abduction   4+*   4+*   Shoulder Adduction          Shoulder Internal Rotation Reaches to T6 SP 3+ Reaches to T4SP 3+  Shoulder External Rotation Reaches to T4SP 4- Reaches to T4SP 4-  Elbow Flexion          Elbow Extension          Wrist Flexion          Wrist Extension          Wrist Supination          Wrist Pronation          Wrist Ulnar Deviation          Wrist Radial Deviation          Grip Strength NA   NA                          (Blank rows = not tested)                       * pain                       **tightness Right handed     CERVICAL SPECIAL TESTS:  Neg Spurling + R median neural tension Limited pec and lat length bilaterally      TODAY'S TREATMENT:  04/25/2022 Therapeutic Exercise:            Aerobic: Supine:    Prone: cheetah x5 review, shortened plank at wall x5 5" holds            Seated:             Standing: shoulder ER/IR with red band - anchored to side - x15 B and each - verbal and tactile cues Neuromuscular Re-education:   Manual Therapy:    Therapeutic Activity: Self Care: Trigger Point Dry Needling:  Modalities:          PATIENT EDUCATION:  Education details: reviewed entire HEP, on progress made, on POC Person educated: Patient Education method: Explanation, Demonstration, and Handouts Education comprehension: verbalized understanding   HOME EXERCISE PROGRAM: Access Code: [email or web address]    ASSESSMENT:   CLINICAL IMPRESSION: 04/25/2022 Session focused on review of HEP and answering all questions. Overall patient is progressing and demonstrating improved strength and reduced pain with movements. Continues to have limitations in function and ROM ad would continue to benefit skilled PT at this time. Extending POC to continue to work towards remaining goals.  Eval: Continued to focus on posture and pelvic position. Educated patient on how she favors anterior pelvic tilt, able to correct this with posterior support at wall. Added scapular  push up+ and knee  flexion to program and improved posture noted in this position. Instructed patient to perform 5 of these ever day until next session. Will continue with current POC as tolerated.      OBJECTIVE IMPAIRMENTS decreased activity tolerance, decreased ROM, decreased strength, postural dysfunction, and pain.    ACTIVITY LIMITATIONS lifting   PARTICIPATION LIMITATIONS:  body pump classes   PERSONAL FACTORS Fitness are also affecting patient's functional outcome.    REHAB POTENTIAL: Excellent   CLINICAL DECISION MAKING: Stable/uncomplicated   EVALUATION COMPLEXITY: Low     GOALS: Goals reviewed with patient?  yes   SHORT TERM GOALS:   Patient will be independent in self management strategies to improve quality of life and functional outcomes. Baseline: new program Target date: 03/01/2022 Goal status: MET    2.  Patient will report at least 50% improvement in overall symptoms and/or function to demonstrate improved functional mobility Baseline: 0% Target date: 03/01/2022 Goal status: MET   3.  Patient will be able to reach overhead and place dishes in overhead  shelf without pain to improve overall function Baseline: painful Target date: 03/01/2022 Goal status: MET         LONG TERM GOALS:   Patient will report at least 75% improvement in overall symptoms and/or function to demonstrate improved functional mobility Baseline: 0% Target date: 04/05/2022 Goal status: PROGRESSING   2.  Patient will demonstrate at least 4/5 MMT in B UE to demonstrate improved strength in arms Baseline: see above Target date: 04/05/2022 Goal status: PROGRESSING    3.  Patient will report sitting in more active posture throughout the day to reduce stress on posterior chain Baseline: not currently Target date: 04/05/2022 Goal status: MET     PLAN: PT FREQUENCY: 1-3x/month for total of  8 visits over 12 week certification period    PT DURATION: 10 weeks   PLANNED INTERVENTIONS: Therapeutic  exercises, Therapeutic activity, Neuromuscular re-education, Balance training, Gait training, Patient/Family education, Self Care, Joint mobilization, Joint manipulation, Stair training, Aquatic Therapy, Dry Needling, Electrical stimulation, Spinal manipulation, Spinal mobilization, Cryotherapy, Moist heat, Traction, Ultrasound, Ionotophoresis 14m/ml Dexamethasone, and Manual therapy   PLAN FOR NEXT SESSION: scapular protraction, shoulder MMT and ROM   8:53 AM, 04/25/22 MJerene Pitch DPT Physical Therapy with CRoyston Sinner

## 2022-05-11 ENCOUNTER — Ambulatory Visit (INDEPENDENT_AMBULATORY_CARE_PROVIDER_SITE_OTHER): Payer: Managed Care, Other (non HMO) | Admitting: Physical Therapy

## 2022-05-11 ENCOUNTER — Encounter: Payer: Self-pay | Admitting: Physical Therapy

## 2022-05-11 DIAGNOSIS — M6281 Muscle weakness (generalized): Secondary | ICD-10-CM | POA: Diagnosis not present

## 2022-05-11 DIAGNOSIS — M542 Cervicalgia: Secondary | ICD-10-CM | POA: Diagnosis not present

## 2022-05-11 NOTE — Therapy (Signed)
OUTPATIENT PHYSICAL THERAPY TREATMENT NOTE    Patient Name: Sierra Hernandez MRN: [id number] DOB:1991/04/08, 31 y.o., female Today's Date: 05/11/2022  PCP: Tawnya Crook, MD   REFERRING PROVIDER: Gregor Hams, MD   END OF SESSION:   PT End of Session - 05/11/22 0843     Visit Number 10    Number of Visits 17    Date for PT Re-Evaluation 07/18/22    Authorization Type cigna    PT Start Time [phone]    PT Stop Time 0927    PT Time Calculation (min) 40 min    Activity Tolerance Patient tolerated treatment well    Behavior During Therapy [facility] for tasks assessed/performed              Past Medical History:  Diagnosis Date   Allergy    Asthma    Elevated LFTs    Gallstones    IBS (irritable bowel syndrome)    Pancreatitis 01/2017   Scoliosis    Past Surgical History:  Procedure Laterality Date   CHOLECYSTECTOMY N/A 02/05/2017   Procedure: LAPAROSCOPIC CHOLECYSTECTOMY WITH INTRAOPERATIVE CHOLANGIOGRAM;  Surgeon: Erroll Luna, MD;  Location: [place];  Service: General;  Laterality: N/A;   ERCP N/A 02/06/2017   Procedure: ENDOSCOPIC RETROGRADE CHOLANGIOPANCREATOGRAPHY (ERCP);  Surgeon: Doran Stabler, MD;  Location: [place];  Service: Gastroenterology;  Laterality: N/A;   MOLE REMOVAL     x 2 on back   Patient Active Problem List   Diagnosis Date Noted   Epigastric pain 12/20/2018   Intraabdominal fluid collection    Abnormal liver function tests    Protein-calorie malnutrition, severe 02/14/2017   Abdominal distention    Abdominal fluid collection    Acute pancreatitis 02/08/2017   Gallstones    Elevated LFTs      REFERRING DIAG: R07.89 (ICD-10-CM) - Other chest pain M54.12 (ICD-10-CM) - Cervical radiculitis  THERAPY DIAG:  Cervicalgia  Muscle weakness (generalized)  Rationale for Evaluation and Treatment Rehabilitation  PERTINENT HISTORY: Hx of gall bladder removal   PRECAUTIONS: None   SUBJECTIVE:  States that she was doing some of the  band exercises and she can feel some irritation in her chest. States she doesn't know if she did something that caused her pain or if it was the exercise.   EVAL: States that she has pain across her chest and into her arm and it happens on both sides but L>R. States that she goes to body pump and piliates once a week. States that the pain would come about a week after and she felt deep in her chest pain. States that she has stopped going to her classes for the last 2 months and the pain has resolved mostly.  States that occasionally she reaches overhead into her cabinets she feels it.  PAIN:  Are you having pain? no: NPRS scale:2/10 Pain location: deep in the chest  under her breast Pain description: sharp, short lived Aggravating factors: body pump classes Relieving factors: rest OBJECTIVE:    DIAGNOSTIC FINDINGS:  Xray of chest and neck normal     COGNITION: Overall cognitive status: Within functional limits for tasks assessed     POSTURE: rounded shoulders, forward head, increased thoracic kyphosis, and flexed trunk    PALPATION: Tenderness to palpation along intercostals L>R anterior. Hypomobility in PA to ribs with reproduction of symptoms ribs 3-5       CERVICAL ROM:    Active ROM A/PROM (deg) 04/25/22  Flexion 40   Extension  WFL   Right lateral flexion 20 (tightness)  Left lateral flexion 20 (tightness)  Right rotation 60  Left rotation 45   (Blank rows = not tested)                UE Measurements       Upper Extremity Right 04/25/2022 Left 04/25/2022    A/PROM MMT A/PROM MMT  Shoulder Flexion 160 4+* 160 4+  Shoulder Extension          Shoulder Abduction   4+*   4+*  Shoulder Adduction          Shoulder Internal Rotation Reaches to T6 SP 3+ Reaches to T4SP 3+  Shoulder External Rotation Reaches to T4SP 4- Reaches to T4SP 4-  Elbow Flexion          Elbow Extension          Wrist Flexion          Wrist Extension          Wrist Supination          Wrist  Pronation          Wrist Ulnar Deviation          Wrist Radial Deviation          Grip Strength NA   NA                          (Blank rows = not tested)                       * pain                       **tightness Right handed     CERVICAL SPECIAL TESTS:  Neg Spurling + R median neural tension Limited pec and lat length bilaterally      TODAY'S TREATMENT:  05/11/2022 Therapeutic Exercise:            Aerobic: Supine:    Prone: lying with towel under forehead for chin tuck- with and without pillows under pelvis for posture, chin tucks and scap protraction - x15 10" holds  15 minutes            Seated:             Standing: shoulder ER/IR with red band reviewed  Neuromuscular Re-education:  lower TRA activation with lower core focus- 12 minutes, then with weighted position 10 minutes and focus on relaxing paraspinals Manual Therapy:    Therapeutic Activity: Self Care: Trigger Point Dry Needling:  Modalities:          PATIENT EDUCATION:  Education details: reviewed entire HEP and consolidated for daily program Person educated: Patient Education method: Explanation, Demonstration, and Handouts Education comprehension: verbalized understanding   HOME EXERCISE PROGRAM: Access Code: [email or web address]    ASSESSMENT:   CLINICAL IMPRESSION: 05/11/2022 Session focused on review of home exercise program and establishing daily mobility routine that is 10 minutes or less.  Discussed cycling through other exercises 2-3 times a week.  Continues to demonstrate significant swayback which is causing current pain symptoms.  Focused on lower core activation today which was tolerated well and improved posture and supported position noted.  Will continue with current POC as tolerated.       OBJECTIVE IMPAIRMENTS decreased activity tolerance, decreased ROM, decreased strength, postural dysfunction, and pain.    ACTIVITY LIMITATIONS lifting   PARTICIPATION LIMITATIONS:  body pump classes    PERSONAL FACTORS Fitness are also affecting patient's functional outcome.    REHAB POTENTIAL: Excellent   CLINICAL DECISION MAKING: Stable/uncomplicated   EVALUATION COMPLEXITY: Low     GOALS: Goals reviewed with patient?  yes   SHORT TERM GOALS:   Patient will be independent in self management strategies to improve quality of life and functional outcomes. Baseline: new program Target date: 03/01/2022 Goal status: MET    2.  Patient will report at least 50% improvement in overall symptoms and/or function to demonstrate improved functional mobility Baseline: 0% Target date: 03/01/2022 Goal status: MET   3.  Patient will be able to reach overhead and place dishes in overhead  shelf without pain to improve overall function Baseline: painful Target date: 03/01/2022 Goal status: MET         LONG TERM GOALS:   Patient will report at least 75% improvement in overall symptoms and/or function to demonstrate improved functional mobility Baseline: 0% Target date: 04/05/2022 Goal status: PROGRESSING   2.  Patient will demonstrate at least 4/5 MMT in B UE to demonstrate improved strength in arms Baseline: see above Target date: 04/05/2022 Goal status: PROGRESSING    3.  Patient will report sitting in more active posture throughout the day to reduce stress on posterior chain Baseline: not currently Target date: 04/05/2022 Goal status: MET     PLAN: PT FREQUENCY: 1-3x/month for total of  8 visits over 12 week certification period    PT DURATION: 10 weeks   PLANNED INTERVENTIONS: Therapeutic exercises, Therapeutic activity, Neuromuscular re-education, Balance training, Gait training, Patient/Family education, Self Care, Joint mobilization, Joint manipulation, Stair training, Aquatic Therapy, Dry Needling, Electrical stimulation, Spinal manipulation, Spinal mobilization, Cryotherapy, Moist heat, Traction, Ultrasound, Ionotophoresis 47m/ml Dexamethasone, and Manual therapy   PLAN  FOR NEXT SESSION: scapular protraction, shoulder MMT and ROM   9:33 AM, 05/11/22 MJerene Pitch DPT Physical Therapy with CRoyston Sinner

## 2022-06-20 ENCOUNTER — Encounter: Payer: Self-pay | Admitting: Physical Therapy

## 2022-06-20 ENCOUNTER — Ambulatory Visit (INDEPENDENT_AMBULATORY_CARE_PROVIDER_SITE_OTHER): Payer: Managed Care, Other (non HMO) | Admitting: Physical Therapy

## 2022-06-20 DIAGNOSIS — M6281 Muscle weakness (generalized): Secondary | ICD-10-CM

## 2022-06-20 DIAGNOSIS — M542 Cervicalgia: Secondary | ICD-10-CM

## 2022-06-20 NOTE — Therapy (Signed)
OUTPATIENT PHYSICAL THERAPY TREATMENT NOTE    Patient Name: Sierra Hernandez MRN: [id number] DOB:02-Jun-1990, 32 y.o., female Today's Date: 06/20/2022  PCP: Tawnya Crook, MD   REFERRING PROVIDER: Gregor Hams, MD   END OF SESSION:   PT End of Session - 06/20/22 0810     Visit Number 11    Number of Visits 17    Date for PT Re-Evaluation 07/18/22    Authorization Type cigna    PT Start Time 0808    PT Stop Time 0849    PT Time Calculation (min) 41 min    Activity Tolerance Patient tolerated treatment well    Behavior During Therapy [facility] for tasks assessed/performed              Past Medical History:  Diagnosis Date   Allergy    Asthma    Elevated LFTs    Gallstones    IBS (irritable bowel syndrome)    Pancreatitis 01/2017   Scoliosis    Past Surgical History:  Procedure Laterality Date   CHOLECYSTECTOMY N/A 02/05/2017   Procedure: LAPAROSCOPIC CHOLECYSTECTOMY WITH INTRAOPERATIVE CHOLANGIOGRAM;  Surgeon: Erroll Luna, MD;  Location: [place];  Service: General;  Laterality: N/A;   ERCP N/A 02/06/2017   Procedure: ENDOSCOPIC RETROGRADE CHOLANGIOPANCREATOGRAPHY (ERCP);  Surgeon: Doran Stabler, MD;  Location: [place];  Service: Gastroenterology;  Laterality: N/A;   MOLE REMOVAL     x 2 on back   Patient Active Problem List   Diagnosis Date Noted   Epigastric pain 12/20/2018   Intraabdominal fluid collection    Abnormal liver function tests    Protein-calorie malnutrition, severe 02/14/2017   Abdominal distention    Abdominal fluid collection    Acute pancreatitis 02/08/2017   Gallstones    Elevated LFTs      REFERRING DIAG: R07.89 (ICD-10-CM) - Other chest pain M54.12 (ICD-10-CM) - Cervical radiculitis  THERAPY DIAG:  Cervicalgia  Muscle weakness (generalized)  Rationale for Evaluation and Treatment Rehabilitation  PERTINENT HISTORY: Hx of gall bladder removal   PRECAUTIONS: None   SUBJECTIVE:  States that she is about to get a  headache. States when she carries things up and down stairs she still gets the deep pain in her chest,not as bad but it is still there.    EVAL: States that she has pain across her chest and into her arm and it happens on both sides but L>R. States that she goes to body pump and piliates once a week. States that the pain would come about a week after and she felt deep in her chest pain. States that she has stopped going to her classes for the last 2 months and the pain has resolved mostly.  States that occasionally she reaches overhead into her cabinets she feels it.  PAIN:  Are you having pain? no: NPRS scale:0/10 Pain location: deep in the chest  under her breast Pain description: sharp, short lived Aggravating factors: body pump classes Relieving factors: rest OBJECTIVE:    DIAGNOSTIC FINDINGS:  Xray of chest and neck normal     COGNITION: Overall cognitive status: Within functional limits for tasks assessed     POSTURE: rounded shoulders, forward head, increased thoracic kyphosis, and flexed trunk    PALPATION: Tenderness to palpation along intercostals L>R anterior. Hypomobility in PA to ribs with reproduction of symptoms ribs 3-5       CERVICAL ROM:    Active ROM A/PROM (deg) 04/25/22  Flexion 40   Extension [facility]  Right lateral flexion 20 (tightness)  Left lateral flexion 20 (tightness)  Right rotation 60  Left rotation 45   (Blank rows = not tested)                UE Measurements       Upper Extremity Right 04/25/2022 Left 04/25/2022    A/PROM MMT A/PROM MMT  Shoulder Flexion 160 4+* 160 4+  Shoulder Extension          Shoulder Abduction   4+*   4+*  Shoulder Adduction          Shoulder Internal Rotation Reaches to T6 SP 3+ Reaches to T4SP 3+  Shoulder External Rotation Reaches to T4SP 4- Reaches to T4SP 4-  Elbow Flexion          Elbow Extension          Wrist Flexion          Wrist Extension          Wrist Supination          Wrist Pronation           Wrist Ulnar Deviation          Wrist Radial Deviation          Grip Strength NA   NA                          (Blank rows = not tested)                       * pain                       **tightness Right handed     CERVICAL SPECIAL TESTS:  Neg Spurling + R median neural tension Limited pec and lat length bilaterally      TODAY'S TREATMENT:  06/20/2022 Therapeutic Exercise:            Aerobic: Supine:    Prone:              Seated:             Standing:    Neuromuscular Re-education:  lower TRA activation with shortened plank - tactile cues, and exercises, up/down stairs with forceful exhale,/reduced anterior pelvic tilt. Manual Therapy:   STM to thoracic paraspinals - tolerated well  Therapeutic Activity: Self Care: Trigger Point Dry Needling:  Modalities:          PATIENT EDUCATION:  Education details: reviewed entire HEP, on self mobilization with LAX ball or massager, on priming muscles by doing exercises during the day, on checking her posture and position throughout the day and using visual feedback (videos/mirror), on rationale for interventions, movement retraining Person educated: Patient Education method: Explanation, Demonstration, and Handouts Education comprehension: verbalized understanding   HOME EXERCISE PROGRAM: Access Code: [email or web address]    ASSESSMENT:   CLINICAL IMPRESSION: 06/20/2022 Session focused on education, postures and movement retraining. Tolerated this well but continues to hinge at lower thoracic with minimal lower core activation. Tolerated tactile cues and pillow support well. Educated in [facility] to thoracic paraspinals and tolerated STM to this area well today with improved movement patterns after. Will continue with movement retraining and seated postures.        OBJECTIVE IMPAIRMENTS decreased activity tolerance, decreased ROM, decreased strength, postural dysfunction, and pain.    ACTIVITY LIMITATIONS lifting   PARTICIPATION  LIMITATIONS:  body pump  classes   PERSONAL FACTORS Fitness are also affecting patient's functional outcome.    REHAB POTENTIAL: Excellent   CLINICAL DECISION MAKING: Stable/uncomplicated   EVALUATION COMPLEXITY: Low     GOALS: Goals reviewed with patient?  yes   SHORT TERM GOALS:   Patient will be independent in self management strategies to improve quality of life and functional outcomes. Baseline: new program Target date: 03/01/2022 Goal status: MET    2.  Patient will report at least 50% improvement in overall symptoms and/or function to demonstrate improved functional mobility Baseline: 0% Target date: 03/01/2022 Goal status: MET   3.  Patient will be able to reach overhead and place dishes in overhead  shelf without pain to improve overall function Baseline: painful Target date: 03/01/2022 Goal status: MET         LONG TERM GOALS:   Patient will report at least 75% improvement in overall symptoms and/or function to demonstrate improved functional mobility Baseline: 0% Target date: 04/05/2022 Goal status: PROGRESSING   2.  Patient will demonstrate at least 4/5 MMT in B UE to demonstrate improved strength in arms Baseline: see above Target date: 04/05/2022 Goal status: PROGRESSING    3.  Patient will report sitting in more active posture throughout the day to reduce stress on posterior chain Baseline: not currently Target date: 04/05/2022 Goal status: MET     PLAN: PT FREQUENCY: 1-3x/month for total of  8 visits over 12 week certification period    PT DURATION: 10 weeks   PLANNED INTERVENTIONS: Therapeutic exercises, Therapeutic activity, Neuromuscular re-education, Balance training, Gait training, Patient/Family education, Self Care, Joint mobilization, Joint manipulation, Stair training, Aquatic Therapy, Dry Needling, Electrical stimulation, Spinal manipulation, Spinal mobilization, Cryotherapy, Moist heat, Traction, Ultrasound, Ionotophoresis 4mg /ml  Dexamethasone, and Manual therapy   PLAN FOR NEXT SESSION: scapular protraction, shoulder MMT and ROM   9:46 AM, 06/20/22 06/22/22, DPT Physical Therapy with Tereasa Coop

## 2022-06-23 ENCOUNTER — Ambulatory Visit (INDEPENDENT_AMBULATORY_CARE_PROVIDER_SITE_OTHER): Payer: Managed Care, Other (non HMO) | Admitting: Family Medicine

## 2022-06-23 ENCOUNTER — Encounter: Payer: Self-pay | Admitting: Family Medicine

## 2022-06-23 VITALS — BP 90/60 | HR 80 | Temp 98.2°F | Ht 65.0 in | Wt 138.1 lb

## 2022-06-23 DIAGNOSIS — Z3041 Encounter for surveillance of contraceptive pills: Secondary | ICD-10-CM | POA: Diagnosis not present

## 2022-06-23 DIAGNOSIS — Z0000 Encounter for general adult medical examination without abnormal findings: Secondary | ICD-10-CM | POA: Diagnosis not present

## 2022-06-23 LAB — LIPID PANEL
Cholesterol: 175 mg/dL (ref 0–200)
HDL: 58.1 mg/dL (ref >39.00)
LDL Cholesterol: 101 mg/dL — ABNORMAL HIGH (ref 0–99)
NonHDL: 117.26
Total CHOL/HDL Ratio: 3
Triglycerides: 81 mg/dL (ref 0.0–149.0)
VLDL: 16.2 mg/dL (ref 0.0–40.0)

## 2022-06-23 LAB — COMPREHENSIVE METABOLIC PANEL
ALT: 26 U/L (ref 0–35)
AST: 14 U/L (ref 0–37)
Albumin: 4.4 g/dL (ref 3.5–5.2)
Alkaline Phosphatase: 46 U/L (ref 39–117)
BUN: 19 mg/dL (ref 6–23)
CO2: 26 mEq/L (ref 19–32)
Calcium: 9.5 mg/dL (ref 8.4–10.5)
Chloride: 104 mEq/L (ref 96–112)
Creatinine, Ser: 0.74 mg/dL (ref 0.40–1.20)
GFR: 108.79 mL/min (ref >60.00)
Glucose, Bld: 82 mg/dL (ref 70–99)
Potassium: 4.6 mEq/L (ref 3.5–5.1)
Sodium: 137 mEq/L (ref 135–145)
Total Bilirubin: 0.4 mg/dL (ref 0.2–1.2)
Total Protein: 7.1 g/dL (ref 6.0–8.3)

## 2022-06-23 LAB — CBC WITH DIFFERENTIAL/PLATELET
Basophils Absolute: 0 10*3/uL (ref 0.0–0.1)
Basophils Relative: 0.4 % (ref 0.0–3.0)
Eosinophils Absolute: 0.1 10*3/uL (ref 0.0–0.7)
Eosinophils Relative: 1 % (ref 0.0–5.0)
HCT: 39.4 % (ref 36.0–46.0)
Hemoglobin: 13.5 g/dL (ref 12.0–15.0)
Lymphocytes Relative: 31.7 % (ref 12.0–46.0)
Lymphs Abs: 2.2 10*3/uL (ref 0.7–4.0)
MCHC: 34.2 g/dL (ref 30.0–36.0)
MCV: 89.1 fl (ref 78.0–100.0)
Monocytes Absolute: 0.6 10*3/uL (ref 0.1–1.0)
Monocytes Relative: 8.7 % (ref 3.0–12.0)
Neutro Abs: 4.1 10*3/uL (ref 1.4–7.7)
Neutrophils Relative %: 58.2 % (ref 43.0–77.0)
Platelets: 314 10*3/uL (ref 150.0–400.0)
RBC: 4.43 Mil/uL (ref 3.87–5.11)
RDW: 12.9 % (ref 11.5–15.5)
WBC: 7 10*3/uL (ref 4.0–10.5)

## 2022-06-23 LAB — TSH: TSH: 2.38 u[IU]/mL (ref 0.35–5.50)

## 2022-06-23 MED ORDER — NORGESTIMATE-ETH ESTRADIOL 0.25-35 MG-MCG PO TABS: 1.0000 | ORAL_TABLET | Freq: Every day | ORAL | 4 refills | Status: DC

## 2022-06-23 NOTE — Progress Notes (Signed)
Phone [phone]   Subjective:   Patient is a 32 y.o. female presenting for annual physical.    Chief Complaint  Patient presents with   Annual Exam    CPE Not fasting    Annual-  See problem oriented charting- ROS- ROS: Gen: no fever, chills  Skin: no rash, itching. Occ pinkie toenails dig in. ENT: no ear pain, ear drainage, nasal congestion, rhinorrhea, sinus pressure, sore throat Eyes: no blurry vision, double vision Resp: no cough, wheeze,SOB CV: no CP, palpitations, LE edema,   still in PT for chest wall pain GI: no heartburn, n/v/d/c, abd pain.  IBS mixed.  GU: no dysuria, urgency, frequency, hematuria MSK: no joint pain, myalgias, back pain.  Calves can be tight.  Neuro: no dizziness,, weakness, vertigo.  Headache w/weather changes.  Psych: no depression, insomnia, SI .  Anxiety w/health  The following were reviewed and entered/updated in epic: Past Medical History:  Diagnosis Date   Allergy    Asthma    Elevated LFTs    Gallstones    IBS (irritable bowel syndrome)    Pancreatitis 01/2017   Scoliosis    Patient Active Problem List   Diagnosis Date Noted   Epigastric pain 12/20/2018   Intraabdominal fluid collection    Abnormal liver function tests    Protein-calorie malnutrition, severe 02/14/2017   Abdominal distention    Abdominal fluid collection    Acute pancreatitis 02/08/2017   Gallstones    Elevated LFTs    Past Surgical History:  Procedure Laterality Date   CHOLECYSTECTOMY N/A 02/05/2017   Procedure: LAPAROSCOPIC CHOLECYSTECTOMY WITH INTRAOPERATIVE CHOLANGIOGRAM;  Surgeon: Erroll Luna, MD;  Location: [facility];  Service: General;  Laterality: N/A;   ERCP N/A 02/06/2017   Procedure: ENDOSCOPIC RETROGRADE CHOLANGIOPANCREATOGRAPHY (ERCP);  Surgeon: Doran Stabler, MD;  Location: [facility];  Service: Gastroenterology;  Laterality: N/A;   MOLE REMOVAL     x 2 on back    Family History  Problem Relation Age of Onset   Cancer Mother     Melanoma Mother    Hyperlipidemia Father    Heart disease Maternal Grandmother    Arthritis Maternal Grandmother    Gallstones Maternal Grandfather    Arthritis Paternal Grandmother    Thyroid cancer Paternal Grandfather    Colon cancer Neg Hx    Stomach cancer Neg Hx    Pancreatic cancer Neg Hx    Liver disease Neg Hx     Medications- reviewed and updated Current Outpatient Medications  Medication Sig Dispense Refill   fexofenadine (ALLEGRA) 60 MG tablet Take 60 mg by mouth daily.     Multiple Vitamin (MULTIVITAMIN WITH MINERALS) TABS tablet Take 1 tablet by mouth daily.     naproxen sodium (ALEVE) 220 MG tablet Take 220-440 mg by mouth 2 (two) times daily as needed (pain/headache).      polyethylene glycol (MIRALAX / GLYCOLAX) packet Take 17 g by mouth daily as needed (constipation). Mix in 8 oz liquid and drink     tretinoin (RETIN-A) 0.025 % cream Apply 1 application topically at bedtime.     Triamcinolone Acetonide (NASACORT ALLERGY 24HR NA) Place into the nose daily.     norgestimate-ethinyl estradiol (ORTHO-CYCLEN) 0.25-35 MG-MCG tablet Take 1 tablet by mouth at bedtime. 84 tablet 4   No current facility-administered medications for this visit.    Allergies-reviewed and updated Allergies  Allergen Reactions   Amoxicillin-Pot Clavulanate Itching    Itching ? If related to complications with gallbladder or with  augmentin Itching ? If related to complications with gallbladder or with augmentin Itching ? If related to complications with gallbladder or with augmentin Itching ? If related to complications with gallbladder or with augmentin Itching ? If related to complications with gallbladder or with augmentin   Minocycline Swelling and Other (See Comments)    Joint pain and swelling, exhaustion Joint pain and swelling, exhaustion Other reaction(s): Other Joint pain and swelling, exhaustion Joint pain and swelling, exhaustion Joint pain and swelling, exhaustion     Social History   Social History Narrative   [employer]er[employer]   Objective  Objective:  BP 90/60   Pulse 80   Temp 98.2 F (36.8 C) (Temporal)   Ht 5\' 5"  (1.651 m)   Wt 138 lb 2 oz (62.7 kg)   LMP 06/16/2022 (Exact Date)   SpO2 98%   BMI 22.99 kg/m  Physical Exam  Gen: WDWN NAD HEENT: NCAT, conjunctiva not injected, sclera nonicteric TM WNL B, OP moist, no exudates  NECK:  supple, no thyromegaly, no nodes, no carotid bruits CARDIAC: RRR, S1S2+, no murmur. DP 2+B LUNGS: CTAB. No wheezes ABDOMEN:  BS+, soft, NTND, No HSM, no masses EXT:  no edema MSK: no gross abnormalities. MS 5/5 all 4 NEURO: A&O x3.  CN II-XII intact.  PSYCH: normal mood. Good eye contact      Assessment and Plan   Health Maintenance counseling: 1. Anticipatory guidance: Patient counseled regarding regular dental exams q6 months, eye exams,  avoiding smoking and second hand smoke, limiting alcohol to 1 beverage per day, no illicit drugs.   2. Risk factor reduction:  Advised patient of need for regular exercise and diet rich and fruits and vegetables to reduce risk of heart attack and stroke. Exercise- yes.  Wt Readings from Last 3 Encounters:  06/23/22 138 lb 2 oz (62.7 kg)  03/01/22 138 lb (62.6 kg)  01/11/22 136 lb 6.4 oz (61.9 kg)   3. Immunizations/screenings/ancillary studies Immunization History  Administered Date(s) Administered   HPV 9-valent 02/25/2007, 04/24/2007, 10/12/2007   Influenza Split 03/01/2009   Influenza, Quadrivalent, Recombinant, Inj, Pf 03/12/2020, 03/10/2022   Influenza, Seasonal, Injecte, Preservative Fre 03/08/2017, 03/15/2019   Influenza,inj,Quad PF,6+ Mos 04/01/2021   Influenza,trivalent, recombinat, inj, PF 03/01/2009   Influenza-Unspecified 03/08/2017, 03/15/2019, 03/26/2020   Meningococcal Conjugate 10/24/2005, 11/23/2010   Moderna Covid-19 Vaccine Bivalent Booster 49yrs & up 04/01/2021   Moderna Sars-Covid-2 Vaccination 03/10/2022   PFIZER(Purple  Top)SARS-COV-2 Vaccination 08/23/2019, 09/16/2019, 04/24/2020   Tdap 02/25/2007, 01/15/2017   There are no preventive care reminders to display for this patient.  4. Cervical cancer screening: utd 5. Skin cancer screening- advised regular sunscreen use. Denies worrisome, changing, or new skin lesions.  6. Birth control/STD check: ocd 7. Smoking associated screening: non smoker 8. Alcohol screening: ok  Problem List Items Addressed This Visit   None Visit Diagnoses     Wellness examination    -  Primary   Relevant Orders   Comprehensive metabolic panel   Lipid panel   TSH   CBC with Differential/Platelet   Encounter for birth control pills maintenance       Relevant Medications   norgestimate-ethinyl estradiol (ORTHO-CYCLEN) 0.25-35 MG-MCG tablet       Recommended follow up: annual Return in about 1 year (around 06/24/2023) for annual w/pap. Future Appointments  Date Time Provider Department Center  06/28/2022  8:45 AM 06/30/2022, PT [facility] None    Lab/Order associations:not fasting   ICD-10-CM   1. Wellness examination  Z00.00 Comprehensive metabolic panel    Lipid panel    TSH    CBC with Differential/Platelet    2. Encounter for birth control pills maintenance  Z30.41 norgestimate-ethinyl estradiol (ORTHO-CYCLEN) 0.25-35 MG-MCG tablet    Wellness-anticipatory guidance.  Work on [employer].  Check CBC,CMP,lipids,TSH  F/u 1 yr  w/pap [facility]-renewed.  Pap due next yr  Meds ordered this encounter  Medications   norgestimate-ethinyl estradiol (ORTHO-CYCLEN) 0.25-35 MG-MCG tablet    Sig: Take 1 tablet by mouth at bedtime.    Dispense:  84 tablet    Refill:  4     Wellington Hampshire, MD

## 2022-06-23 NOTE — Patient Instructions (Signed)
It was very nice to see you today!  Stay active   PLEASE NOTE:  If you had any lab tests please let us know if you have not heard back within a few days. You may see your results on MyChart before we have a chance to review them but we will give you a call once they are reviewed by us. If we ordered any referrals today, please let us know if you have not heard from their office within the next week.   Please try these tips to maintain a healthy lifestyle:  Eat most of your calories during the day when you are active. Eliminate processed foods including packaged sweets (pies, cakes, cookies), reduce intake of potatoes, white bread, white pasta, and white rice. Look for whole grain options, oat flour or almond flour.  Each meal should contain half fruits/vegetables, one quarter protein, and one quarter carbs (no bigger than a computer mouse).  Cut down on sweet beverages. This includes juice, soda, and sweet tea. Also watch fruit intake, though this is a healthier sweet option, it still contains natural sugar! Limit to 3 servings daily.  Drink at least 1 glass of water with each meal and aim for at least 8 glasses per day  Exercise at least 150 minutes every week.   

## 2022-06-28 ENCOUNTER — Encounter: Payer: Self-pay | Admitting: Physical Therapy

## 2022-06-28 ENCOUNTER — Ambulatory Visit (INDEPENDENT_AMBULATORY_CARE_PROVIDER_SITE_OTHER): Payer: Managed Care, Other (non HMO) | Admitting: Physical Therapy

## 2022-06-28 DIAGNOSIS — M6281 Muscle weakness (generalized): Secondary | ICD-10-CM | POA: Diagnosis not present

## 2022-06-28 DIAGNOSIS — M542 Cervicalgia: Secondary | ICD-10-CM

## 2022-06-28 NOTE — Therapy (Signed)
OUTPATIENT PHYSICAL THERAPY TREATMENT NOTE    Patient Name: Sierra Hernandez MRN: [id number] DOB:Feb 08, 1991, 32 y.o., female Today's Date: 06/28/2022  PCP: Tawnya Crook, MD   REFERRING PROVIDER: Gregor Hams, MD   END OF SESSION:   PT End of Session - 06/28/22 0851     Visit Number 12    Number of Visits 17    Date for PT Re-Evaluation 07/18/22    Authorization Type cigna    PT Start Time [phone]    PT Stop Time 0930    PT Time Calculation (min) 38 min    Activity Tolerance Patient tolerated treatment well    Behavior During Therapy [facility] for tasks assessed/performed              Past Medical History:  Diagnosis Date   Allergy    Asthma    Elevated LFTs    Gallstones    IBS (irritable bowel syndrome)    Pancreatitis 01/2017   Scoliosis    Past Surgical History:  Procedure Laterality Date   CHOLECYSTECTOMY N/A 02/05/2017   Procedure: LAPAROSCOPIC CHOLECYSTECTOMY WITH INTRAOPERATIVE CHOLANGIOGRAM;  Surgeon: Erroll Luna, MD;  Location: [place];  Service: General;  Laterality: N/A;   ERCP N/A 02/06/2017   Procedure: ENDOSCOPIC RETROGRADE CHOLANGIOPANCREATOGRAPHY (ERCP);  Surgeon: Doran Stabler, MD;  Location: [facility];  Service: Gastroenterology;  Laterality: N/A;   MOLE REMOVAL     x 2 on back   Patient Active Problem List   Diagnosis Date Noted   Epigastric pain 12/20/2018   Intraabdominal fluid collection    Abnormal liver function tests    Protein-calorie malnutrition, severe 02/14/2017   Abdominal distention    Abdominal fluid collection    Acute pancreatitis 02/08/2017   Gallstones    Elevated LFTs      REFERRING DIAG: R07.89 (ICD-10-CM) - Other chest pain M54.12 (ICD-10-CM) - Cervical radiculitis  THERAPY DIAG:  Cervicalgia  Muscle weakness (generalized)  Rationale for Evaluation and Treatment Rehabilitation  PERTINENT HISTORY: Hx of gall bladder removal   PRECAUTIONS: None   SUBJECTIVE:  06/28/2022 States that she has been  aware of her movements and how she moves mostly from her mid back.    EVAL: States that she has pain across her chest and into her arm and it happens on both sides but L>R. States that she goes to body pump and piliates once a week. States that the pain would come about a week after and she felt deep in her chest pain. States that she has stopped going to her classes for the last 2 months and the pain has resolved mostly.  States that occasionally she reaches overhead into her cabinets she feels it.  PAIN:  Are you having pain? no: NPRS scale:0/10 Pain location: deep in the chest  under her breast Pain description: sharp, short lived Aggravating factors: body pump classes Relieving factors: rest OBJECTIVE:    DIAGNOSTIC FINDINGS:  Xray of chest and neck normal     COGNITION: Overall cognitive status: Within functional limits for tasks assessed     POSTURE: rounded shoulders, forward head, increased thoracic kyphosis, and flexed trunk    PALPATION: Tenderness to palpation along intercostals L>R anterior. Hypomobility in PA to ribs with reproduction of symptoms ribs 3-5       CERVICAL ROM:    Active ROM A/PROM (deg) 04/25/22  Flexion 40   Extension WFL   Right lateral flexion 20 (tightness)  Left lateral flexion 20 (tightness)  Right rotation  60  Left rotation 45   (Blank rows = not tested)                UE Measurements       Upper Extremity Right 04/25/2022 Left 04/25/2022    A/PROM MMT A/PROM MMT  Shoulder Flexion 160 4+* 160 4+  Shoulder Extension          Shoulder Abduction   4+*   4+*  Shoulder Adduction          Shoulder Internal Rotation Reaches to T6 SP 3+ Reaches to T4SP 3+  Shoulder External Rotation Reaches to T4SP 4- Reaches to T4SP 4-  Elbow Flexion          Elbow Extension          Wrist Flexion          Wrist Extension          Wrist Supination          Wrist Pronation          Wrist Ulnar Deviation          Wrist Radial Deviation          Grip  Strength NA   NA                          (Blank rows = not tested)                       * pain                       **tightness Right handed     CERVICAL SPECIAL TESTS:  Neg Spurling + R median neural tension Limited pec and lat length bilaterally      TODAY'S TREATMENT:  06/28/2022 Therapeutic Exercise:            Aerobic: Supine:    Prone:             Seated:             Standing:    Neuromuscular Re-education:   spider pose - ribs off table -15 minutes, hip hinge at side of table 10 minutes, lower rib approximation seated with scap protraction and chin tuck 13 minutes Manual Therapy:     Therapeutic Activity: Self Care: Trigger Point Dry Needling:  Modalities:          PATIENT EDUCATION:  Education details: on hip hinge position and how it is important, how to perform and how to incorporate into daily routine (brushing teeth or washing face) Person educated: Patient Education method: Explanation, Demonstration, and Handouts Education comprehension: verbalized understanding   HOME EXERCISE PROGRAM: Access Code: [email or web address]    ASSESSMENT:   CLINICAL IMPRESSION: 06/28/2022  Focused on movement retraining with prior demonstration and education on rationale for interventions. Added new exercises to HEP. Improved hip hinge noted on this date but still difficult for patient to complete. Will continue to focus on posture and movement retraining as tolerated.        OBJECTIVE IMPAIRMENTS decreased activity tolerance, decreased ROM, decreased strength, postural dysfunction, and pain.    ACTIVITY LIMITATIONS lifting   PARTICIPATION LIMITATIONS:  body pump classes   PERSONAL FACTORS Fitness are also affecting patient's functional outcome.    REHAB POTENTIAL: Excellent   CLINICAL DECISION MAKING: Stable/uncomplicated   EVALUATION COMPLEXITY: Low     GOALS: Goals reviewed with patient?  yes  SHORT TERM GOALS:   Patient will be independent in self management  strategies to improve quality of life and functional outcomes. Baseline: new program Target date: 03/01/2022 Goal status: MET    2.  Patient will report at least 50% improvement in overall symptoms and/or function to demonstrate improved functional mobility Baseline: 0% Target date: 03/01/2022 Goal status: MET   3.  Patient will be able to reach overhead and place dishes in overhead  shelf without pain to improve overall function Baseline: painful Target date: 03/01/2022 Goal status: MET         LONG TERM GOALS:   Patient will report at least 75% improvement in overall symptoms and/or function to demonstrate improved functional mobility Baseline: 0% Target date: 04/05/2022 Goal status: PROGRESSING   2.  Patient will demonstrate at least 4/5 MMT in B UE to demonstrate improved strength in arms Baseline: see above Target date: 04/05/2022 Goal status: PROGRESSING    3.  Patient will report sitting in more active posture throughout the day to reduce stress on posterior chain Baseline: not currently Target date: 04/05/2022 Goal status: MET     PLAN: PT FREQUENCY: 1-3x/month for total of  8 visits over 12 week certification period    PT DURATION: 10 weeks   PLANNED INTERVENTIONS: Therapeutic exercises, Therapeutic activity, Neuromuscular re-education, Balance training, Gait training, Patient/Family education, Self Care, Joint mobilization, Joint manipulation, Stair training, Aquatic Therapy, Dry Needling, Electrical stimulation, Spinal manipulation, Spinal mobilization, Cryotherapy, Moist heat, Traction, Ultrasound, Ionotophoresis 4mg /ml Dexamethasone, and Manual therapy   PLAN FOR NEXT SESSION: scapular protraction, shoulder MMT and ROM, f/u with seated hip hinge   9:34 AM, 06/28/22 Jerene Pitch, DPT Physical Therapy with [facility]

## 2022-07-11 ENCOUNTER — Ambulatory Visit (INDEPENDENT_AMBULATORY_CARE_PROVIDER_SITE_OTHER): Payer: Managed Care, Other (non HMO) | Admitting: Physical Therapy

## 2022-07-11 ENCOUNTER — Encounter: Payer: Self-pay | Admitting: Physical Therapy

## 2022-07-11 DIAGNOSIS — M542 Cervicalgia: Secondary | ICD-10-CM

## 2022-07-11 DIAGNOSIS — M6281 Muscle weakness (generalized): Secondary | ICD-10-CM

## 2022-07-11 NOTE — Therapy (Signed)
OUTPATIENT PHYSICAL THERAPY TREATMENT NOTE    Patient Name: Sierra Hernandez MRN: [id number] DOB:[birth year], 32 y.o., female Today's Date: 07/11/2022  PCP: Tawnya Crook, MD   REFERRING PROVIDER: Gregor Hams, MD   END OF SESSION:   PT End of Session - 07/11/22 0803     Visit Number 13    Number of Visits 17    Date for PT Re-Evaluation 07/18/22    Authorization Type cigna    PT Start Time 0804    PT Stop Time 0844    PT Time Calculation (min) 40 min    Activity Tolerance Patient tolerated treatment well    Behavior During Therapy [facility] for tasks assessed/performed              Past Medical History:  Diagnosis Date   Allergy    Asthma    Elevated LFTs    Gallstones    IBS (irritable bowel syndrome)    Pancreatitis 01/2017   Scoliosis    Past Surgical History:  Procedure Laterality Date   CHOLECYSTECTOMY N/A 02/05/2017   Procedure: LAPAROSCOPIC CHOLECYSTECTOMY WITH INTRAOPERATIVE CHOLANGIOGRAM;  Surgeon: Erroll Luna, MD;  Location: [place];  Service: General;  Laterality: N/A;   ERCP N/A 02/06/2017   Procedure: ENDOSCOPIC RETROGRADE CHOLANGIOPANCREATOGRAPHY (ERCP);  Surgeon: Doran Stabler, MD;  Location: [place];  Service: Gastroenterology;  Laterality: N/A;   MOLE REMOVAL     x 2 on back   Patient Active Problem List   Diagnosis Date Noted   Epigastric pain 12/20/2018   Intraabdominal fluid collection    Abnormal liver function tests    Protein-calorie malnutrition, severe 02/14/2017   Abdominal distention    Abdominal fluid collection    Acute pancreatitis 02/08/2017   Gallstones    Elevated LFTs      REFERRING DIAG: R07.89 (ICD-10-CM) - Other chest pain M54.12 (ICD-10-CM) - Cervical radiculitis  THERAPY DIAG:  Cervicalgia  Muscle weakness (generalized)  Rationale for Evaluation and Treatment Rehabilitation  PERTINENT HISTORY: Hx of gall bladder removal   PRECAUTIONS: None   SUBJECTIVE:  07/11/2022 States that's she was  sitting in an unsupportive chair on Sunday and he lower ribs feel tight. States she wants to get back to strength conditioning.    EVAL: States that she has pain across her chest and into her arm and it happens on both sides but L>R. States that she goes to body pump and piliates once a week. States that the pain would come about a week after and she felt deep in her chest pain. States that she has stopped going to her classes for the last 2 months and the pain has resolved mostly.  States that occasionally she reaches overhead into her cabinets she feels it.  PAIN:  Are you having pain? no: NPRS scale:0/10 Pain location: deep in the chest  under her breast Pain description: sharp, short lived Aggravating factors: body pump classes Relieving factors: rest OBJECTIVE:    DIAGNOSTIC FINDINGS:  Xray of chest and neck normal     COGNITION: Overall cognitive status: Within functional limits for tasks assessed     POSTURE: rounded shoulders, forward head, increased thoracic kyphosis, and flexed trunk    PALPATION: Tenderness to palpation along intercostals L>R anterior. Hypomobility in PA to ribs with reproduction of symptoms ribs 3-5       CERVICAL ROM:    Active ROM A/PROM (deg) 04/25/22  Flexion 40   Extension WFL   Right lateral flexion 20 (tightness)  Left lateral flexion 20 (tightness)  Right rotation 60  Left rotation 45   (Blank rows = not tested)                UE Measurements       Upper Extremity Right 04/25/2022 Left 04/25/2022    A/PROM MMT A/PROM MMT  Shoulder Flexion 160 4+* 160 4+  Shoulder Extension          Shoulder Abduction   4+*   4+*  Shoulder Adduction          Shoulder Internal Rotation Reaches to T6 SP 3+ Reaches to T4SP 3+  Shoulder External Rotation Reaches to T4SP 4- Reaches to T4SP 4-  Elbow Flexion          Elbow Extension          Wrist Flexion          Wrist Extension          Wrist Supination          Wrist Pronation          Wrist  Ulnar Deviation          Wrist Radial Deviation          Grip Strength NA   NA                          (Blank rows = not tested)                       * pain                       **tightness Right handed     CERVICAL SPECIAL TESTS:  Neg Spurling + R median neural tension Limited pec and lat length bilaterally      TODAY'S TREATMENT:  07/11/2022 Therapeutic Exercise:            Aerobic: Supine:   shoulder flexion in dead bug assisted on wall with band without assistance, with weight in both positions 15 minutes, skull crushers 1 handed and 2 handed with shoulder rotation 3 # B - x20  Prone:             Seated:             Standing:    Neuromuscular Re-education:     Therapeutic Activity: Self Care: Trigger Point Dry Needling:  Modalities:          PATIENT EDUCATION:  Education details: on different types of classes online and how to make modifications Person educated: Patient Education method: Explanation, Demonstration, and Handouts Education comprehension: verbalized understanding   HOME EXERCISE PROGRAM: Access Code: [email or web address]    ASSESSMENT:   CLINICAL IMPRESSION: 07/11/2022  Session focused on ways to incorporate strength training with home videos, home exercises and discussion of different classes or types of exercise to perform. Practiced modifications with various degrees of support so paitnet could currently modify exercises as needed. Will continue with current POC as tolerated.       OBJECTIVE IMPAIRMENTS decreased activity tolerance, decreased ROM, decreased strength, postural dysfunction, and pain.    ACTIVITY LIMITATIONS lifting   PARTICIPATION LIMITATIONS:  body pump classes   PERSONAL FACTORS Fitness are also affecting patient's functional outcome.    REHAB POTENTIAL: Excellent   CLINICAL DECISION MAKING: Stable/uncomplicated   EVALUATION COMPLEXITY: Low     GOALS: Goals reviewed with patient?  yes  SHORT TERM GOALS:   Patient will  be independent in self management strategies to improve quality of life and functional outcomes. Baseline: new program Target date: 03/01/2022 Goal status: MET    2.  Patient will report at least 50% improvement in overall symptoms and/or function to demonstrate improved functional mobility Baseline: 0% Target date: 03/01/2022 Goal status: MET   3.  Patient will be able to reach overhead and place dishes in overhead  shelf without pain to improve overall function Baseline: painful Target date: 03/01/2022 Goal status: MET         LONG TERM GOALS:   Patient will report at least 75% improvement in overall symptoms and/or function to demonstrate improved functional mobility Baseline: 0% Target date: 04/05/2022 Goal status: PROGRESSING   2.  Patient will demonstrate at least 4/5 MMT in B UE to demonstrate improved strength in arms Baseline: see above Target date: 04/05/2022 Goal status: PROGRESSING    3.  Patient will report sitting in more active posture throughout the day to reduce stress on posterior chain Baseline: not currently Target date: 04/05/2022 Goal status: MET     PLAN: PT FREQUENCY: 1-3x/month for total of  8 visits over 12 week certification period    PT DURATION: 10 weeks   PLANNED INTERVENTIONS: Therapeutic exercises, Therapeutic activity, Neuromuscular re-education, Balance training, Gait training, Patient/Family education, Self Care, Joint mobilization, Joint manipulation, Stair training, Aquatic Therapy, Dry Needling, Electrical stimulation, Spinal manipulation, Spinal mobilization, Cryotherapy, Moist heat, Traction, Ultrasound, Ionotophoresis 36m/ml Dexamethasone, and Manual therapy   PLAN FOR NEXT SESSION: PN possible DC next session scapular protraction, shoulder MMT and ROM, f/u with seated hip hinge   11:50 AM, 07/11/22 MJerene Pitch DPT Physical Therapy with [facility]

## 2022-07-24 ENCOUNTER — Encounter: Payer: Self-pay | Admitting: Physical Therapy

## 2022-07-24 ENCOUNTER — Ambulatory Visit (INDEPENDENT_AMBULATORY_CARE_PROVIDER_SITE_OTHER): Payer: Managed Care, Other (non HMO) | Admitting: Physical Therapy

## 2022-07-24 DIAGNOSIS — M6281 Muscle weakness (generalized): Secondary | ICD-10-CM

## 2022-07-24 DIAGNOSIS — M542 Cervicalgia: Secondary | ICD-10-CM

## 2022-07-24 NOTE — Therapy (Signed)
OUTPATIENT PHYSICAL THERAPY TREATMENT NOTE   PHYSICAL THERAPY DISCHARGE SUMMARY  Visits from Start of Care: 14  Current functional level related to goals / functional outcomes: See below   Remaining deficits: See below   Education / Equipment: See  below   Patient agrees to discharge. Patient goals were partially met. Patient is being discharged due to being pleased with the current functional level.  Patient Name: Sierra Hernandez MRN: [id number] DOB:1990/07/10, 32 y.o., female Today's Date: 07/24/2022  PCP: Tawnya Crook, MD   REFERRING PROVIDER: Gregor Hams, MD   END OF SESSION:   PT End of Session - 07/24/22 0801     Visit Number 14    Number of Visits 17    Date for PT Re-Evaluation 07/18/22    Authorization Type cigna    PT Start Time 0804    PT Stop Time 0842    PT Time Calculation (min) 38 min    Activity Tolerance Patient tolerated treatment well    Behavior During Therapy [facility] for tasks assessed/performed              Past Medical History:  Diagnosis Date   Allergy    Asthma    Elevated LFTs    Gallstones    IBS (irritable bowel syndrome)    Pancreatitis 01/2017   Scoliosis    Past Surgical History:  Procedure Laterality Date   CHOLECYSTECTOMY N/A 02/05/2017   Procedure: LAPAROSCOPIC CHOLECYSTECTOMY WITH INTRAOPERATIVE CHOLANGIOGRAM;  Surgeon: Erroll Luna, MD;  Location: [place];  Service: General;  Laterality: N/A;   ERCP N/A 02/06/2017   Procedure: ENDOSCOPIC RETROGRADE CHOLANGIOPANCREATOGRAPHY (ERCP);  Surgeon: Doran Stabler, MD;  Location: [place];  Service: Gastroenterology;  Laterality: N/A;   MOLE REMOVAL     x 2 on back   Patient Active Problem List   Diagnosis Date Noted   Epigastric pain 12/20/2018   Intraabdominal fluid collection    Abnormal liver function tests    Protein-calorie malnutrition, severe 02/14/2017   Abdominal distention    Abdominal fluid collection    Acute pancreatitis 02/08/2017   Gallstones     Elevated LFTs      REFERRING DIAG: R07.89 (ICD-10-CM) - Other chest pain M54.12 (ICD-10-CM) - Cervical radiculitis  THERAPY DIAG:  Cervicalgia  Muscle weakness (generalized)  Rationale for Evaluation and Treatment Rehabilitation  PERTINENT HISTORY: Hx of gall bladder removal   PRECAUTIONS: None   SUBJECTIVE:  07/24/2022 States that she tested some of the you tube videos and has been doing them and didn't really have any pain but did have some soreness in her chest on the left side and neck. Wants to go over some of the exercises.    EVAL: States that she has pain across her chest and into her arm and it happens on both sides but L>R. States that she goes to body pump and piliates once a week. States that the pain would come about a week after and she felt deep in her chest pain. States that she has stopped going to her classes for the last 2 months and the pain has resolved mostly.  States that occasionally she reaches overhead into her cabinets she feels it.  PAIN:  Are you having pain? no: NPRS scale:0/10 Pain location: deep in the chest  under her breast Pain description: sharp, short lived Aggravating factors: body pump classes Relieving factors: rest OBJECTIVE:    DIAGNOSTIC FINDINGS:  Xray of chest and neck normal  COGNITION: Overall cognitive status: Within functional limits for tasks assessed     POSTURE: rounded shoulders, forward head, increased thoracic kyphosis, and flexed trunk    PALPATION: Tenderness to palpation along intercostals L>R anterior. Hypomobility in PA to ribs with reproduction of symptoms ribs 3-5       CERVICAL ROM:    Active ROM A/PROM (deg) 04/25/22  Flexion 40   Extension WFL   Right lateral flexion 20 (tightness)  Left lateral flexion 20 (tightness)  Right rotation 60  Left rotation 45   (Blank rows = not tested)                UE Measurements       Upper Extremity Right 04/25/2022 Left 04/25/2022    A/PROM MMT  A/PROM MMT  Shoulder Flexion 160 4+* 160 4+  Shoulder Extension          Shoulder Abduction   4+*   4+*  Shoulder Adduction          Shoulder Internal Rotation Reaches to T6 SP 3+ Reaches to T4SP 3+  Shoulder External Rotation Reaches to T4SP 4- Reaches to T4SP 4-  Elbow Flexion          Elbow Extension          Wrist Flexion          Wrist Extension          Wrist Supination          Wrist Pronation          Wrist Ulnar Deviation          Wrist Radial Deviation          Grip Strength NA   NA                          (Blank rows = not tested)                       * pain                       **tightness Right handed     CERVICAL SPECIAL TESTS:  Neg Spurling + R median neural tension Limited pec and lat length bilaterally      TODAY'S TREATMENT:  07/24/2022 Therapeutic Exercise:            Aerobic: Supine:  chest press 3# 2x12, shoulder fly 2x12 3#, hip thrusts feet close and far apart 3x8   Prone:             Seated:             Standing:  self mobilization to B thoracic paraspinals with tennis ball at wall 8 minutes, dead lift with form 10 minutes  Neuromuscular Re-education:     Therapeutic Activity: Self Care: Trigger Point Dry Needling:  Modalities:          PATIENT EDUCATION:  Education details: on use of heat and massage prior to working out at home, on form and focus with exercises Person educated: Patient Education method: Consulting civil engineer, Media planner, and Handouts Education comprehension: verbalized understanding   HOME EXERCISE PROGRAM: Access Code: [email or web address]    ASSESSMENT:   CLINICAL IMPRESSION: 07/24/2022 Focused on review of exercises, form as patient had started performing strengthening exercises at home with you tube videos and wanted to make sure her form was correct. Discussed holds, weights and progressions. No pain noted  during session. Discussed proper soft tissue warmup prior to exercises. Patient to discharge from PT to HEP secondary to  progress made. Answered all questions prior to end of session.      OBJECTIVE IMPAIRMENTS decreased activity tolerance, decreased ROM, decreased strength, postural dysfunction, and pain.    ACTIVITY LIMITATIONS lifting   PARTICIPATION LIMITATIONS:  body pump classes   PERSONAL FACTORS Fitness are also affecting patient's functional outcome.    REHAB POTENTIAL: Excellent   CLINICAL DECISION MAKING: Stable/uncomplicated   EVALUATION COMPLEXITY: Low     GOALS: Goals reviewed with patient?  yes   SHORT TERM GOALS:   Patient will be independent in self management strategies to improve quality of life and functional outcomes. Baseline: new program Target date: 03/01/2022 Goal status: MET    2.  Patient will report at least 50% improvement in overall symptoms and/or function to demonstrate improved functional mobility Baseline: 0% Target date: 03/01/2022 Goal status: MET   3.  Patient will be able to reach overhead and place dishes in overhead  shelf without pain to improve overall function Baseline: painful Target date: 03/01/2022 Goal status: MET         LONG TERM GOALS:   Patient will report at least 75% improvement in overall symptoms and/or function to demonstrate improved functional mobility Baseline: 0% Target date: 04/05/2022 Goal status: PROGRESSING   2.  Patient will demonstrate at least 4/5 MMT in B UE to demonstrate improved strength in arms Baseline: see above Target date: 04/05/2022 Goal status: PROGRESSING    3.  Patient will report sitting in more active posture throughout the day to reduce stress on posterior chain Baseline: not currently Target date: 04/05/2022 Goal status: MET     PLAN: PT FREQUENCY: 1-3x/month for total of  8 visits over 12 week certification period    PT DURATION: 10 weeks   PLANNED INTERVENTIONS: Therapeutic exercises, Therapeutic activity, Neuromuscular re-education, Balance training, Gait training, Patient/Family education,  Self Care, Joint mobilization, Joint manipulation, Stair training, Aquatic Therapy, Dry Needling, Electrical stimulation, Spinal manipulation, Spinal mobilization, Cryotherapy, Moist heat, Traction, Ultrasound, Ionotophoresis '4mg'$ /ml Dexamethasone, and Manual therapy   PLAN FOR NEXT SESSION: DC to HEP   8:51 AM, 07/24/22 Jerene Pitch, DPT Physical Therapy with [facility]

## 2022-08-03 IMAGING — US US BREAST*L* LIMITED INC AXILLA
1 series · 7 of 7 positions shown · non-contrast
Comparison: None Available.

CLINICAL DATA: 30-year-old female with diffuse INNER LEFT breast
pain.

EXAM:
ULTRASOUND OF THE LEFT BREAST

[Series 1: us breast*left* limited inc axilla · 0.06mm/px · 7 of 7 slices shown]
[im 1/7]
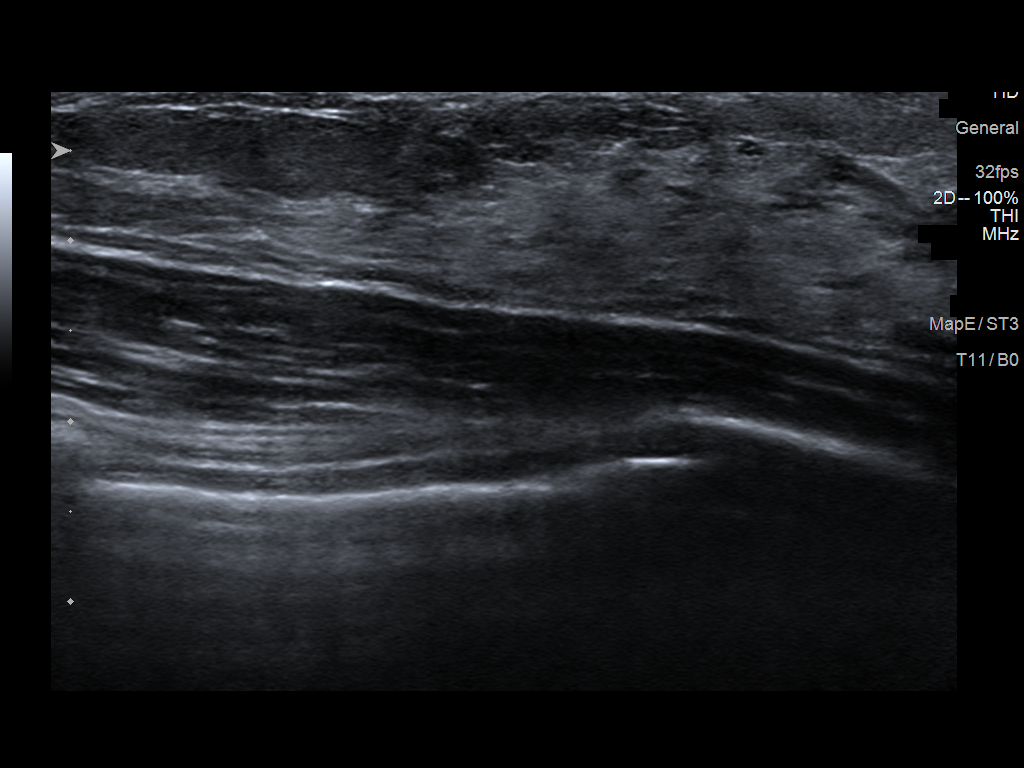
[im 2/7]
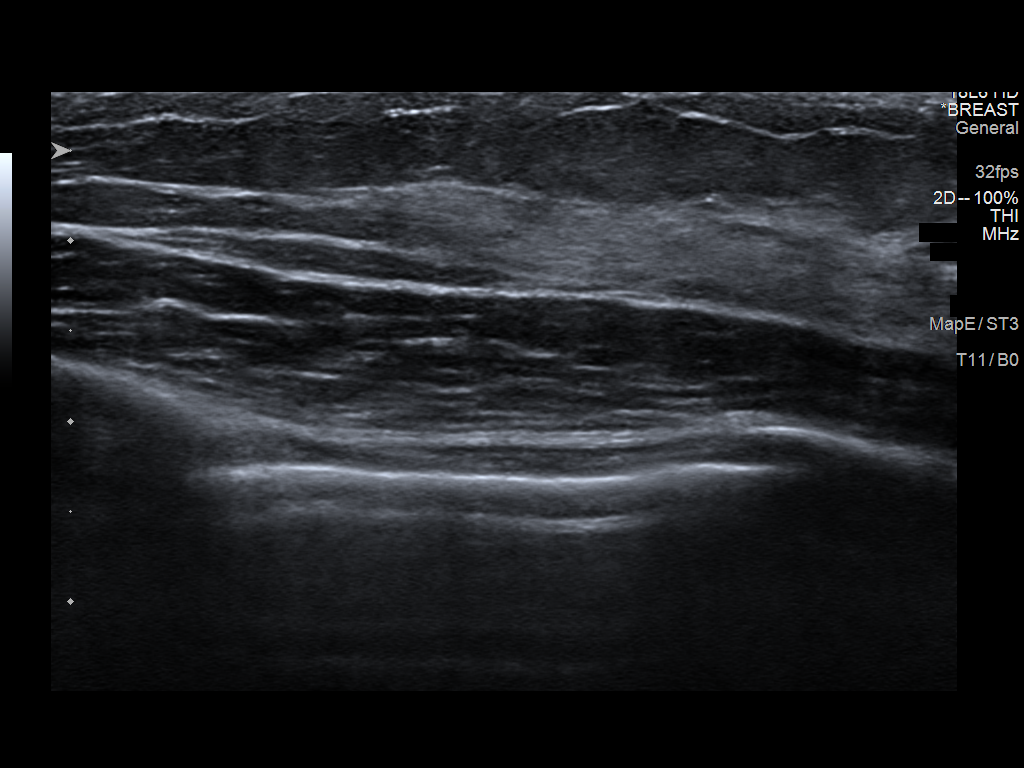
[im 3/7]
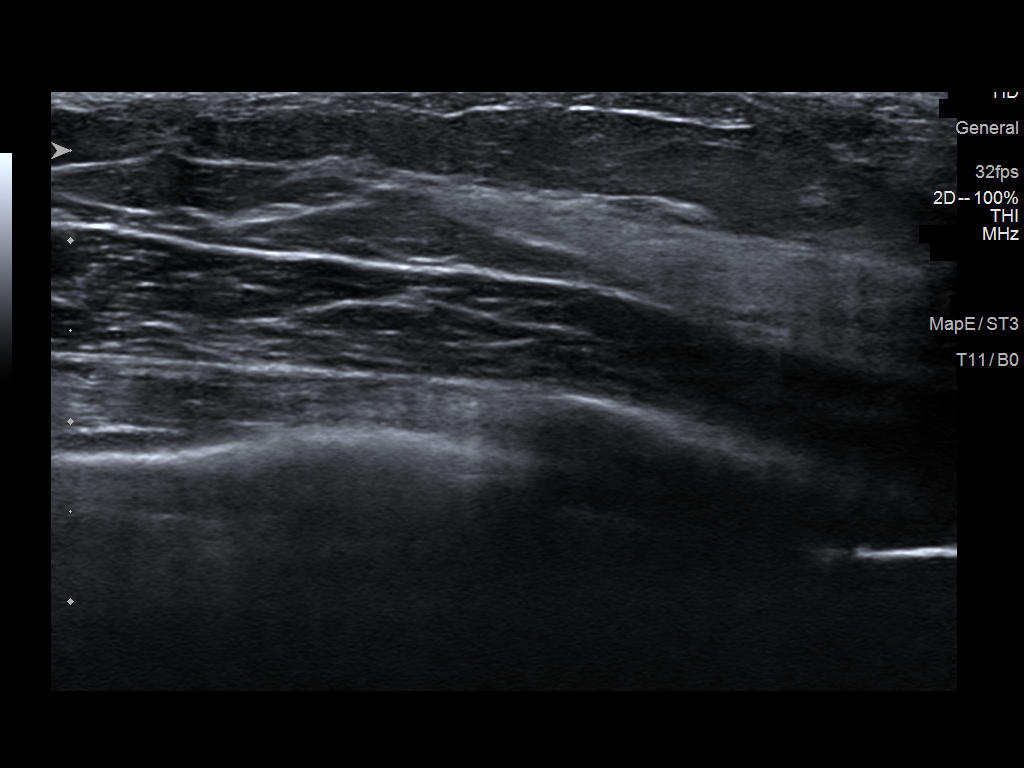
[im 4/7]
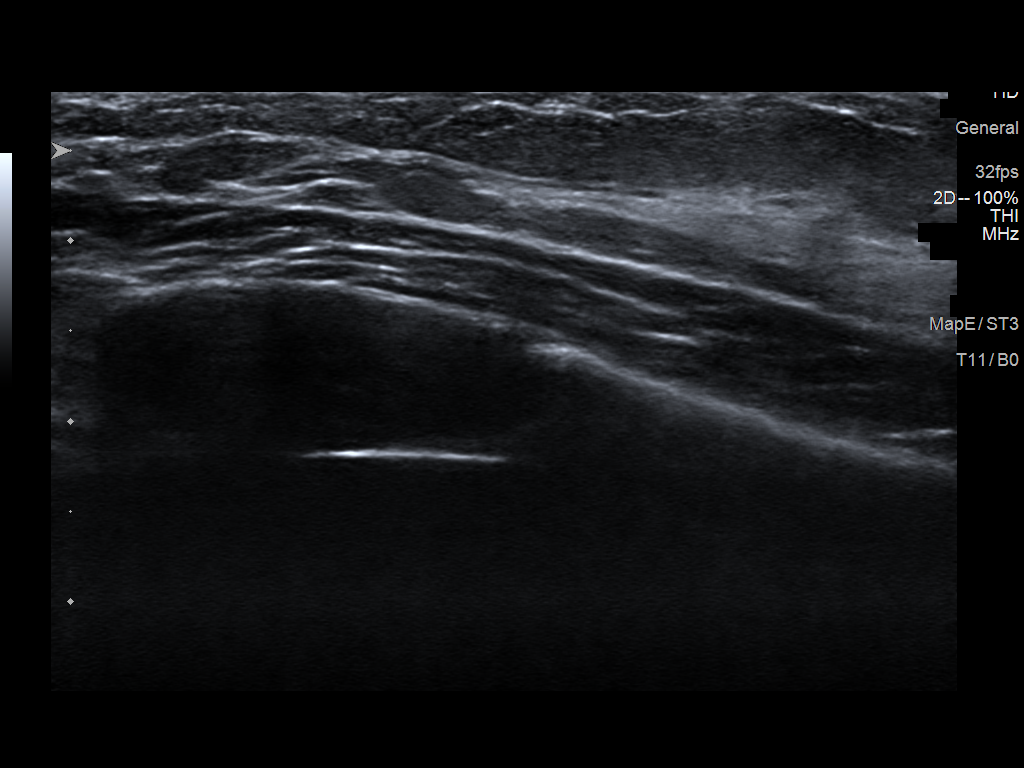
[im 5/7]
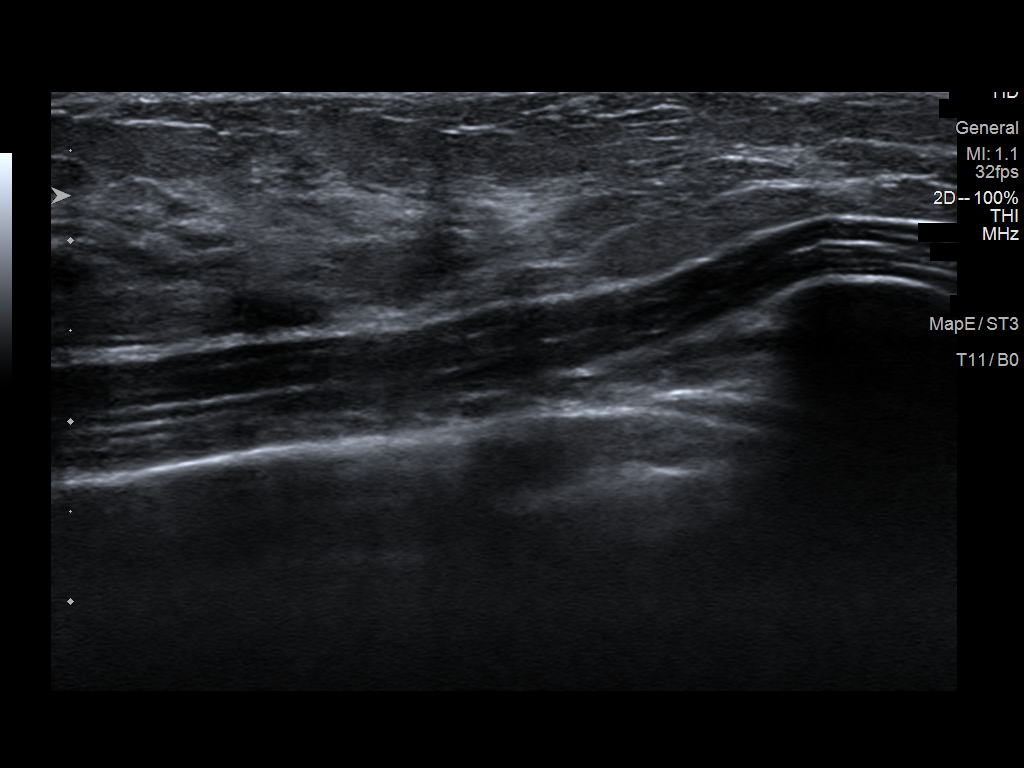
[im 6/7]
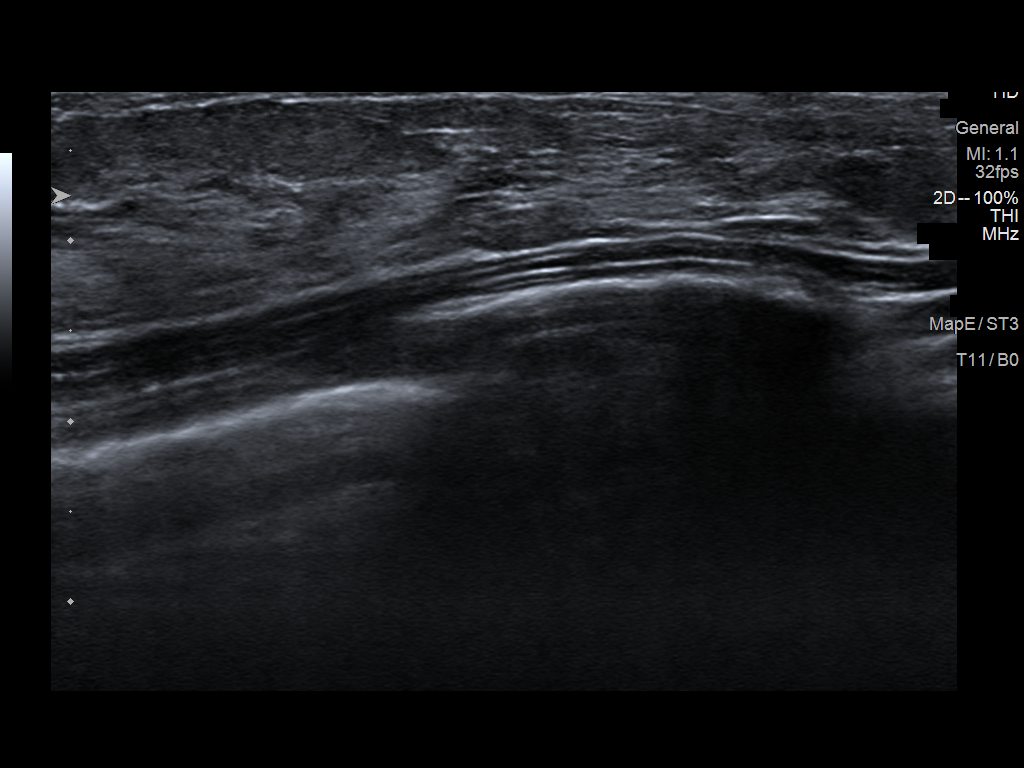
[im 7/7]
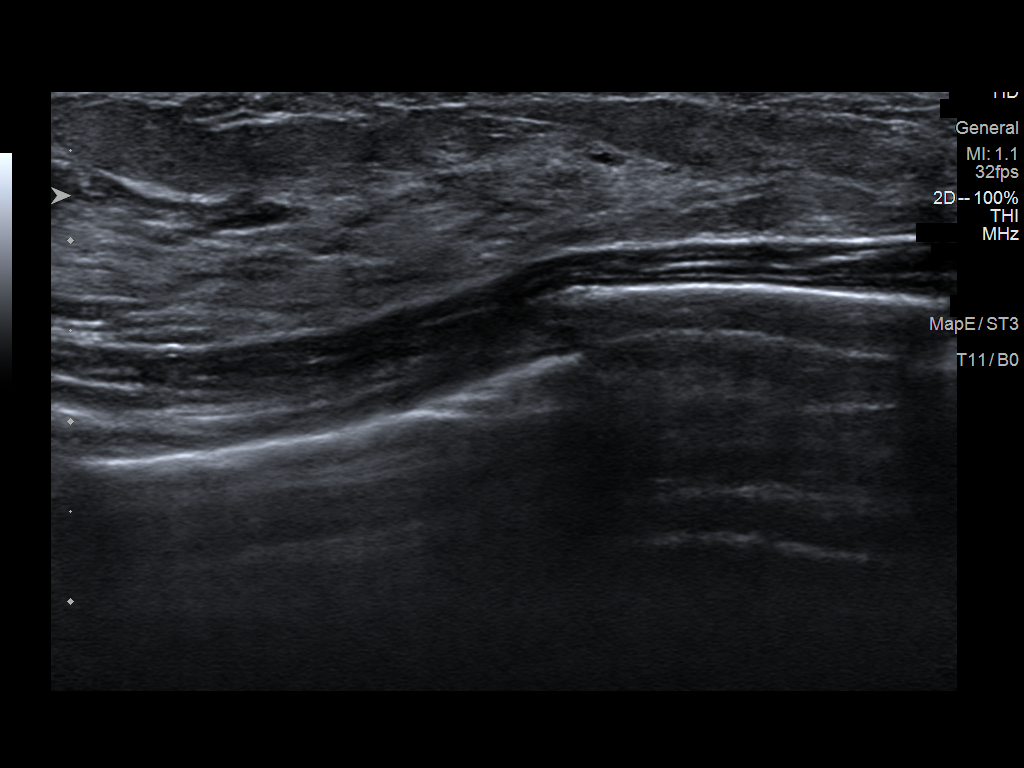

[7 of 7 positions shown; findings below may reference images not displayed]

FINDINGS: Targeted ultrasound is performed, showing no sonographic
abnormalities within the entire INNER LEFT breast.
IMPRESSION: No sonographic abnormalities within the INNER LEFT breast.

RECOMMENDATION:
Consider clinical follow-up as indicated. Any further workup should
be based on clinical grounds.

I have discussed the findings and recommendations with the patient.
If applicable, a reminder letter will be sent to the patient
regarding the next appointment.

BI-RADS CATEGORY  1: Negative.

## 2022-08-08 ENCOUNTER — Other Ambulatory Visit: Payer: Self-pay | Admitting: Family Medicine

## 2022-08-08 DIAGNOSIS — Z3041 Encounter for surveillance of contraceptive pills: Secondary | ICD-10-CM

## 2022-10-05 ENCOUNTER — Encounter: Payer: Self-pay | Admitting: Family Medicine

## 2023-01-11 ENCOUNTER — Ambulatory Visit: Payer: Managed Care, Other (non HMO) | Admitting: Gastroenterology

## 2023-01-11 ENCOUNTER — Other Ambulatory Visit: Payer: Managed Care, Other (non HMO)

## 2023-01-11 ENCOUNTER — Encounter: Payer: Self-pay | Admitting: Gastroenterology

## 2023-01-11 VITALS — BP 104/60 | HR 79 | Ht 65.0 in | Wt 136.0 lb

## 2023-01-11 DIAGNOSIS — K582 Mixed irritable bowel syndrome: Secondary | ICD-10-CM | POA: Diagnosis not present

## 2023-01-11 NOTE — Progress Notes (Signed)
01/11/2023 Sierra Hernandez [id number] 20-Mar-1991   HISTORY OF PRESENT ILLNESS: This is a 32 year old female who is a patient of Dr. Lamar Sprinkles.  Previously she had been seen here for issues related to elevated LFTs and gallstones.  Now she is here today with complaints of alternating bowel habits and some intermittent abdominal cramping.  She says that all of this has been present dating back to when she was in second grade.  She says that she tends alternate between loose stools and sometimes some occasional hard type stools.  She says that she will get intermittent abdominal cramping, but it is very fleeting and usually only lasts a couple of minutes or so and then resolves.  Have some urgency with her bowels.  She just returned from a 3-week trip to [place] to see her grandparents and was some having some issues upon her return, but over the past several days things have straightened out again for now.  Basic labs including a TSH and CRP levels previously have been normal.  She really just assumed that it was IBS as that was what people told her in the past and she just wanted to see if there are any recommendations on how to manage it, etc.  No rectal bleeding.  Symptoms are very inconsistent and not every day, etc.   Past Medical History:  Diagnosis Date   Allergy    Asthma    Elevated LFTs    Gallstones    IBS (irritable bowel syndrome)    Pancreatitis 01/2017   Scoliosis    Past Surgical History:  Procedure Laterality Date   CHOLECYSTECTOMY N/A 02/05/2017   Procedure: LAPAROSCOPIC CHOLECYSTECTOMY WITH INTRAOPERATIVE CHOLANGIOGRAM;  Surgeon: Harriette Bouillon, MD;  Location: [facility];  Service: General;  Laterality: N/A;   ERCP N/A 02/06/2017   Procedure: ENDOSCOPIC RETROGRADE CHOLANGIOPANCREATOGRAPHY (ERCP);  Surgeon: Sherrilyn Rist, MD;  Location: [facility] ENDOSCOPY;  Service: Gastroenterology;  Laterality: N/A;   MOLE REMOVAL     x 2 on back    reports that she has never smoked. She has  never used smokeless tobacco. She reports current alcohol use. She reports that she does not use drugs. family history includes Arthritis in her maternal grandmother and paternal grandmother; Cancer in her mother; Gallstones in her maternal grandfather; Heart disease in her maternal grandmother; Hyperlipidemia in her father; Melanoma in her mother; Thyroid cancer in her paternal grandfather. Allergies  Allergen Reactions   Amoxicillin-Pot Clavulanate Itching    Itching ? If related to complications with gallbladder or with augmentin Itching ? If related to complications with gallbladder or with augmentin Itching ? If related to complications with gallbladder or with augmentin Itching ? If related to complications with gallbladder or with augmentin Itching ? If related to complications with gallbladder or with augmentin   Minocycline Swelling and Other (See Comments)    Joint pain and swelling, exhaustion Joint pain and swelling, exhaustion Other reaction(s): Other Joint pain and swelling, exhaustion Joint pain and swelling, exhaustion Joint pain and swelling, exhaustion      Outpatient Encounter Medications as of 01/11/2023  Medication Sig   fexofenadine (ALLEGRA) 60 MG tablet Take 60 mg by mouth daily.   Multiple Vitamin (MULTIVITAMIN WITH MINERALS) TABS tablet Take 1 tablet by mouth daily.   naproxen sodium (ALEVE) 220 MG tablet Take 220-440 mg by mouth 2 (two) times daily as needed (pain/headache).    norgestimate-ethinyl estradiol (ORTHO-CYCLEN) 0.25-35 MG-MCG tablet Take 1 tablet by mouth at bedtime.  polyethylene glycol (MIRALAX / GLYCOLAX) packet Take 17 g by mouth daily as needed (constipation). Mix in 8 oz liquid and drink   tretinoin (RETIN-A) 0.025 % cream Apply 1 application topically at bedtime.   Triamcinolone Acetonide (NASACORT ALLERGY 24HR NA) Place into the nose daily.   No facility-administered encounter medications on file as of 01/11/2023.     REVIEW OF SYSTEMS   : All other systems reviewed and negative except where noted in the History of Present Illness.   PHYSICAL EXAM: BP 104/60   Pulse 79   Ht 5\' 5"  (1.651 m)   Wt 136 lb (61.7 kg)   BMI 22.63 kg/m  General: Well developed white female in no acute distress Head: Normocephalic and atraumatic Eyes:  Sclerae anicteric, conjunctiva pink. Ears: Normal auditory acuity Lungs: Clear throughout to auscultation; no W/R/R. Heart: Regular rate and rhythm; no M/R/G. Abdomen: Soft, non-distended.  BS present.  Non-tender. Musculoskeletal: Symmetrical with no gross deformities  Skin: No lesions on visible extremities Extremities: No edema  Neurological: Alert oriented x 4, grossly non-focal Psychological:  Alert and cooperative. Normal mood and affect  ASSESSMENT AND PLAN: *IBS-M: Alternates between loose stool and sometimes some mild constipation.  Symptoms ongoing for years, dating back to when she was in second grade.  Will check celiac labs.  Previous labs including TSH and CRP have been normal.  She will try Benefiber starting with 2 teaspoons in 8 ounces of liquid daily and increasing to twice daily if needed.  Will try lactose-free diet.  We discussed trying an antispasmodic, but she declined for now.  Discussed possibly proceeding with colonoscopy at some point, but no red flag symptoms at this time and symptoms have been extremely longstanding.   CC:  Jeani Sow, MD

## 2023-01-11 NOTE — Patient Instructions (Signed)
Your provider has requested that you go to the basement level for lab work before leaving today. Press "B" on the elevator. The lab is located at the first door on the left as you exit the elevator.  Start Benefiber 2 teaspoons in 8 ounces of liquid daily and may increase to twice daily if needed.   Follow up as needed.   _______________________________________________________  If your blood pressure at your visit was 140/90 or greater, please contact your primary care physician to follow up on this.  _______________________________________________________  If you are age 51 or older, your body mass index should be between 23-30. Your Body mass index is 22.63 kg/m. If this is out of the aforementioned range listed, please consider follow up with your Primary Care Provider.  If you are age 10 or younger, your body mass index should be between 19-25. Your Body mass index is 22.63 kg/m. If this is out of the aformentioned range listed, please consider follow up with your Primary Care Provider.   ________________________________________________________  The [place] [facility] providers would like to encourage you to use [facility] to communicate with providers for non-urgent requests or questions.  Due to long hold times on the telephone, sending your provider a message by [facility] may be a faster and more efficient way to get a response.  Please allow 48 business hours for a response.  Please remember that this is for non-urgent requests.  _______________________________________________________

## 2023-01-11 NOTE — Progress Notes (Signed)
Noted  

## 2023-01-12 LAB — TISSUE TRANSGLUTAMINASE ABS,IGG,IGA
(tTG) Ab, IgA: <1 U/mL
(tTG) Ab, IgG: <1 U/mL

## 2023-01-12 LAB — IGA: Immunoglobulin A: 204 mg/dL (ref 47–310)

## 2023-06-13 ENCOUNTER — Ambulatory Visit: Payer: Managed Care, Other (non HMO) | Admitting: Family Medicine

## 2023-06-13 ENCOUNTER — Other Ambulatory Visit (HOSPITAL_COMMUNITY)
Admission: RE | Admit: 2023-06-13 | Discharge: 2023-06-13 | Disposition: A | Discharge status: Home / Self Care | Payer: Managed Care, Other (non HMO) | Source: Ambulatory Visit | Attending: Family Medicine | Admitting: Family Medicine

## 2023-06-13 VITALS — BP 111/73 | HR 74 | Temp 97.0°F | Resp 16 | Ht 65.0 in | Wt 140.5 lb

## 2023-06-13 DIAGNOSIS — Z124 Encounter for screening for malignant neoplasm of cervix: Secondary | ICD-10-CM | POA: Insufficient documentation

## 2023-06-13 DIAGNOSIS — Z3041 Encounter for surveillance of contraceptive pills: Secondary | ICD-10-CM

## 2023-06-13 DIAGNOSIS — Z01419 Encounter for gynecological examination (general) (routine) without abnormal findings: Secondary | ICD-10-CM | POA: Diagnosis present

## 2023-06-13 DIAGNOSIS — Z0000 Encounter for general adult medical examination without abnormal findings: Secondary | ICD-10-CM | POA: Diagnosis not present

## 2023-06-13 LAB — COMPREHENSIVE METABOLIC PANEL
ALT: 25 U/L (ref 0–35)
AST: 18 U/L (ref 0–37)
Albumin: 4.3 g/dL (ref 3.5–5.2)
Alkaline Phosphatase: 38 U/L — ABNORMAL LOW (ref 39–117)
BUN: 16 mg/dL (ref 6–23)
CO2: 27 meq/L (ref 19–32)
Calcium: 9.1 mg/dL (ref 8.4–10.5)
Chloride: 102 meq/L (ref 96–112)
Creatinine, Ser: 0.81 mg/dL (ref 0.40–1.20)
GFR: 96.95 mL/min (ref >60.00)
Glucose, Bld: 84 mg/dL (ref 70–99)
Potassium: 4 meq/L (ref 3.5–5.1)
Sodium: 135 meq/L (ref 135–145)
Total Bilirubin: 0.4 mg/dL (ref 0.2–1.2)
Total Protein: 7.1 g/dL (ref 6.0–8.3)

## 2023-06-13 LAB — CBC WITH DIFFERENTIAL/PLATELET
Basophils Absolute: 0.1 10*3/uL (ref 0.0–0.1)
Basophils Relative: 0.7 % (ref 0.0–3.0)
Eosinophils Absolute: 0.1 10*3/uL (ref 0.0–0.7)
Eosinophils Relative: 1.4 % (ref 0.0–5.0)
HCT: 38.3 % (ref 36.0–46.0)
Hemoglobin: 12.8 g/dL (ref 12.0–15.0)
Lymphocytes Relative: 30.8 % (ref 12.0–46.0)
Lymphs Abs: 2.2 10*3/uL (ref 0.7–4.0)
MCHC: 33.3 g/dL (ref 30.0–36.0)
MCV: 90.4 fL (ref 78.0–100.0)
Monocytes Absolute: 0.5 10*3/uL (ref 0.1–1.0)
Monocytes Relative: 6.7 % (ref 3.0–12.0)
Neutro Abs: 4.3 10*3/uL (ref 1.4–7.7)
Neutrophils Relative %: 60.4 % (ref 43.0–77.0)
Platelets: 308 10*3/uL (ref 150.0–400.0)
RBC: 4.23 Mil/uL (ref 3.87–5.11)
RDW: 12.7 % (ref 11.5–15.5)
WBC: 7.2 10*3/uL (ref 4.0–10.5)

## 2023-06-13 LAB — LIPID PANEL
Cholesterol: 159 mg/dL (ref 0–200)
HDL: 51.6 mg/dL (ref >39.00)
LDL Cholesterol: 87 mg/dL (ref 0–99)
NonHDL: 107.3
Total CHOL/HDL Ratio: 3
Triglycerides: 101 mg/dL (ref 0.0–149.0)
VLDL: 20.2 mg/dL (ref 0.0–40.0)

## 2023-06-13 LAB — TSH: TSH: 3.12 u[IU]/mL (ref 0.35–5.50)

## 2023-06-13 LAB — HEMOGLOBIN A1C: Hgb A1c MFr Bld: 5.7 % (ref 4.6–6.5)

## 2023-06-13 MED ORDER — NORGESTIMATE-ETH ESTRADIOL 0.25-35 MG-MCG PO TABS
1.0000 | ORAL_TABLET | Freq: Every day | ORAL | 4 refills | Status: AC
Start: 2023-06-13 — End: ?

## 2023-06-13 NOTE — Patient Instructions (Signed)

## 2023-06-13 NOTE — Progress Notes (Signed)
 Labs great except 1.  A1C(3 month average of sugars) is elevated.  This is considered PreDiabetes.  Work on diet-decrease sugars and starches and aim for 30 minutes of exercise 5 days/week to prevent progression to diabetes -just barely elevated but be aware

## 2023-06-13 NOTE — Progress Notes (Signed)
 Phone [phone]   Subjective:   Patient is a 33 y.o. female presenting for annual physical.    Chief Complaint  Patient presents with   Annual Exam    CPE Fasting    Annual-some exercise.  Walks a lot.  G0.  No abn pap.  See problem oriented charting- ROS- ROS: Gen: no fever, chills  Skin: no rash, itching ENT: no ear pain, ear drainage, nasal congestion, rhinorrhea, sinus pressure, sore throat Eyes: no blurry vision, double vision Resp: no cough, wheeze,SOB CV: no CP, palpitations, LE edema,   occ pain across chest(did PT).  When carrying things. GI: no heartburn, n/v, abd pain.  Has mixed IBS-seeing GI.  For 1 yr-intermitt has to wipe a lot and keep wiping GU: no dysuria, urgency, frequency, hematuria MSK: no joint pain, myalgias, back pain.  Occ sore knuckles/knees.   Neuro: no dizziness, weakness, vertigo, +HA w/weather changes Psych: no depression, anxiety, insomnia, SI   The following were reviewed and entered/updated in epic: Past Medical History:  Diagnosis Date   Allergy    Asthma    Elevated LFTs    Gallstones    IBS (irritable bowel syndrome)    Pancreatitis 01/2017   Scoliosis    Patient Active Problem List   Diagnosis Date Noted   Irritable bowel syndrome with both constipation and diarrhea 01/11/2023   Epigastric pain 12/20/2018   Intraabdominal fluid collection    Abnormal liver function tests    Protein-calorie malnutrition, severe 02/14/2017   Abdominal distention    Abdominal fluid collection    Acute pancreatitis 02/08/2017   Gallstones    Elevated LFTs    Past Surgical History:  Procedure Laterality Date   CHOLECYSTECTOMY N/A 02/05/2017   Procedure: LAPAROSCOPIC CHOLECYSTECTOMY WITH INTRAOPERATIVE CHOLANGIOGRAM;  Surgeon: Sim Dryer, MD;  Location: [facility] OR;  Service: General;  Laterality: N/A;   ERCP N/A 02/06/2017   Procedure: ENDOSCOPIC RETROGRADE CHOLANGIOPANCREATOGRAPHY (ERCP);  Surgeon: Albertina Hugger, MD;  Location: [facility]  ENDOSCOPY;  Service: Gastroenterology;  Laterality: N/A;   MOLE REMOVAL     x 2 on back    Family History  Problem Relation Age of Onset   Cancer Mother    Melanoma Mother    Hyperlipidemia Father    Heart disease Maternal Grandmother    Arthritis Maternal Grandmother    Gallstones Maternal Grandfather    Arthritis Paternal Grandmother    Thyroid  cancer Paternal Grandfather    Colon cancer Neg Hx    Stomach cancer Neg Hx    Pancreatic cancer Neg Hx    Liver disease Neg Hx     Medications- reviewed and updated Current Outpatient Medications  Medication Sig Dispense Refill   fexofenadine (ALLEGRA) 60 MG tablet Take 60 mg by mouth daily.     Multiple Vitamin (MULTIVITAMIN WITH MINERALS) TABS tablet Take 1 tablet by mouth daily.     naproxen sodium (ALEVE) 220 MG tablet Take 220-440 mg by mouth 2 (two) times daily as needed (pain/headache).      polyethylene glycol (MIRALAX  / GLYCOLAX ) packet Take 17 g by mouth daily as needed (constipation). Mix in 8 oz liquid and drink     tretinoin (RETIN-A) 0.025 % cream Apply 1 application topically at bedtime.     Triamcinolone Acetonide (NASACORT ALLERGY 24HR NA) Place into the nose daily.     norgestimate -ethinyl estradiol  (ORTHO-CYCLEN) 0.25-35 MG-MCG tablet Take 1 tablet by mouth at bedtime. 84 tablet 4   No current facility-administered medications for this visit.  Allergies-reviewed and updated Allergies  Allergen Reactions   Amoxicillin -Pot Clavulanate Itching    Itching ? If related to complications with gallbladder or with augmentin  Itching ? If related to complications with gallbladder or with augmentin  Itching ? If related to complications with gallbladder or with augmentin  Itching ? If related to complications with gallbladder or with augmentin  Itching ? If related to complications with gallbladder or with augmentin    Minocycline Swelling and Other (See Comments)    Joint pain and swelling, exhaustion Joint pain and  swelling, exhaustion Other reaction(s): Other Joint pain and swelling, exhaustion Joint pain and swelling, exhaustion Joint pain and swelling, exhaustion    Social History   Social History Narrative   [employer]   Objective  Objective:  BP 111/73   Pulse 74   Temp (!) 97 F (36.1 C) (Temporal)   Resp 16   Ht 5\' 5"  (1.651 m)   Wt 140 lb 8 oz (63.7 kg)   LMP 05/19/2023 (Exact Date)   SpO2 99%   BMI 23.38 kg/m  Physical Exam  Gen: WDWN NAD HEENT: NCAT, conjunctiva not injected, sclera nonicteric TM WNL B, OP moist, no exudates  NECK:  supple, no thyromegaly, no nodes, no carotid bruits CARDIAC: RRR, S1S2+, no murmur. DP 2+B LUNGS: CTAB. No wheezes ABDOMEN:  BS+, soft, NTND, No HSM, no masses EXT:  no edema MSK: no gross abnormalities. MS 5/5 all 4 NEURO: A&O x3.  CN II-XII intact.  PSYCH: normal mood. Good eye contact  Breasts: Examined lying and sitting.              Right:   Without masses, retractions,  nipple discharge or axillary adenopathy.               Left:     Without masses, retractions, nipple discharge or axillary adenopathy. Genitourinary              Inguinal/mons:  Normal without inguinal adenopathy             External genitalia:  Normal appearing vulva with no masses, tenderness, or lesions             BUS/Urethra/Skene's glands:  Normal             Vagina:  Normal appearing with normal color and discharge, no lesions             Cervix:  Normal appearing without discharge or lesions.  Ectropic.  Bled a little             Uterus:  Normal in size, shape and contour.  Midline and mobile, nontender             Adnexa/parametria:                           Rt:        Normal in size, without masses or tenderness.                         Lt:        Normal in size, without masses or tenderness.             Anus and perineum: Normal  Chaperone present QJ      Assessment and Plan   Health Maintenance counseling: 1. Anticipatory guidance:  Patient counseled regarding regular dental exams q6 months, eye exams,  avoiding smoking and second hand smoke, limiting alcohol to 1  beverage per day, no illicit drugs.   2. Risk factor reduction:  Advised patient of need for regular exercise and diet rich and fruits and vegetables to reduce risk of heart attack and stroke. Exercise- +.  Wt Readings from Last 3 Encounters:  06/13/23 140 lb 8 oz (63.7 kg)  01/11/23 136 lb (61.7 kg)  06/23/22 138 lb 2 oz (62.7 kg)   3. Immunizations/screenings/ancillary studies Immunization History  Administered Date(s) Administered   HPV 9-valent 02/25/2007, 04/24/2007, 10/12/2007   Influenza Split 03/01/2009   Influenza, Quadrivalent, Recombinant, Inj, Pf 03/12/2020, 03/10/2022   Influenza, Seasonal, Injecte, Preservative Fre 03/08/2017, 03/15/2019   Influenza,inj,Quad PF,6+ Mos 04/01/2021   Influenza,trivalent, recombinat, inj, PF 03/01/2009   Influenza-Unspecified 03/08/2017, 03/15/2019, 03/26/2020   Meningococcal Conjugate 10/24/2005, 11/23/2010   Moderna Covid-19 Vaccine Bivalent Booster 60yrs & up 04/01/2021   Moderna Sars-Covid-2 Vaccination 03/10/2022   PFIZER(Purple Top)SARS-COV-2 Vaccination 08/23/2019, 09/16/2019, 04/24/2020   Tdap 02/25/2007, 01/15/2017   Health Maintenance Due  Topic Date Due   Cervical Cancer Screening (HPV/Pap Cotest)  07/15/2023    4. Cervical cancer screening: +done today 5. Skin cancer screening- advised regular sunscreen use. Denies worrisome, changing, or new skin lesions.  6. Birth control/STD check: ocp 7. Smoking associated screening: non smoker 8. Alcohol screening: neg  Well woman exam with routine gynecological exam -     CBC with Differential/Platelet -     Comprehensive metabolic panel -     Lipid panel -     TSH -     Hemoglobin A1c -     Cytology - PAP  Encounter for birth control pills maintenance -     Norgestimate -Eth Estradiol ; Take 1 tablet by mouth at bedtime.  Dispense: 84 tablet; Refill:  4  Screening for cervical cancer -     Cytology - PAP   Wellness-anticipatory guidance.  Work on Diet/Exercise  Check CBC,CMP,lipids,TSH, A1C.  F/u 1 yr  pap done today  Recommended follow up: Return in about 1 year (around 06/12/2024) for annual physical.  Lab/Order associations:+ fasting   Ellsworth Haas, MD

## 2023-06-18 ENCOUNTER — Encounter: Payer: Self-pay | Admitting: Family Medicine

## 2023-06-18 LAB — CYTOLOGY - PAP
Comment: NEGATIVE
Diagnosis: NEGATIVE
High risk HPV: NEGATIVE

## 2024-09-02 ENCOUNTER — Encounter: Admitting: Family Medicine
